# Patient Record
Sex: Male | Born: 1940 | Race: White | Hispanic: No | Marital: Married | Smoking: Former smoker
Health system: Southern US, Community
[De-identification: ages and names within clinical notes are randomized; demographics above are authoritative.]

## PROBLEM LIST (undated history)

## (undated) DIAGNOSIS — K5790 Diverticulosis of intestine, part unspecified, without perforation or abscess without bleeding: Secondary | ICD-10-CM

## (undated) DIAGNOSIS — T7840XA Allergy, unspecified, initial encounter: Secondary | ICD-10-CM

## (undated) DIAGNOSIS — I499 Cardiac arrhythmia, unspecified: Secondary | ICD-10-CM

## (undated) DIAGNOSIS — M1990 Unspecified osteoarthritis, unspecified site: Secondary | ICD-10-CM

## (undated) DIAGNOSIS — I4891 Unspecified atrial fibrillation: Secondary | ICD-10-CM

## (undated) DIAGNOSIS — N138 Other obstructive and reflux uropathy: Secondary | ICD-10-CM

## (undated) DIAGNOSIS — K4090 Unilateral inguinal hernia, without obstruction or gangrene, not specified as recurrent: Secondary | ICD-10-CM

## (undated) DIAGNOSIS — G4733 Obstructive sleep apnea (adult) (pediatric): Secondary | ICD-10-CM

## (undated) DIAGNOSIS — H269 Unspecified cataract: Secondary | ICD-10-CM

## (undated) DIAGNOSIS — E669 Obesity, unspecified: Secondary | ICD-10-CM

## (undated) DIAGNOSIS — J449 Chronic obstructive pulmonary disease, unspecified: Secondary | ICD-10-CM

## (undated) DIAGNOSIS — G4730 Sleep apnea, unspecified: Secondary | ICD-10-CM

## (undated) DIAGNOSIS — J45909 Unspecified asthma, uncomplicated: Secondary | ICD-10-CM

## (undated) DIAGNOSIS — R739 Hyperglycemia, unspecified: Secondary | ICD-10-CM

## (undated) DIAGNOSIS — G709 Myoneural disorder, unspecified: Secondary | ICD-10-CM

## (undated) DIAGNOSIS — I10 Essential (primary) hypertension: Secondary | ICD-10-CM

## (undated) DIAGNOSIS — K219 Gastro-esophageal reflux disease without esophagitis: Secondary | ICD-10-CM

## (undated) DIAGNOSIS — N401 Enlarged prostate with lower urinary tract symptoms: Secondary | ICD-10-CM

## (undated) DIAGNOSIS — E785 Hyperlipidemia, unspecified: Secondary | ICD-10-CM

## (undated) HISTORY — DX: Hyperlipidemia, unspecified: E78.5

## (undated) HISTORY — DX: Other obstructive and reflux uropathy: N13.8

## (undated) HISTORY — DX: Sleep apnea, unspecified: G47.30

## (undated) HISTORY — DX: Essential (primary) hypertension: I10

## (undated) HISTORY — PX: APPENDECTOMY: SHX54

## (undated) HISTORY — PX: POLYPECTOMY: SHX149

## (undated) HISTORY — DX: Unspecified asthma, uncomplicated: J45.909

## (undated) HISTORY — DX: Obesity, unspecified: E66.9

## (undated) HISTORY — PX: TONSILLECTOMY AND ADENOIDECTOMY: SUR1326

## (undated) HISTORY — PX: OTHER SURGICAL HISTORY: SHX169

## (undated) HISTORY — DX: Diverticulosis of intestine, part unspecified, without perforation or abscess without bleeding: K57.90

## (undated) HISTORY — DX: Unilateral inguinal hernia, without obstruction or gangrene, not specified as recurrent: K40.90

## (undated) HISTORY — PX: HAMMER TOE SURGERY: SHX385

## (undated) HISTORY — DX: Myoneural disorder, unspecified: G70.9

## (undated) HISTORY — DX: Benign prostatic hyperplasia with lower urinary tract symptoms: N40.1

## (undated) HISTORY — DX: Unspecified osteoarthritis, unspecified site: M19.90

## (undated) HISTORY — PX: COLONOSCOPY: SHX174

## (undated) HISTORY — DX: Allergy, unspecified, initial encounter: T78.40XA

## (undated) HISTORY — DX: Hyperglycemia, unspecified: R73.9

## (undated) HISTORY — DX: Gastro-esophageal reflux disease without esophagitis: K21.9

## (undated) HISTORY — PX: INGUINAL HERNIA REPAIR: SUR1180

## (undated) HISTORY — DX: Obstructive sleep apnea (adult) (pediatric): G47.33

## (undated) HISTORY — DX: Unspecified cataract: H26.9

---

## 2000-01-26 ENCOUNTER — Encounter: Payer: Self-pay | Admitting: *Deleted

## 2000-01-26 ENCOUNTER — Encounter: Admission: RE | Admit: 2000-01-26 | Discharge: 2000-01-26 | Payer: Self-pay | Admitting: *Deleted

## 2000-04-23 ENCOUNTER — Ambulatory Visit (HOSPITAL_COMMUNITY): Admission: RE | Admit: 2000-04-23 | Discharge: 2000-04-23 | Payer: Self-pay

## 2002-05-20 ENCOUNTER — Ambulatory Visit (HOSPITAL_COMMUNITY): Admission: RE | Admit: 2002-05-20 | Discharge: 2002-05-20 | Payer: Self-pay | Admitting: Orthopedic Surgery

## 2002-05-20 ENCOUNTER — Encounter: Payer: Self-pay | Admitting: Orthopedic Surgery

## 2005-03-20 HISTORY — PX: TOTAL HIP ARTHROPLASTY: SHX124

## 2005-05-31 ENCOUNTER — Inpatient Hospital Stay (HOSPITAL_COMMUNITY): Admission: RE | Admit: 2005-05-31 | Discharge: 2005-06-03 | Payer: Self-pay | Admitting: Orthopedic Surgery

## 2007-07-05 ENCOUNTER — Encounter: Admission: RE | Admit: 2007-07-05 | Discharge: 2007-07-05 | Payer: Self-pay | Admitting: Internal Medicine

## 2008-10-05 ENCOUNTER — Ambulatory Visit: Payer: Self-pay | Admitting: Vascular Surgery

## 2009-10-28 ENCOUNTER — Emergency Department (HOSPITAL_COMMUNITY): Admission: EM | Admit: 2009-10-28 | Discharge: 2009-10-28 | Payer: Self-pay | Admitting: Emergency Medicine

## 2010-03-20 HISTORY — PX: CATARACT EXTRACTION: SUR2

## 2010-08-05 NOTE — Discharge Summary (Signed)
NAME:  Noah Huffman, Noah Huffman NO.:  [id number]   MEDICAL RECORD NO.:  [id number]          PATIENT TYPE:  INP   LOCATION:  1518                         FACILITY:  [facility]   PHYSICIAN:  Ollen Gross, M.D.    DATE OF BIRTH:  01/05/1941   DATE OF ADMISSION:  05/31/2005  DATE OF DISCHARGE:  06/03/2005                                 DISCHARGE SUMMARY   ADMITTING DIAGNOSES:  1.  Osteoarthritis, right hip.  2.  Hypertension.   DISCHARGE DIAGNOSES:  1.  Osteoarthritis, right hip, status post right total hip arthroplasty.  2.  Postoperative blood loss anemia, did not require transfusion.  3.  Hypertension.   PROCEDURE:  On May 30, 2005, right total hip arthroplasty.  Surgeon:  Ollen Gross, M.D.  Assistant:  A. Perkins, PA-C.  Spinal anesthesia.  Blood loss:  500 cc.   CONSULTS:  None.   BRIEF HISTORY:  Noah Huffman is a 70 year old male with end-stage  osteoarthritis of the right hip, failed management and now presents for  total hip arthroplasty.   LABORATORY DATA:  Hemoglobin 14.9 preop with a hematocrit of 43, normal  white count 7.1.  Postop hemoglobin 11.6, stabilized at 11.7, but drifted  down further.  Last H&H was 10.7 and 30.9.  PT and PTT 13.7 and 33,  respectively.  INR 1.  Serial pro times were followed and last-noted PT and  INR 16.3 and 1.3.  Chem panel on admission all within normal limits.  Serial  CMPs were followed.  Electrolytes remained within normal limits.  Urinalysis  preop negative.  Blood group type O positive.  EKG dated May 26, 2005,  normal sinus rhythm, left axis deviation.  No old tracing to compare.  Confirmed by Dr. Jerral Bonito.  Chest x-ray May 26, 2005, heart and lungs were  unremarkable.  No active disease.  Hip films May 26, 2005, moderately  advanced degenerative changes of the right hip.  Postop hip and pelvis  films:  Right hip arthroplasty without apparent complications.   HOSPITAL COURSE:  Admitted to [facility].   Tolerated the procedure  well.  Later transferred to the recovery room and orthopedic floor for  continued postop care.  The patient was placed on PCA and p.o. analgesics  for pain control following surgery.  He did have some itching which was  possibly felt due to the PCA which was discontinued, switched over to  Vicodin.  Given Benadryl.  He had some low urinary output, so given some  fluid bolus and monitored the output.  Output responded very well, had  excellent output by the following day.  Hemovac drain which had been placed  at the time of surgery was pulled on day one.  By day two, the urine output  responded very well, had gotten up out of bed on day one.  Then, by day two,  he was ambulating 70 and 100 feet, respectively, with physical therapy.  He  did well on day two, feeling good, had been weaned over to p.o. meds.  Itching had improved.  He was ready to go  home by the following day of June 03, 2005.   DISCHARGE PLAN:  The patient was discharged home on June 03, 2005.   DISCHARGE DIAGNOSES:  Please see above.   DISCHARGE MEDICATIONS:  Coumadin, Robaxin, Vicodin.   DIET:  As tolerated.   ACTIVITY:  Partial weightbearing 25% to 50% right lower extremity.  Home  health PT and home health nursing, total hip protocol.   FOLLOWUP:  Follow up in two weeks.   DISPOSITION:  Home.   CONDITION ON DISCHARGE:  Improved.      Alexzandrew L. Julien Girt, P.A.      Ollen Gross, M.D.  Electronically Signed    ALP/MEDQ  D:  07/19/2005  T:  07/20/2005  Job:  [id number]   cc:   Gwen Pounds, MD  Fax: [phone]

## 2010-08-05 NOTE — Op Note (Signed)
NAME:  RIGO, LETTS NO.:  [id number]   MEDICAL RECORD NO.:  [id number]          PATIENT TYPE:  INP   LOCATION:  X005                         FACILITY:  [facility]   PHYSICIAN:  Ollen Gross, M.D.    DATE OF BIRTH:  Feb 12, 1941   DATE OF PROCEDURE:  05/31/2005  DATE OF DISCHARGE:                                 OPERATIVE REPORT   PREOPERATIVE DIAGNOSIS:  Osteoarthritis right hip.   POSTOPERATIVE DIAGNOSIS:  Osteoarthritis right hip.   PROCEDURE:  Right total hip arthroplasty.   SURGEON:  Ollen Gross, M.D.   ASSISTANT:  Avel Peace, PA-C.   ANESTHESIA:  Spinal.   ESTIMATED BLOOD LOSS:  500.   DRAINS:  Hemovac x1.   COMPLICATIONS:  None.   CONDITION:  Stable to recovery.   CLINICAL NOTE:  Mr. Slatter is a 70 year old male with end-stage  osteoarthritis of the right hip. He has failed nonoperative management and  presents now for total hip arthroplasty.   PROCEDURE IN DETAIL:  After successful administration of spinal anesthetic,  the patient is placed in the left lateral decubitus position with the right  side up and held with the hip positioner. The right lower extremity is  isolated from his perineum with plastic drapes and prepped and draped in the  usual sterile fashion. A standard posterolateral incision is made with a 10  blade through the subcutaneous tissue to the level of the fascia lata which  is incised in line with the skin incision. The sciatic nerve is palpated and  protected and the short rotators isolated off the femur. Capsulectomy is  performed and the hip is dislocated. The center of the femoral head is  marked and the trial prosthesis placed such that the center of the trial  head corresponds to the center of his native femoral head. The osteotomy  line is marked on the femoral neck and osteotomy made with an oscillating  saw. The femoral head is removed and then the femur retracted anteriorly to  gain acetabular exposure.   Acetabular reaming starts at 47 coursing in increments of 2 mm up to 61 mm  and then a 62 mm pinnacle acetabular shell was placed in anatomic position  and transfixed with two dome screws. A trial 36 mm neutral liner was placed.   The femur was repaired with the canal finder and irrigation. Axial reaming  is performed up to 13.5 mm, proximal reaming to a 34F oversize for an 18 and  the sleeve machined to an extra extra large. The trial sleeve is placed with  the 18 x 13 stem and a 36 plus 12 neck about 10 degrees beyond his native  anteversion which was slightly beyond neutral. A 36 plus zero head was  placed at first. The plus zero was too easy to reduce. We went on up to a +6  which lead to a very stable reduction. By placing the right leg on top of  the left, I still felt we needed a few more millimeters to get leg length  equality. We placed a +9 femoral head. With the +9,  we had great soft tissue  tension. He had great stability with full extension, full external rotation,  70 degrees flexion, 40 degrees adduction and 90 degrees of internal rotation  and 90 degrees of flexion and about 70 degrees of internal rotation. The hip  is dislocated and all trials are removed. The permanent apex hole eliminator  is placed into the acetabular shell and the permanent 36 mm neutral Ultamet  metal liner is placed. This was a metal-on-metal hip replacement. The 37F  extra extra large sleeve is placed. This is the oversize sleeve for the 30F.  The sleeve is impacted and the 18 x 13 stem with a 36 plus 12 neck is placed  again about 10 mm beyond his native anteversion. The 36 plus 9 head is  placed and the hip is reduced with the same stability parameters. The wound  was copiously irrigated with saline solution and the short rotators  reattached to the femur through drill holes. The fascia lata was closed over  a Hemovac drain with interrupted #1 Vicryl, subcu closed with #1 and 2-0  Vicryl and  subcuticular with running 4-0 Monocryl. The incision is cleaned  and dried and Steri-Strips and a bulky sterile dressing applied. The drain  is hooked to suction. He is placed into a knee immobilizer, awakened and  transported to recovery in stable condition.      Ollen Gross, M.D.  Electronically Signed     FA/MEDQ  D:  05/31/2005  T:  06/01/2005  Job:  [id number]

## 2010-08-05 NOTE — Op Note (Signed)
[facility]  Patient:    Noah Huffman, Noah Huffman                      MRN: [id number] Proc. Date: 04/23/00 Adm. Date:  [id number] Disc. Date: [id number] Attending:  Meredith Leeds                           Operative Report  PREOPERATIVE DIAGNOSIS:  Right inguinal hernia.  POSTOPERATIVE DIAGNOSIS:  Right inguinal hernia.  OPERATION:  Repair of right inguinal hernia.  SURGEON:  Zigmund Daniel, M.D.  ANESTHESIA:  Local with sedation and monitoring by anesthesia.  DESCRIPTION OF PROCEDURE:  After the patient was adequately monitored and sedated, and after routine preparation and draping of the right inguinal region, I liberally infused local 0.5% bupivacaine with epinephrine in the operative field.  As I deepened my dissection I gave more local anesthetic. He was comfortable throughout.  I made an oblique incision beginning just at the pubic tubercle, going laterally for about 5 cm; and I dissected down through the fat into the area of the external oblique and exposed it until I could identify the external ring.  I opened the external oblique in the direction of its fibers, into the external ring and mobilized and controlled the spermatic cord.  I took care to avoid injury to the ilioinguinal and iliohypogastric nerves.  I saw that he had a direct hernia, separated it from the cord.  I dissected the cord a little bit in its proximal part and excluded an indirect hernia.  I then reduced the hernia and held it in place with a plug of polypropylene mesh, held in place with a running 2-0 silk stitch.  I fashioned a patch of polypropylene mesh to fit on the inguinal floor with a slit made in it for egress of the spermatic cord.  I sewed it in place from the pubic tubercle medially and superiorly with a basting stitch in the internal oblique fascia, and laterally and inferiorly with a running stitch in the inguinal ligament.  I used a single suture to join  the tails of the mesh together just lateral to the spermatic cord.  I felt this provided an excellent hernia repair.  I used 2-0 Prolene suture to sew in the mesh.  I put in a little further local anesthetic and assured good hemostasis, then closed the wound.  I used 3-0 Vicryl to close the external oblique and subcutaneous tissues, and 4-0 Vicryl to intracuticular suture to approximate the skin; and I reinforced that with Steri-Strips.  After application of bandage, he went to PACU in stable condition. DD:  04/23/00 TD:  04/23/00 Job: [id number]

## 2010-08-05 NOTE — H&P (Signed)
NAME:  Noah Huffman, Noah Huffman NO.:  [id number]   MEDICAL RECORD NO.:  [id number]          PATIENT TYPE:  INP   LOCATION:  1518                         FACILITY:  [facility]   PHYSICIAN:  Ollen Gross, M.D.    DATE OF BIRTH:  1940/03/27   DATE OF ADMISSION:  05/31/2005  DATE OF DISCHARGE:  06/03/2005                                HISTORY & PHYSICAL   CHIEF COMPLAINT:  Right hip pain.   HISTORY OF PRESENT ILLNESS:  Patient is a 70 year old male who has had  progressively worsening problems in the right hip.  He has been seen and  found to have significant arthritis.  He has been tolerating it fairly well.  Unfortunately, now has progressed to the point where it is interfering with  his daily activities and quality of life and would like to have something  more definitive done about it.  He is known to have end-stage arthritis, and  it is felt that he would benefit from undergoing hip replacement.  The risks  and benefits discussed.  Patient was subsequently admitted to the hospital.   ALLERGIES:  No known drug allergies.   CURRENT MEDICATIONS:  Lisinopril 10 mg daily.   PAST MEDICAL HISTORY:  Hypertension.   PAST SURGICAL HISTORY:  1.  Hernia surgery.  2.  Appendectomy.  3.  Hammertoe surgery.  4.  Arthroscopic knee surgery.   SOCIAL HISTORY:  Married.  Two children.  Two grandchildren.  Works in  Advertising account executive.  Still very active with work.   FAMILY HISTORY:  Father deceased, age 77, with a questionable history of  stroke and leukemia.  Mother living, age 64, good health.   REVIEW OF SYSTEMS:  GENERAL:  No fevers, chills, night sweats.  NEURO:  No  seizures, syncope, paralysis.  RESPIRATORY:  No shortness of breath,  productive cough, or hemoptysis.  CARDIOVASCULAR:  No chest pain, angina, or  orthopnea.  GI:  No nausea, vomiting, diarrhea, or constipation.  GU:  No  dysuria, hematuria, or discharge.  MUSCULOSKELETAL:  Right hip.   PHYSICAL EXAMINATION:   VITAL SIGNS:  Pulse 64, respirations 12, blood  pressure 134/70.  GENERAL:  A 70 year old white male, well-nourished, well-developed, in no  acute distress.  Large frame.  Slightly overweight.  HEENT:  Normocephalic and atraumatic.  Pupils are round and reactive.  EOMs  intact.  Oropharynx is clear.  NECK:  Supple.  CHEST:  Clear.  HEART:  Regular rate and rhythm without murmur.  S1 and S2 noted.  ABDOMEN:  Soft, slightly round.  Bowel sounds present.  RECTAL/BREASTS/GENITALIA:  Not done.  Not pertinent to the present illness.  EXTREMITIES:  Right hip:  The right hip shows flexion to 95, internal  rotation of 10, external rotation of 20, abduction of 30, antalgic  gait/ambulation.   IMPRESSION:  1.  Osteoarthritis, right hip.  2.  Hypertension.   PLAN:  Patient admitted to [facility] to undergone a right total  hip arthroplasty.  Surgery will be performed by Dr. Trudee Grip.      Alexzandrew L. Perkins, P.A.  Ollen Gross, M.D.  Electronically Signed    ALP/MEDQ  D:  06/18/2005  T:  06/20/2005  Job:  [id number]   cc:   Gwen Pounds, MD  Fax: [phone]

## 2011-04-06 DIAGNOSIS — Z125 Encounter for screening for malignant neoplasm of prostate: Secondary | ICD-10-CM | POA: Diagnosis not present

## 2011-04-06 DIAGNOSIS — I10 Essential (primary) hypertension: Secondary | ICD-10-CM | POA: Diagnosis not present

## 2011-04-06 DIAGNOSIS — E785 Hyperlipidemia, unspecified: Secondary | ICD-10-CM | POA: Diagnosis not present

## 2011-04-06 DIAGNOSIS — K219 Gastro-esophageal reflux disease without esophagitis: Secondary | ICD-10-CM | POA: Diagnosis not present

## 2011-04-13 DIAGNOSIS — R7309 Other abnormal glucose: Secondary | ICD-10-CM | POA: Diagnosis not present

## 2011-04-13 DIAGNOSIS — Z0000 Encounter for general adult medical examination without abnormal findings: Secondary | ICD-10-CM | POA: Diagnosis not present

## 2011-04-13 DIAGNOSIS — I10 Essential (primary) hypertension: Secondary | ICD-10-CM | POA: Diagnosis not present

## 2011-04-13 DIAGNOSIS — R062 Wheezing: Secondary | ICD-10-CM | POA: Diagnosis not present

## 2011-04-14 DIAGNOSIS — Z1212 Encounter for screening for malignant neoplasm of rectum: Secondary | ICD-10-CM | POA: Diagnosis not present

## 2011-07-04 DIAGNOSIS — H04129 Dry eye syndrome of unspecified lacrimal gland: Secondary | ICD-10-CM | POA: Diagnosis not present

## 2011-08-09 ENCOUNTER — Other Ambulatory Visit (HOSPITAL_COMMUNITY): Payer: Self-pay | Admitting: Internal Medicine

## 2011-08-09 DIAGNOSIS — R062 Wheezing: Secondary | ICD-10-CM

## 2011-08-16 ENCOUNTER — Ambulatory Visit (HOSPITAL_COMMUNITY)
Admission: RE | Admit: 2011-08-16 | Discharge: 2011-08-16 | Disposition: A | Discharge status: Home / Self Care | Payer: Medicare Other | Source: Ambulatory Visit | Attending: Internal Medicine | Admitting: Internal Medicine

## 2011-08-16 ENCOUNTER — Other Ambulatory Visit (HOSPITAL_COMMUNITY): Payer: Self-pay | Admitting: Internal Medicine

## 2011-08-16 DIAGNOSIS — R062 Wheezing: Secondary | ICD-10-CM | POA: Diagnosis not present

## 2011-08-16 DIAGNOSIS — R05 Cough: Secondary | ICD-10-CM | POA: Diagnosis not present

## 2011-08-16 DIAGNOSIS — R059 Cough, unspecified: Secondary | ICD-10-CM | POA: Diagnosis not present

## 2011-08-16 MED ORDER — ALBUTEROL SULFATE (5 MG/ML) 0.5% IN NEBU
2.5000 mg | INHALATION_SOLUTION | Freq: Once | RESPIRATORY_TRACT | Status: AC
  Administered 2011-08-16: 2.5 mg via RESPIRATORY_TRACT

## 2011-08-23 DIAGNOSIS — R059 Cough, unspecified: Secondary | ICD-10-CM | POA: Diagnosis not present

## 2011-08-23 DIAGNOSIS — R062 Wheezing: Secondary | ICD-10-CM | POA: Diagnosis not present

## 2011-08-23 DIAGNOSIS — J209 Acute bronchitis, unspecified: Secondary | ICD-10-CM | POA: Diagnosis not present

## 2011-08-23 DIAGNOSIS — R05 Cough: Secondary | ICD-10-CM | POA: Diagnosis not present

## 2011-10-11 DIAGNOSIS — R7309 Other abnormal glucose: Secondary | ICD-10-CM | POA: Diagnosis not present

## 2011-10-11 DIAGNOSIS — E785 Hyperlipidemia, unspecified: Secondary | ICD-10-CM | POA: Diagnosis not present

## 2011-10-11 DIAGNOSIS — I10 Essential (primary) hypertension: Secondary | ICD-10-CM | POA: Diagnosis not present

## 2011-10-11 DIAGNOSIS — R062 Wheezing: Secondary | ICD-10-CM | POA: Diagnosis not present

## 2012-01-22 DIAGNOSIS — K219 Gastro-esophageal reflux disease without esophagitis: Secondary | ICD-10-CM | POA: Diagnosis not present

## 2012-01-22 DIAGNOSIS — I10 Essential (primary) hypertension: Secondary | ICD-10-CM | POA: Diagnosis not present

## 2012-01-22 DIAGNOSIS — J309 Allergic rhinitis, unspecified: Secondary | ICD-10-CM | POA: Diagnosis not present

## 2012-01-22 DIAGNOSIS — R05 Cough: Secondary | ICD-10-CM | POA: Diagnosis not present

## 2012-01-22 DIAGNOSIS — R059 Cough, unspecified: Secondary | ICD-10-CM | POA: Diagnosis not present

## 2012-02-22 DIAGNOSIS — I10 Essential (primary) hypertension: Secondary | ICD-10-CM | POA: Diagnosis not present

## 2012-02-22 DIAGNOSIS — J309 Allergic rhinitis, unspecified: Secondary | ICD-10-CM | POA: Diagnosis not present

## 2012-02-22 DIAGNOSIS — R05 Cough: Secondary | ICD-10-CM | POA: Diagnosis not present

## 2012-02-22 DIAGNOSIS — R059 Cough, unspecified: Secondary | ICD-10-CM | POA: Diagnosis not present

## 2012-02-22 DIAGNOSIS — K219 Gastro-esophageal reflux disease without esophagitis: Secondary | ICD-10-CM | POA: Diagnosis not present

## 2012-02-26 DIAGNOSIS — M7500 Adhesive capsulitis of unspecified shoulder: Secondary | ICD-10-CM | POA: Diagnosis not present

## 2012-04-01 DIAGNOSIS — M25569 Pain in unspecified knee: Secondary | ICD-10-CM | POA: Diagnosis not present

## 2012-04-04 DIAGNOSIS — M25569 Pain in unspecified knee: Secondary | ICD-10-CM | POA: Diagnosis not present

## 2012-04-08 DIAGNOSIS — M25569 Pain in unspecified knee: Secondary | ICD-10-CM | POA: Diagnosis not present

## 2012-04-10 DIAGNOSIS — R7309 Other abnormal glucose: Secondary | ICD-10-CM | POA: Diagnosis not present

## 2012-04-10 DIAGNOSIS — Z125 Encounter for screening for malignant neoplasm of prostate: Secondary | ICD-10-CM | POA: Diagnosis not present

## 2012-04-10 DIAGNOSIS — I10 Essential (primary) hypertension: Secondary | ICD-10-CM | POA: Diagnosis not present

## 2012-04-10 DIAGNOSIS — E785 Hyperlipidemia, unspecified: Secondary | ICD-10-CM | POA: Diagnosis not present

## 2012-04-11 DIAGNOSIS — M25569 Pain in unspecified knee: Secondary | ICD-10-CM | POA: Diagnosis not present

## 2012-04-15 DIAGNOSIS — M25569 Pain in unspecified knee: Secondary | ICD-10-CM | POA: Diagnosis not present

## 2012-04-18 ENCOUNTER — Encounter: Payer: Self-pay | Admitting: Internal Medicine

## 2012-04-18 DIAGNOSIS — Z125 Encounter for screening for malignant neoplasm of prostate: Secondary | ICD-10-CM | POA: Diagnosis not present

## 2012-04-18 DIAGNOSIS — K219 Gastro-esophageal reflux disease without esophagitis: Secondary | ICD-10-CM | POA: Diagnosis not present

## 2012-04-18 DIAGNOSIS — Z1331 Encounter for screening for depression: Secondary | ICD-10-CM | POA: Diagnosis not present

## 2012-04-18 DIAGNOSIS — Z0000 Encounter for general adult medical examination without abnormal findings: Secondary | ICD-10-CM | POA: Diagnosis not present

## 2012-04-18 DIAGNOSIS — Z1212 Encounter for screening for malignant neoplasm of rectum: Secondary | ICD-10-CM | POA: Diagnosis not present

## 2012-04-19 DIAGNOSIS — M25569 Pain in unspecified knee: Secondary | ICD-10-CM | POA: Diagnosis not present

## 2012-04-24 DIAGNOSIS — M25569 Pain in unspecified knee: Secondary | ICD-10-CM | POA: Diagnosis not present

## 2012-04-26 DIAGNOSIS — M25569 Pain in unspecified knee: Secondary | ICD-10-CM | POA: Diagnosis not present

## 2012-05-01 DIAGNOSIS — M25569 Pain in unspecified knee: Secondary | ICD-10-CM | POA: Diagnosis not present

## 2012-05-06 DIAGNOSIS — M25569 Pain in unspecified knee: Secondary | ICD-10-CM | POA: Diagnosis not present

## 2012-05-09 DIAGNOSIS — M25569 Pain in unspecified knee: Secondary | ICD-10-CM | POA: Diagnosis not present

## 2012-05-13 DIAGNOSIS — M25569 Pain in unspecified knee: Secondary | ICD-10-CM | POA: Diagnosis not present

## 2012-05-15 ENCOUNTER — Ambulatory Visit (INDEPENDENT_AMBULATORY_CARE_PROVIDER_SITE_OTHER): Payer: Medicare Other | Admitting: Internal Medicine

## 2012-05-15 ENCOUNTER — Encounter: Payer: Self-pay | Admitting: Internal Medicine

## 2012-05-15 VITALS — BP 138/88 | HR 89 | Ht 76.0 in | Wt 284.0 lb

## 2012-05-15 DIAGNOSIS — Z1211 Encounter for screening for malignant neoplasm of colon: Secondary | ICD-10-CM | POA: Diagnosis not present

## 2012-05-15 DIAGNOSIS — K219 Gastro-esophageal reflux disease without esophagitis: Secondary | ICD-10-CM | POA: Diagnosis not present

## 2012-05-15 MED ORDER — MOVIPREP 100 G PO SOLR: 1.0000 | Freq: Once | ORAL | Status: DC

## 2012-05-15 NOTE — Progress Notes (Signed)
HISTORY OF PRESENT ILLNESS:  Noah Huffman is a 72 y.o. male with hyperlipidemia, hypertension, obesity, and sleep apnea. He presents today regarding possible GERD and the need for colonoscopy. Patient reports developing issues with wheezing and upper respiratory symptoms late last fall. He was initially treated as an upper respiratory illness with improvement. Subsequently developed problems with regurgitation, pyrosis, and water brash. As well, lump-like sensation in the mid chest. He was initially placed on OTC acid reducing agents with improvement. Subsequently placed on Dexilant 60 mg daily, last month. On the medication his symptoms resolved. He has now been off the medication for 2 days. He describes thick phlegm like sensation in the throat, but no true esophageal dysphagia. Occasional nausea. No vomiting. GI review of systems is otherwise negative. He states that he had a colonoscopy about 8 or 10 years ago, at least. He is not sure where with whom. No pathology to report. His mother was diagnosed with colon cancer at advance age (43). He was advised he is due for followup colonoscopy. He is active, and his chronic medical problems stable  REVIEW OF SYSTEMS:  All non-GI ROS negative except for  Past Medical History  Diagnosis Date  . Allergic rhinitis   . Hyperglycemia   . Hyperlipidemia   . GERD (gastroesophageal reflux disease)   . BPH (benign prostatic hypertrophy) with urinary obstruction   . Hypertension   . Obesity   . Osteoarthritis   . Sleep apnea   . Inguinal hernia   . Diverticulosis     Past Surgical History  Procedure Laterality Date  . Hammer toe surgery    . Inguinal hernia repair      right  . Arthroscopic knee surgery    . Appendectomy    . Tonsillectomy and adenoidectomy    . Total hip arthroplasty  2007    right  . Cataract extraction  2012    bilateral    Social History KMARION RAWL  reports that he quit smoking about 34 years ago. His smoking  use included Cigarettes. He smoked 0.00 packs per day. He has never used smokeless tobacco. He reports that  drinks alcohol. His drug history is not on file.  family history includes Colon cancer (age of onset: 38) in his mother; Leukemia in his father; Pneumonia in his father; and Transient ischemic attack in his father.  No Known Allergies     PHYSICAL EXAMINATION: Vital signs: BP 138/88  Pulse 89  Ht 6\' 4"  (1.93 m)  Wt 284 lb (128.822 kg)  BMI 34.58 kg/m2  SpO2 98%  Constitutional: generally well-appearing, no acute distress Psychiatric: alert and oriented x3, cooperative Eyes: extraocular movements intact, anicteric, conjunctiva pink Mouth: oral pharynx moist, no lesions Neck: supple no lymphadenopathy Cardiovascular: heart regular rate and rhythm, no murmur Lungs: clear to auscultation bilaterally Abdomen: soft, nontender, nondistended, no obvious ascites, no peritoneal signs, normal bowel sounds, no organomegaly Rectal: Deferred until colonoscopy Extremities: no lower extremity edema bilaterally Skin: no lesions on visible extremities Neuro: No focal deficits. No asterixis.    ASSESSMENT:  #1. GERD. Symptoms improved on PPI. Now off PPI for 2 days #2. Family history of colon cancer. Due for followup screening   PLAN:  #1. Reflux precautions #2. Nexium samples given. A patient were to have recurrent symptoms off PPI, then resume PPI. May need prescription long-term. Await the results of endoscopy. #3. Diagnostic upper endoscopy.The nature of the procedure, as well as the risks, benefits, and alternatives were carefully  and thoroughly reviewed with the patient. Ample time for discussion and questions allowed. The patient understood, was satisfied, and agreed to proceed. #4. Screening colonoscopy.The nature of the procedure, as well as the risks, benefits, and alternatives were carefully and thoroughly reviewed with the patient. Ample time for discussion and questions  allowed. The patient understood, was satisfied, and agreed to proceed. Movi prep prescribed. The patient instructed on its use

## 2012-05-15 NOTE — Patient Instructions (Addendum)
You have been scheduled for an endoscopy and colonoscopy with propofol. Please follow the written instructions given to you at your visit today. Please pick up your prep at the pharmacy within the next 1-3 days. If you use inhalers (even only as needed) or a CPAP machine, please bring them with you on the day of your procedure.   You have been given you some samples of Nexium to use, take one a day 30 minutes before breakfast

## 2012-05-17 DIAGNOSIS — M25569 Pain in unspecified knee: Secondary | ICD-10-CM | POA: Diagnosis not present

## 2012-05-20 DIAGNOSIS — M25569 Pain in unspecified knee: Secondary | ICD-10-CM | POA: Diagnosis not present

## 2012-06-04 DIAGNOSIS — M25569 Pain in unspecified knee: Secondary | ICD-10-CM | POA: Diagnosis not present

## 2012-06-10 DIAGNOSIS — I10 Essential (primary) hypertension: Secondary | ICD-10-CM | POA: Diagnosis not present

## 2012-06-10 DIAGNOSIS — R059 Cough, unspecified: Secondary | ICD-10-CM | POA: Diagnosis not present

## 2012-06-10 DIAGNOSIS — J309 Allergic rhinitis, unspecified: Secondary | ICD-10-CM | POA: Diagnosis not present

## 2012-06-10 DIAGNOSIS — R05 Cough: Secondary | ICD-10-CM | POA: Diagnosis not present

## 2012-06-10 DIAGNOSIS — K219 Gastro-esophageal reflux disease without esophagitis: Secondary | ICD-10-CM | POA: Diagnosis not present

## 2012-06-11 DIAGNOSIS — M25569 Pain in unspecified knee: Secondary | ICD-10-CM | POA: Diagnosis not present

## 2012-06-25 DIAGNOSIS — M25569 Pain in unspecified knee: Secondary | ICD-10-CM | POA: Diagnosis not present

## 2012-06-27 DIAGNOSIS — M25569 Pain in unspecified knee: Secondary | ICD-10-CM | POA: Diagnosis not present

## 2012-07-02 ENCOUNTER — Encounter: Payer: Self-pay | Admitting: Internal Medicine

## 2012-07-02 ENCOUNTER — Ambulatory Visit (AMBULATORY_SURGERY_CENTER): Payer: Medicare Other | Admitting: Internal Medicine

## 2012-07-02 VITALS — BP 144/80 | HR 50 | Temp 97.1°F | Resp 18 | Ht 76.0 in | Wt 284.0 lb

## 2012-07-02 DIAGNOSIS — D126 Benign neoplasm of colon, unspecified: Secondary | ICD-10-CM | POA: Diagnosis not present

## 2012-07-02 DIAGNOSIS — Z1211 Encounter for screening for malignant neoplasm of colon: Secondary | ICD-10-CM

## 2012-07-02 DIAGNOSIS — E669 Obesity, unspecified: Secondary | ICD-10-CM | POA: Diagnosis not present

## 2012-07-02 DIAGNOSIS — I10 Essential (primary) hypertension: Secondary | ICD-10-CM | POA: Diagnosis not present

## 2012-07-02 DIAGNOSIS — K219 Gastro-esophageal reflux disease without esophagitis: Secondary | ICD-10-CM

## 2012-07-02 DIAGNOSIS — Z800 Family history of malignant neoplasm of digestive organs: Secondary | ICD-10-CM

## 2012-07-02 DIAGNOSIS — G4733 Obstructive sleep apnea (adult) (pediatric): Secondary | ICD-10-CM | POA: Diagnosis not present

## 2012-07-02 DIAGNOSIS — R1310 Dysphagia, unspecified: Secondary | ICD-10-CM | POA: Diagnosis not present

## 2012-07-02 MED ORDER — SODIUM CHLORIDE 0.9 % IV SOLN: 500.0000 mL | INTRAVENOUS | Status: DC

## 2012-07-02 NOTE — Op Note (Signed)
[facility] [address] [place], [address]   ENDOSCOPY PROCEDURE REPORT  PATIENT: Antwain, Caliendo  MR#: [id number] BIRTHDATE: 03-18-1941 , 71  yrs. old GENDER: Male ENDOSCOPIST: Roxy Cedar, MD REFERRED BY:  Creola Corn, M.D. PROCEDURE DATE:  07/02/2012 PROCEDURE:  EGD, diagnostic ASA CLASS:     Class II INDICATIONS:  History of esophageal reflux. MEDICATIONS: MAC sedation, administered by CRNA and propofol (Diprivan) 70mg  IV TOPICAL ANESTHETIC: Cetacaine Spray  DESCRIPTION OF PROCEDURE: After the risks benefits and alternatives of the procedure were thoroughly explained, informed consent was obtained.  The LB GIF-H180 [id number] endoscope was introduced through the mouth and advanced to the second portion of the duodenum. Without limitations.  The instrument was slowly withdrawn as the mucosa was fully examined.      The upper, middle and distal third of the esophagus were carefully inspected and no abnormalities were noted.  The z-line was well seen at the GEJ.  The endoscope was pushed into the fundus which was normal including a retroflexed view.  The antrum, gastric body, first and second part of the duodenum were unremarkable. Retroflexed views revealed no abnormalities.     The scope was then withdrawn from the patient and the procedure completed.  COMPLICATIONS: There were no complications. ENDOSCOPIC IMPRESSION: 1. Normal EGD 2. GERD  RECOMMENDATIONS: 1.  Anti-reflux regimen to be followed 2.  Prilosec OTC or Zantac if needed for relux symptoms  REPEAT EXAM:  eSigned:  Roxy Cedar, MD 07/02/2012 11:15 AM   XB:JYNW Timothy Lasso, MD and The Patient

## 2012-07-02 NOTE — Progress Notes (Signed)
Called to room to assist during endoscopic procedure.  Patient ID and intended procedure confirmed with present staff. Received instructions for my participation in the procedure from the performing physician.  

## 2012-07-02 NOTE — Progress Notes (Signed)
Patient did not experience any of the following events: a burn prior to discharge; a fall within the facility; wrong site/side/patient/procedure/implant event; or a hospital transfer or hospital admission upon discharge from the facility. (G8907) Patient did not have preoperative order for IV antibiotic SSI prophylaxis. (G8918)  

## 2012-07-02 NOTE — Op Note (Signed)
[facility] [address] [place], [address]   COLONOSCOPY PROCEDURE REPORT  PATIENT: Noah Huffman, Noah Huffman  MR#: [id number] BIRTHDATE: [birth year] , 71  yrs. old GENDER: Male ENDOSCOPIST: Roxy Cedar, MD REFERRED XB:JYNW Timothy Lasso, M.D. PROCEDURE DATE:  07/02/2012 PROCEDURE:   Colonoscopy with snare polypectomy x 3 ASA CLASS:   Class II INDICATIONS:Patient's immediate family history of colon cancer (Mom - 93); prior exam elswhere 8-10 yrs ago (no details). MEDICATIONS: MAC sedation, administered by CRNA and propofol (Diprivan) 200mg  IV  DESCRIPTION OF PROCEDURE:   After the risks benefits and alternatives of the procedure were thoroughly explained, informed consent was obtained.  A digital rectal exam revealed no abnormalities of the rectum.   The LB CF-H180AL [id number]  endoscope was introduced through the anus and advanced to the cecum, which was identified by both the appendix and ileocecal valve. No adverse events experienced.   The quality of the prep was excellent, using MoviPrep  The instrument was then slowly withdrawn as the colon was fully examined.      COLON FINDINGS: Three polyps ranging between 3-49mm in size were found in the descending colon and sigmoid colon.  A polypectomy was performed with a cold snare.  The resection was complete and the polyp tissue was completely retrieved.   Mild diverticulosis was noted in the sigmoid colon.   The colon mucosa was otherwise normal.  Retroflexed views revealed internal hemorrhoids. The time to cecum=2 minutes 30 seconds.  Withdrawal time=13 minutes 33 seconds.  The scope was withdrawn and the procedure completed. COMPLICATIONS: There were no complications.  ENDOSCOPIC IMPRESSION: 1.   Three polyps ranging between 3-37mm in size were found in the descending (2) and sigmoid colon; polypectomy was performed with a cold snare 2.   Mild diverticulosis was noted in the sigmoid colon 3.   The colon mucosa was  otherwise normal  RECOMMENDATIONS: 1. Repeat colonoscopy in 5 years if polyp adenomatous; otherwise 10 years   eSigned:  Roxy Cedar, MD 07/02/2012 11:12 AM  cc: Creola Corn, MD and The Patient   PATIENT NAME:  Noah Huffman, Noah Huffman MR#: [id number]

## 2012-07-02 NOTE — Progress Notes (Signed)
Lidocaine-40mg IV prior to Propofol InductionPropofol given over incremental dosagesPropofol given over incremental dosages 

## 2012-07-02 NOTE — Patient Instructions (Addendum)
YOU HAD AN ENDOSCOPIC PROCEDURE TODAY AT THE [facility]: Refer to the procedure report that was given to you for any specific questions about what was found during the examination.  If the procedure report does not answer your questions, please call your gastroenterologist to clarify.  If you requested that your care partner not be given the details of your procedure findings, then the procedure report has been included in a sealed envelope for you to review at your convenience later.  YOU SHOULD EXPECT: Some feelings of bloating in the abdomen. Passage of more gas than usual.  Walking can help get rid of the air that was put into your GI tract during the procedure and reduce the bloating. If you had a lower endoscopy (such as a colonoscopy or flexible sigmoidoscopy) you may notice spotting of blood in your stool or on the toilet paper. If you underwent a bowel prep for your procedure, then you may not have a normal bowel movement for a few days.  DIET: Your first meal following the procedure should be a light meal and then it is ok to progress to your normal diet.  A half-sandwich or bowl of soup is an example of a good first meal.  Heavy or fried foods are harder to digest and may make you feel nauseous or bloated.  Likewise meals heavy in dairy and vegetables can cause extra gas to form and this can also increase the bloating.  Drink plenty of fluids but you should avoid alcoholic beverages for 24 hours.  ACTIVITY: Your care partner should take you home directly after the procedure.  You should plan to take it easy, moving slowly for the rest of the day.  You can resume normal activity the day after the procedure however you should NOT DRIVE or use heavy machinery for 24 hours (because of the sedation medicines used during the test).    SYMPTOMS TO REPORT IMMEDIATELY: A gastroenterologist can be reached at any hour.  During normal business hours, 8:30 AM to 5:00 PM Monday through Friday,  call [phone].  After hours and on weekends, please call the GI answering service at [phone] who will take a message and have the physician on call contact you.   Following lower endoscopy (colonoscopy or flexible sigmoidoscopy):  Excessive amounts of blood in the stool  Significant tenderness or worsening of abdominal pains  Swelling of the abdomen that is new, acute  Fever of 100F or higher  Following upper endoscopy (EGD)  Vomiting of blood or coffee ground material  New chest pain or pain under the shoulder blades  Painful or persistently difficult swallowing  New shortness of breath  Fever of 100F or higher  Black, tarry-looking stools  FOLLOW UP: If any biopsies were taken you will be contacted by phone or by letter within the next 1-3 weeks.  Call your gastroenterologist if you have not heard about the biopsies in 3 weeks.  Our staff will call the home number listed on your records the next business day following your procedure to check on you and address any questions or concerns that you may have at that time regarding the information given to you following your procedure. This is a courtesy call and so if there is no answer at the home number and we have not heard from you through the emergency physician on call, we will assume that you have returned to your regular daily activities without incident.  SIGNATURES/CONFIDENTIALITY: You and/or your care   partner have signed paperwork which will be entered into your electronic medical record.  These signatures attest to the fact that that the information above on your After Visit Summary has been reviewed and is understood.  Full responsibility of the confidentiality of this discharge information lies with you and/or your care-partner.  

## 2012-07-03 ENCOUNTER — Telehealth: Payer: Self-pay

## 2012-07-03 NOTE — Telephone Encounter (Signed)
Left a message at [phone] for the pt to call if any questions or concerns. Maw

## 2012-07-09 ENCOUNTER — Telehealth: Payer: Self-pay | Admitting: Internal Medicine

## 2012-07-09 ENCOUNTER — Encounter: Payer: Self-pay | Admitting: Internal Medicine

## 2012-07-09 NOTE — Telephone Encounter (Signed)
Dr. Jonny Ruiz Russo's Office requested Consult Note from 05/15/2012 on 07/01/2012  Faxed 4 pages Office Note from 05/15/2012 on 07/02/2012  asw

## 2012-08-01 DIAGNOSIS — H43819 Vitreous degeneration, unspecified eye: Secondary | ICD-10-CM | POA: Diagnosis not present

## 2012-08-13 ENCOUNTER — Telehealth: Payer: Self-pay | Admitting: Internal Medicine

## 2012-08-13 NOTE — Telephone Encounter (Signed)
Left message for pt to call back  °

## 2012-08-15 NOTE — Telephone Encounter (Signed)
Pt called and wanted path results, states he did not get a letter. Reviewed results with pt and he requested they be mailed. Reports and letter sent to pt.

## 2012-08-27 ENCOUNTER — Encounter: Payer: Self-pay | Admitting: Pulmonary Disease

## 2012-09-03 ENCOUNTER — Institutional Professional Consult (permissible substitution): Payer: Medicare Other | Admitting: Pulmonary Disease

## 2012-09-19 ENCOUNTER — Institutional Professional Consult (permissible substitution): Payer: Medicare Other | Admitting: Internal Medicine

## 2012-10-02 ENCOUNTER — Ambulatory Visit (INDEPENDENT_AMBULATORY_CARE_PROVIDER_SITE_OTHER)
Admission: RE | Admit: 2012-10-02 | Discharge: 2012-10-02 | Disposition: A | Discharge status: Home / Self Care | Payer: Medicare Other | Source: Ambulatory Visit | Attending: Internal Medicine | Admitting: Internal Medicine

## 2012-10-02 ENCOUNTER — Ambulatory Visit (INDEPENDENT_AMBULATORY_CARE_PROVIDER_SITE_OTHER): Payer: Medicare Other | Admitting: Internal Medicine

## 2012-10-02 ENCOUNTER — Encounter: Payer: Self-pay | Admitting: Internal Medicine

## 2012-10-02 VITALS — BP 140/80 | HR 63 | Temp 97.1°F | Ht 76.0 in | Wt 277.0 lb

## 2012-10-02 DIAGNOSIS — J40 Bronchitis, not specified as acute or chronic: Secondary | ICD-10-CM | POA: Diagnosis not present

## 2012-10-02 DIAGNOSIS — R059 Cough, unspecified: Secondary | ICD-10-CM | POA: Insufficient documentation

## 2012-10-02 DIAGNOSIS — R05 Cough: Secondary | ICD-10-CM | POA: Insufficient documentation

## 2012-10-02 DIAGNOSIS — J9819 Other pulmonary collapse: Secondary | ICD-10-CM | POA: Diagnosis not present

## 2012-10-02 DIAGNOSIS — I10 Essential (primary) hypertension: Secondary | ICD-10-CM | POA: Diagnosis not present

## 2012-10-02 MED ORDER — OMEPRAZOLE MAGNESIUM 20 MG PO TBEC: DELAYED_RELEASE_TABLET | ORAL | Status: DC

## 2012-10-02 MED ORDER — OLMESARTAN MEDOXOMIL 20 MG PO TABS: 20.0000 mg | ORAL_TABLET | Freq: Every day | ORAL | Status: DC

## 2012-10-02 MED ORDER — RANITIDINE HCL 150 MG PO TABS: 150.0000 mg | ORAL_TABLET | Freq: Every day | ORAL | Status: DC

## 2012-10-02 NOTE — Progress Notes (Signed)
  Subjective:    Patient ID: Noah Huffman, male    DOB: 10-12-1940  MRN: [id number]  HPI  72 yowm with smoker's cough which resolved completely  in 1990 then around 2012 onset cough and wheeze no better with inhaler referred by Dr Timothy Lasso to pulmonary clinic for persistent symptoms of chest congestion and pain with cough.  10/02/2012 1st pulmonary eval on acei cc 2 years of almost daily cough and wheeze and congestion not responding to max rx for asthma and better but not resolved after eval and rx by Marina Goodell with symptomatic hb s/p nl egd 07/02/12.  Cough is worst in ams > min mucoid sputum production, sometimes ex limited by doe. Sense of chest pain = chest congestion like I need to cough something up, center of chest, not pleuritic or exertional  No obvious daytime variabilty or assoc   cp or chest tightness, subjective wheeze overt sinus or hb symptoms. No unusual exp hx or h/o childhood pna/ asthma or knowledge of premature birth.   Sleeping ok without nocturnal  or early am exacerbation  of respiratory  c/o's or need for noct saba. Also denies any obvious fluctuation of symptoms with weather or environmental changes or other aggravating or alleviating factors except as outlined above      Review of Systems  Constitutional: Negative for fever, chills, activity change, appetite change and unexpected weight change.  HENT: Negative for congestion, sore throat, rhinorrhea, sneezing, trouble swallowing, dental problem, voice change and postnasal drip.   Eyes: Negative for visual disturbance.  Respiratory: Positive for cough and shortness of breath. Negative for choking.   Cardiovascular: Positive for chest pain. Negative for leg swelling.  Gastrointestinal: Negative for nausea, vomiting and abdominal pain.  Genitourinary: Negative for difficulty urinating.  Musculoskeletal: Positive for arthralgias.  Skin: Negative for rash.  Psychiatric/Behavioral: Negative for behavioral problems and  confusion.       Objective:   Physical Exam Wt Readings from Last 3 Encounters:  10/02/12 277 lb (125.646 kg)  07/02/12 284 lb (128.822 kg)  05/15/12 284 lb (128.822 kg)    amb hoarse wm nad   HEENT: nl dentition, turbinates, and orophanx. Nl external ear canals without cough reflex   NECK :  without JVD/Nodes/TM/ nl carotid upstrokes bilaterally   LUNGS: no acc muscle use, clear to A and P bilaterally without cough on insp or exp maneuvers   CV:  RRR  no s3 or murmur or increase in P2, no edema   ABD:  soft and nontender with nl excursion in the supine position. No bruits or organomegaly, bowel sounds nl  MS:  warm without deformities, calf tenderness, cyanosis or clubbing  SKIN: warm and dry without lesions    NEURO:  alert, approp, no deficits    CXR  10/02/2012 :  Upper normal heart size. Minimal atherosclerotic calcification tortuosity thoracic aorta. Slightly prominent central pulmonary arteries stable. Peribronchial thickening with accentuation of perihilar markings similar to previous exam. Streaky atelectasis right base. On the lateral view, increased infrahilar opacity is seen, suspect related to right basilar atelectasis identified on the PA view. No definite acute infiltrate, pleural effusion or pneumothorax. Bones unremarkable.  IMPRESSION: Bronchitic changes with right basilar atelectasis.     Assessment & Plan:

## 2012-10-02 NOTE — Patient Instructions (Addendum)
Stop lisinopril until further notice Start benicar 20 mg one daily   Try prilosec 20mg   Take 30-60 min before first meal of the day and Zantac 150  one bedtime until return  GERD (REFLUX)  is an extremely common cause of respiratory symptoms just like yours, many times with no significant heartburn at all.    It can be treated with medication, but also with lifestyle changes including avoidance of late meals, excessive alcohol, smoking cessation, and avoid fatty foods, chocolate, peppermint, colas, red wine, and acidic juices such as orange juice.  NO MINT OR MENTHOL PRODUCTS SO NO COUGH DROPS  USE SUGARLESS CANDY INSTEAD (jolley ranchers or Stover's)  NO OIL BASED VITAMINS - use powdered substitutes.  Suppress the urge to cough with delsym 2 tsp every 12 hours as needed  Only use symbicort as needed up to 2 puffs every 12 hours for breathing or coughing  Please remember to go to the x-ray department downstairs for your tests - we will call you with the results when they are available.     Please schedule a follow up office visit in 4 weeks, sooner if needed

## 2012-10-03 ENCOUNTER — Encounter: Payer: Self-pay | Admitting: Internal Medicine

## 2012-10-03 DIAGNOSIS — I10 Essential (primary) hypertension: Secondary | ICD-10-CM | POA: Insufficient documentation

## 2012-10-03 NOTE — Assessment & Plan Note (Addendum)
ACE inhibitors are problematic in  pts with airway complaints because  even experienced pulmonologists can't always distinguish ace effects from copd/asthma/pnds/ allergies etc.  By themselves they don't actually cause a problem, much like oxygen can't by itself start a fire, but they certainly serve as a powerful catalyst or enhancer for any "fire"  or inflammatory process in the upper airway, be it caused by an ET  tube or more commonly reflux (especially in the obese or pts with known GERD or who are on biphoshonates) or URI's, due to interference with bradykinin clearance.  The effects of acei on bradykinin levels occurs in 100% of pt's on acei (unless they surreptitiously stop the med!) but the classic cough is only reported in 5%.  This leaves 95% of pts on acei's  with a variety of syndromes including no identifiable symptom in most  vs non-specific symptoms that wax and wane depending on what other insult is occuring at the level of the upper airway, like low grade GERD    rec change to benicar 40 mg daily then regroup in 4 weeks

## 2012-10-03 NOTE — Assessment & Plan Note (Signed)
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   These are the same pts (now being labeled as having "irritable larynx syndrome" by some cough centers) who not infrequently have a history of having failed to tolerate ace inhibitors,  dry powder inhalers or biphosphonates or report having atypical reflux symptoms that don't respond to standard doses of PPI , and are easily confused as having aecopd or asthma flares by even experienced allergists/ pulmonologists.  rec trial off acei then regroup in 4-6 weeks on gerd rx and diet

## 2012-10-04 ENCOUNTER — Telehealth: Payer: Self-pay | Admitting: Internal Medicine

## 2012-10-04 NOTE — Telephone Encounter (Signed)
Notes Recorded by Nyoka Cowden, MD on 10/03/2012 at 1:34 PM Call pt: Reviewed cxr and no acute change so no change in recommendations made at ov ( I don't think there is any change in cxr since a year ago)   I spoke with patient about results and he verbalized understanding and had no questions

## 2012-10-14 ENCOUNTER — Telehealth: Payer: Self-pay | Admitting: Internal Medicine

## 2012-10-14 MED ORDER — OLMESARTAN MEDOXOMIL 20 MG PO TABS: 20.0000 mg | ORAL_TABLET | Freq: Every day | ORAL | Status: DC

## 2012-10-14 NOTE — Telephone Encounter (Signed)
rx sent and pt is aware. Jennifer Castillo, CMA  

## 2012-10-16 ENCOUNTER — Telehealth: Payer: Self-pay | Admitting: Internal Medicine

## 2012-10-16 MED ORDER — VALSARTAN 160 MG PO TABS: 160.0000 mg | ORAL_TABLET | Freq: Every day | ORAL | Status: DC

## 2012-10-16 NOTE — Telephone Encounter (Signed)
Diovan 160mg  #30 Take 1 daily x 6 refills sent to pharmacy [facility]. LMOM x 1 for pt to return call to make aware of new Rx

## 2012-10-16 NOTE — Telephone Encounter (Signed)
Try diovan 160 one daily

## 2012-10-16 NOTE — Telephone Encounter (Signed)
Pt is currently taking Benicar 20mg  daily.  MW - please advise. Thanks.

## 2012-10-17 NOTE — Telephone Encounter (Signed)
Called, spoke with pt.  Informed him to try diovan 160 mg in place of benicar 20 mg.  Advised rx was sent to [facility] and to monitor BP with this change of meds.  He verbalized understanding and voiced no further questions or concerns at this time.

## 2012-10-17 NOTE — Telephone Encounter (Signed)
Returning call can be reached at [phone].Raylene Everts

## 2012-10-24 ENCOUNTER — Telehealth: Payer: Self-pay | Admitting: Internal Medicine

## 2012-10-24 NOTE — Telephone Encounter (Signed)
Called and spoke with pt and he stated that the benicar is not covered by his insurance and this caused him to have headaches and head pressure.  Pt was changed to diovan 160 mg daily about 1 week ago.  He stated that he is still having the headaches and pressure in his head.  He has started checking his BP at home and the readings have been  190/85,  160's/78-80"s  And one night it was 137/72.  Pt is just concerned since his BP before on the lisinopril was 130's/70's.   Pt wanted to see what MW recs are.  Please advise.  Thanks   Allergies  Allergen Reactions  . Morphine And Related Rash

## 2012-10-24 NOTE — Telephone Encounter (Signed)
Increase diovan to 160 2 daily x one week then return to see [name] NP for recheck bp and decide whether to change to alternative rx  Really prefer not to go back on lisinopril for at least one month (preferably 6 weeks)  to see what difference if any it makes in his resp symptoms

## 2012-10-25 NOTE — Telephone Encounter (Signed)
Spoke with patient-- Advised of recs per Dr. Sherene Sires as listed below. Patient is going out of town the week after increasing medication Pt already scheduled to see Dr. Sherene Sires Aug 25 will keep this appt and call if anything else changes Nothing further needed at this time

## 2012-11-11 ENCOUNTER — Ambulatory Visit (INDEPENDENT_AMBULATORY_CARE_PROVIDER_SITE_OTHER): Payer: Medicare Other | Admitting: Internal Medicine

## 2012-11-11 ENCOUNTER — Encounter: Payer: Self-pay | Admitting: Internal Medicine

## 2012-11-11 VITALS — BP 140/82 | HR 66 | Temp 97.3°F | Ht 75.5 in | Wt 278.0 lb

## 2012-11-11 DIAGNOSIS — R059 Cough, unspecified: Secondary | ICD-10-CM

## 2012-11-11 DIAGNOSIS — R05 Cough: Secondary | ICD-10-CM

## 2012-11-11 DIAGNOSIS — J449 Chronic obstructive pulmonary disease, unspecified: Secondary | ICD-10-CM

## 2012-11-11 DIAGNOSIS — I10 Essential (primary) hypertension: Secondary | ICD-10-CM | POA: Diagnosis not present

## 2012-11-11 NOTE — Patient Instructions (Addendum)
Continue diovan pending your visit with Dr Timothy Lasso and he can take over the follow up  Continue the acid suppression until you have no am cough at all  Only use symbicort if you need it for your breathing but your lung function off it today is very good  If still having symptoms in one month I might consider CT sinus and chest but defer this issue to Dr Ferd Hibbs capable hands

## 2012-11-11 NOTE — Progress Notes (Signed)
Subjective:    Patient ID: Noah Huffman, male    DOB: Aug 04, 1940  MRN: [id number]   Brief patient profile:  41 yowm with smoker's cough which resolved completely  in 1990 then around 2012 onset cough and wheeze no better with inhaler referred by Dr Timothy Lasso to pulmonary clinic for persistent symptoms of chest congestion and pain with cough. Proved to have minimal airflow obst by spirometry 11/11/12 while on symbicort    HPI 10/02/2012 1st pulmonary eval on acei cc 2 years of almost daily cough and wheeze and congestion not responding to max rx for asthma and better but not resolved after eval and rx by Marina Goodell with symptomatic hb s/p nl egd 07/02/12.  Cough is worst in ams > min mucoid sputum production, sometimes ex limited by doe. Sense of chest pain = chest congestion like I need to cough something up, center of chest, not pleuritic or exertional rec Stop lisinopril until further notice Start benicar 20 mg one daily  Try prilosec 20mg   Take 30-60 min before first meal of the day and Zantac 150  one bedtime until return GERD (REFLUX)  diet Suppress the urge to cough with delsym 2 tsp every 12 hours as needed Only use symbicort as needed up to 2 puffs every 12 hours for breathing or coughing    11/11/2012 f/u ov/Wert re cough/ congestion Chief Complaint  Patient presents with  . Follow-up    Pt states breathing unchanged since his last visit. Cough is some better, but still reports occ throat clearing.    coughing is once in am > beige less than a tsp and gone for the rest of day  Doe x one flight of steps symbiocrt seems to help, does ok with yard work.   No obvious daytime variabilty or assoc chest tightness, subjective wheeze overt sinus or hb symptoms. No unusual exp hx or h/o childhood pna/ asthma or knowledge of premature birth.   Sleeping ok without nocturnal  or early am exacerbation  of respiratory  c/o's or need for noct saba. Also denies any obvious fluctuation of symptoms with  weather or environmental changes or other aggravating or alleviating factors except as outlined above   Current Medications, Allergies, Past Medical History, Past Surgical History, Family History, and Social History were reviewed in [employer] record.  ROS  The following are not active complaints unless bolded sore throat, dysphagia, dental problems, itching, sneezing,  nasal congestion or excess/ purulent secretions, ear ache,   fever, chills, sweats, unintended wt loss, pleuritic or exertional cp, hemoptysis,  orthopnea pnd or leg swelling, presyncope, palpitations, heartburn, abdominal pain, anorexia, nausea, vomiting, diarrhea  or change in bowel or urinary habits, change in stools or urine, dysuria,hematuria,  rash, arthralgias, visual complaints, headache, numbness weakness or ataxia or problems with walking or coordination,  change in mood/affect or memory.               Objective:   Physical Exam  11/11/2012       278  Wt Readings from Last 3 Encounters:  10/02/12 277 lb (125.646 kg)  07/02/12 284 lb (128.822 kg)  05/15/12 284 lb (128.822 kg)    amb hoarse wm nad   HEENT: nl dentition, turbinates, and orophanx. Nl external ear canals without cough reflex   NECK :  without JVD/Nodes/TM/ nl carotid upstrokes bilaterally   LUNGS: no acc muscle use, clear to A and P bilaterally without cough on insp or exp maneuvers  CV:  RRR  no s3 or murmur or increase in P2, no edema   ABD:  soft and nontender with nl excursion in the supine position. No bruits or organomegaly, bowel sounds nl  MS:  warm without deformities, calf tenderness, cyanosis or clubbing  SKIN: warm and dry without lesions    NEURO:  alert, approp, no deficits    CXR  10/02/2012 :  Upper normal heart size. Minimal atherosclerotic calcification tortuosity thoracic aorta. Slightly prominent central pulmonary arteries stable. Peribronchial thickening with accentuation of perihilar  markings similar to previous exam. Streaky atelectasis right base. On the lateral view, increased infrahilar opacity is seen, suspect related to right basilar atelectasis identified on the PA view. No definite acute infiltrate, pleural effusion or pneumothorax. Bones unremarkable.  IMPRESSION: Bronchitic changes with right basilar atelectasis.     Assessment & Plan:

## 2012-11-13 DIAGNOSIS — J449 Chronic obstructive pulmonary disease, unspecified: Secondary | ICD-10-CM | POA: Insufficient documentation

## 2012-11-13 NOTE — Assessment & Plan Note (Signed)
-   spirometry 11/11/12  FEV1 2.72  68% with ratio 68  With an fev1 >> 2 liters most of his sob is likely obesity / deconditioning but could have an asthmatic component so ok with me to use symbicort if he feels it really helps cough or doe  Pulmonary f/u is prn

## 2012-11-13 NOTE — Assessment & Plan Note (Signed)
Adequate control on present rx, reviewed > no change in rx needed  (off acei indefinitely to avoid confusion re interpretation of nonspecific pulmonary symptoms)

## 2012-11-14 DIAGNOSIS — R05 Cough: Secondary | ICD-10-CM | POA: Diagnosis not present

## 2012-11-14 DIAGNOSIS — J309 Allergic rhinitis, unspecified: Secondary | ICD-10-CM | POA: Diagnosis not present

## 2012-11-14 DIAGNOSIS — I10 Essential (primary) hypertension: Secondary | ICD-10-CM | POA: Diagnosis not present

## 2012-11-14 DIAGNOSIS — R062 Wheezing: Secondary | ICD-10-CM | POA: Diagnosis not present

## 2012-11-14 DIAGNOSIS — R059 Cough, unspecified: Secondary | ICD-10-CM | POA: Diagnosis not present

## 2012-11-14 DIAGNOSIS — E785 Hyperlipidemia, unspecified: Secondary | ICD-10-CM | POA: Diagnosis not present

## 2012-11-14 DIAGNOSIS — K219 Gastro-esophageal reflux disease without esophagitis: Secondary | ICD-10-CM | POA: Diagnosis not present

## 2012-11-14 DIAGNOSIS — J449 Chronic obstructive pulmonary disease, unspecified: Secondary | ICD-10-CM | POA: Diagnosis not present

## 2012-11-14 DIAGNOSIS — R7309 Other abnormal glucose: Secondary | ICD-10-CM | POA: Diagnosis not present

## 2012-12-18 DIAGNOSIS — I10 Essential (primary) hypertension: Secondary | ICD-10-CM | POA: Diagnosis not present

## 2013-02-28 ENCOUNTER — Ambulatory Visit
Admission: RE | Admit: 2013-02-28 | Discharge: 2013-02-28 | Disposition: A | Discharge status: Home / Self Care | Payer: Medicare Other | Source: Ambulatory Visit | Attending: Internal Medicine | Admitting: Internal Medicine

## 2013-02-28 ENCOUNTER — Other Ambulatory Visit: Payer: Self-pay | Admitting: Internal Medicine

## 2013-02-28 DIAGNOSIS — R9431 Abnormal electrocardiogram [ECG] [EKG]: Secondary | ICD-10-CM | POA: Diagnosis not present

## 2013-02-28 DIAGNOSIS — E669 Obesity, unspecified: Secondary | ICD-10-CM | POA: Diagnosis not present

## 2013-02-28 DIAGNOSIS — R0609 Other forms of dyspnea: Secondary | ICD-10-CM

## 2013-02-28 DIAGNOSIS — J984 Other disorders of lung: Secondary | ICD-10-CM | POA: Diagnosis not present

## 2013-02-28 DIAGNOSIS — K219 Gastro-esophageal reflux disease without esophagitis: Secondary | ICD-10-CM | POA: Diagnosis not present

## 2013-02-28 DIAGNOSIS — J449 Chronic obstructive pulmonary disease, unspecified: Secondary | ICD-10-CM | POA: Diagnosis not present

## 2013-02-28 DIAGNOSIS — R0989 Other specified symptoms and signs involving the circulatory and respiratory systems: Secondary | ICD-10-CM | POA: Diagnosis not present

## 2013-02-28 DIAGNOSIS — I10 Essential (primary) hypertension: Secondary | ICD-10-CM | POA: Diagnosis not present

## 2013-02-28 DIAGNOSIS — G4733 Obstructive sleep apnea (adult) (pediatric): Secondary | ICD-10-CM | POA: Diagnosis not present

## 2013-02-28 MED ORDER — IOHEXOL 350 MG/ML SOLN
100.0000 mL | Freq: Once | INTRAVENOUS | Status: AC | PRN
  Administered 2013-02-28: 100 mL via INTRAVENOUS

## 2013-03-17 ENCOUNTER — Other Ambulatory Visit (HOSPITAL_COMMUNITY): Payer: Self-pay | Admitting: Cardiology

## 2013-03-17 ENCOUNTER — Ambulatory Visit (HOSPITAL_COMMUNITY): Payer: Medicare Other | Attending: Cardiology | Admitting: Cardiology

## 2013-03-17 ENCOUNTER — Encounter: Payer: Self-pay | Admitting: Cardiovascular Disease

## 2013-03-17 DIAGNOSIS — J4489 Other specified chronic obstructive pulmonary disease: Secondary | ICD-10-CM | POA: Insufficient documentation

## 2013-03-17 DIAGNOSIS — I059 Rheumatic mitral valve disease, unspecified: Secondary | ICD-10-CM | POA: Diagnosis not present

## 2013-03-17 DIAGNOSIS — R0609 Other forms of dyspnea: Secondary | ICD-10-CM

## 2013-03-17 DIAGNOSIS — Z6834 Body mass index (BMI) 34.0-34.9, adult: Secondary | ICD-10-CM | POA: Insufficient documentation

## 2013-03-17 DIAGNOSIS — J449 Chronic obstructive pulmonary disease, unspecified: Secondary | ICD-10-CM | POA: Insufficient documentation

## 2013-03-17 DIAGNOSIS — I079 Rheumatic tricuspid valve disease, unspecified: Secondary | ICD-10-CM | POA: Insufficient documentation

## 2013-03-17 DIAGNOSIS — R9431 Abnormal electrocardiogram [ECG] [EKG]: Secondary | ICD-10-CM | POA: Diagnosis not present

## 2013-03-17 DIAGNOSIS — I359 Nonrheumatic aortic valve disorder, unspecified: Secondary | ICD-10-CM | POA: Insufficient documentation

## 2013-03-17 DIAGNOSIS — E669 Obesity, unspecified: Secondary | ICD-10-CM | POA: Insufficient documentation

## 2013-03-17 DIAGNOSIS — R0602 Shortness of breath: Secondary | ICD-10-CM

## 2013-03-17 NOTE — Progress Notes (Signed)
Echo performed. 

## 2013-04-03 ENCOUNTER — Ambulatory Visit (HOSPITAL_BASED_OUTPATIENT_CLINIC_OR_DEPARTMENT_OTHER): Payer: Medicare Other

## 2013-04-14 DIAGNOSIS — R7309 Other abnormal glucose: Secondary | ICD-10-CM | POA: Diagnosis not present

## 2013-04-14 DIAGNOSIS — E785 Hyperlipidemia, unspecified: Secondary | ICD-10-CM | POA: Diagnosis not present

## 2013-04-14 DIAGNOSIS — Z125 Encounter for screening for malignant neoplasm of prostate: Secondary | ICD-10-CM | POA: Diagnosis not present

## 2013-04-14 DIAGNOSIS — R82998 Other abnormal findings in urine: Secondary | ICD-10-CM | POA: Diagnosis not present

## 2013-04-14 DIAGNOSIS — I10 Essential (primary) hypertension: Secondary | ICD-10-CM | POA: Diagnosis not present

## 2013-04-21 DIAGNOSIS — Z0000 Encounter for general adult medical examination without abnormal findings: Secondary | ICD-10-CM | POA: Diagnosis not present

## 2013-04-21 DIAGNOSIS — N138 Other obstructive and reflux uropathy: Secondary | ICD-10-CM | POA: Diagnosis not present

## 2013-04-21 DIAGNOSIS — R7309 Other abnormal glucose: Secondary | ICD-10-CM | POA: Diagnosis not present

## 2013-04-21 DIAGNOSIS — I10 Essential (primary) hypertension: Secondary | ICD-10-CM | POA: Diagnosis not present

## 2013-04-21 DIAGNOSIS — E785 Hyperlipidemia, unspecified: Secondary | ICD-10-CM | POA: Diagnosis not present

## 2013-04-21 DIAGNOSIS — Z125 Encounter for screening for malignant neoplasm of prostate: Secondary | ICD-10-CM | POA: Diagnosis not present

## 2013-04-21 DIAGNOSIS — N401 Enlarged prostate with lower urinary tract symptoms: Secondary | ICD-10-CM | POA: Diagnosis not present

## 2013-04-21 DIAGNOSIS — Z1331 Encounter for screening for depression: Secondary | ICD-10-CM | POA: Diagnosis not present

## 2013-04-21 DIAGNOSIS — I2789 Other specified pulmonary heart diseases: Secondary | ICD-10-CM | POA: Diagnosis not present

## 2013-04-21 DIAGNOSIS — R0609 Other forms of dyspnea: Secondary | ICD-10-CM | POA: Diagnosis not present

## 2013-04-21 DIAGNOSIS — Z1212 Encounter for screening for malignant neoplasm of rectum: Secondary | ICD-10-CM | POA: Diagnosis not present

## 2013-04-21 DIAGNOSIS — J449 Chronic obstructive pulmonary disease, unspecified: Secondary | ICD-10-CM | POA: Diagnosis not present

## 2013-04-27 ENCOUNTER — Ambulatory Visit (HOSPITAL_BASED_OUTPATIENT_CLINIC_OR_DEPARTMENT_OTHER): Payer: Medicare Other | Attending: Internal Medicine

## 2013-04-27 VITALS — Ht 76.0 in | Wt 290.0 lb

## 2013-04-27 DIAGNOSIS — Z9989 Dependence on other enabling machines and devices: Secondary | ICD-10-CM

## 2013-04-27 DIAGNOSIS — G4733 Obstructive sleep apnea (adult) (pediatric): Secondary | ICD-10-CM

## 2013-05-03 DIAGNOSIS — G4733 Obstructive sleep apnea (adult) (pediatric): Secondary | ICD-10-CM | POA: Diagnosis not present

## 2013-05-03 NOTE — Sleep Study (Signed)
   NAME: Noah Huffman DATE OF BIRTH:  02/03/1941 MEDICAL RECORD NUMBER [id number]  LOCATION: [facility]  PHYSICIAN: Brandom Kerwin D  DATE OF STUDY: 04/27/2013  SLEEP STUDY TYPE: Nocturnal Polysomnogram               REFERRING PHYSICIAN: Precious Reel, MD  INDICATION FOR STUDY: Hypersomnia with sleep apnea   EPWORTH SLEEPINESS SCORE:   7/24  HEIGHT: 6\' 4"  (193 cm)  WEIGHT: 290 lb (131.543 kg)    Body mass index is 35.31 kg/(m^2).  NECK SIZE: 18 in.  MEDICATIONS:  chart for review   SLEEP ARCHITECTURE:  split study protocol. During the diagnostic phase total sleep time 127.5 minutes with sleep efficiency 73.9%. Stage I was 30.2%, stage II 59.6%, stage III absent, REM 10.2% of total sleep time. Sleep latency 31.5 minutes, REM latency 91 minutes, awake after sleep onset 15 minutes, arousal index  17.4, bedtime medication: None   RESPIRATORY DATA: apnea hypopneas index 16.5 per hour. A total of 35 events scored including 5 obstructive apneas and 30 hypopneas. Most events were associated with supine sleep position. REM AHI 9.2 per hour. CPAP was titrated to 15 CWP with incomplete control before study time ran out. He wore a large F&P Simplus fullface mask with heated humidifier and an EPR of 3.   OXYGEN DATA: loud snoring before CPAP with oxygen desaturation to a nadir of 87% on room air. At final pressures, some snoring was still noted with mean oxygen saturation 93.7% on room air.   CARDIAC DATA: sinus rhythm with PVCs   MOVEMENT/PARASOMNIA:  no significant movement disturbance, bathroom x2   IMPRESSION/ RECOMMENDATION:   1) Moderate obstructive sleep apnea/hypopnea syndrome, AHI 16.5 per hour with mostly supine events.  Loud snoring with oxygen desaturation to a nadir of 87% on room air. 2) Unsuccessful CPAP titration within available time. At final pressure 15 CWP, residual AHI 42.9 per hour. Most events would clear while off flat of back. He was wearing a large  [employer] Simplus mask with heated humidifier and EPR of 3.    Better control would be anticipated at higher pressure, which might require use of bilevel PAP. Consider return for dedicated CPAP titration study, to allow more time for evaluation. Some patients requiring especially high pressures have  anatomic factors which    could be addressed by ENT evaluation when appropriate.   Signed Baird Lyons M.D. Deneise Lever Diplomate, American Board of Sleep Medicine  ELECTRONICALLY SIGNED ON:  05/03/2013, 9:09 AM [place] PH: [phone]   FX: [phone] [place]

## 2013-05-12 ENCOUNTER — Telehealth: Payer: Self-pay | Admitting: Internal Medicine

## 2013-05-12 NOTE — Telephone Encounter (Signed)
Dr. Melvyn Novas please advise if you have results? thanks

## 2013-05-13 NOTE — Telephone Encounter (Signed)
It appears Dr Virgina Jock ordered the study and I had nothing to do with it nor am I qualified to interpret or advise on sleep issues- I can't tell that it's been read yet but Dr Virgina Jock will get the interpretation and recs when available and may refer him to one of our sleep docs when available.

## 2013-05-13 NOTE — Telephone Encounter (Signed)
Pt states that Dr Annamaria Boots had interpreted the sleep study.  Katie please advise on what to tell pt.  Thank you.

## 2013-05-14 NOTE — Telephone Encounter (Signed)
I spoke with the pt and he states he has already received the results from Dr. Virgina Jock and he is advising he have another sleep study done, but the pt is having several issues with this and wants to see a sleep doctor here to review first. Appt set for 06-06-13. Pt is aware. [place] Bing, CMA

## 2013-06-06 ENCOUNTER — Ambulatory Visit (INDEPENDENT_AMBULATORY_CARE_PROVIDER_SITE_OTHER): Payer: Medicare Other | Admitting: Pulmonary Disease

## 2013-06-06 ENCOUNTER — Encounter: Payer: Self-pay | Admitting: Pulmonary Disease

## 2013-06-06 VITALS — BP 134/78 | HR 59 | Temp 97.6°F | Ht 76.0 in | Wt 285.0 lb

## 2013-06-06 DIAGNOSIS — G4733 Obstructive sleep apnea (adult) (pediatric): Secondary | ICD-10-CM | POA: Diagnosis not present

## 2013-06-06 HISTORY — DX: Obstructive sleep apnea (adult) (pediatric): G47.33

## 2013-06-06 NOTE — Assessment & Plan Note (Signed)
The patient has mild to moderate sleep apnea by his recent sleep study, but he does not feel that he is overly symptomatic at this time. He feels rested in the mornings upon arising, and doesn't feel he has significant daytime sleepiness. I have had a long discussion with him about sleep apnea, including its impact to his quality of life and cardiovascular health. Because he has more mild to moderate disease, this really is not a significant health risk for him at this point. Therefore the decision to treat this aggressively should depend upon its impact to his quality of life. I have outlined a conservative path with a trial of weight loss over the next 6 months, as well as more aggressive path with a trial of a dental appliance or CPAP while working on weight loss. After a long discussion, the patient would like to take some time to try and work on weight loss aggressively. He will let me know if he wishes to try the more aggressive approach.

## 2013-06-06 NOTE — Progress Notes (Signed)
Subjective:    Patient ID: Noah Huffman, male    DOB: 1940/10/05, 73 y.o.   MRN: [id number]  HPI The patient is a 73 year old male who I've been asked to see for management of obstructive sleep apnea.  He is had a recent sleep study that showed mild to moderate OSA, with an AHI of 17 events per hour. There was not adequate time to reach and optimal CPAP pressure is part of a titration study. The patient states he has been told that he has loud snoring, and as well as an occasional witnessed apnea by his bed partner. He has frequent awakenings during the night that he blames on his bladder issues, but he feels rested in the mornings upon arising. He has rare episodes of inappropriate daytime sleepiness with inactivity during the day, but it tends to be a little worse in the mid afternoon. He does have sleepiness with watching TV or movies after 8 PM. He denies any sleepiness with driving. The patient states that his weight has been neutral over the last 2 years, and his Epworth score today is only 7.   Sleep Questionnaire What time do you typically go to bed?( Between what hours) 10 10 at 0932 on 06/06/13 by Virl Cagey, CMA How long does it take you to fall asleep? 70mins 65mins at 0932 on 06/06/13 by Virl Cagey, CMA How many times during the night do you wake up? 3 3 at 0932 on 06/06/13 by Virl Cagey, CMA What time do you get out of bed to start your day? No Value 630-7 at 0932 on 06/06/13 by Virl Cagey, CMA Do you drive or operate heavy machinery in your occupation? No No at 0932 on 06/06/13 by Virl Cagey, CMA How much has your weight changed (up or down) over the past two years? (In pounds) 10 lb (4.536 kg) 10 lb (4.536 kg) at 0932 on 06/06/13 by Virl Cagey, CMA Have you ever had a sleep study before? Yes Yes at 0932 on 06/06/13 by Virl Cagey, CMA If yes, [facility]32 on 06/06/13 by Virl Cagey, CMA If yes, date of study?  04/2013 04/2013 at 0932 on 06/06/13 by Virl Cagey, CMA Do you currently use CPAP? No No at 0932 on 06/06/13 by Virl Cagey, CMA Do you wear oxygen at any time? No No at 0932 on 06/06/13 by Virl Cagey, CMA   Review of Systems  Constitutional: Negative for fever and unexpected weight change.  HENT: Negative for congestion, dental problem, ear pain, nosebleeds, postnasal drip, rhinorrhea, sinus pressure, sneezing, sore throat and trouble swallowing.   Eyes: Negative for redness and itching.  Respiratory: Positive for cough, shortness of breath and wheezing. Negative for chest tightness.   Cardiovascular: Negative for palpitations and leg swelling.  Gastrointestinal: Negative for nausea and vomiting.  Genitourinary: Negative for dysuria.  Musculoskeletal: Negative for joint swelling.  Skin: Negative for rash.  Neurological: Negative for headaches.  Hematological: Does not bruise/bleed easily.  Psychiatric/Behavioral: Negative for dysphoric mood. The patient is not nervous/anxious.        Objective:   Physical Exam Constitutional:  Obese male, no acute distress  HENT:  Nares patent without discharge, but deviated septum to right with narrowing.   Oropharynx without exudate, palate and uvula are moderately elongated.   Eyes:  Perrla, eomi, no scleral icterus  Neck:  No JVD, no TMG  Cardiovascular:  Normal rate,  regular rhythm, no rubs or gallops.  No murmurs        Intact distal pulses  Pulmonary :  Normal breath sounds, no stridor or respiratory distress   No rales, rhonchi, or wheezing  Abdominal:  Soft, nondistended, bowel sounds present.  No tenderness noted.   Musculoskeletal: minimal lower extremity edema noted.  Lymph Nodes:  No cervical lymphadenopathy noted  Skin:  No cyanosis noted  Neurologic:  Alert, appropriate, moves all 4 extremities without obvious deficit.         Assessment & Plan:

## 2013-06-06 NOTE — Patient Instructions (Signed)
Work on weight loss over the next 25mos, but call me if you wish to try more aggressive treatment of your sleep apnea such as dental appliance or cpap

## 2013-08-07 DIAGNOSIS — D3130 Benign neoplasm of unspecified choroid: Secondary | ICD-10-CM | POA: Diagnosis not present

## 2013-08-20 DIAGNOSIS — H903 Sensorineural hearing loss, bilateral: Secondary | ICD-10-CM | POA: Diagnosis not present

## 2013-08-20 DIAGNOSIS — H6120 Impacted cerumen, unspecified ear: Secondary | ICD-10-CM | POA: Diagnosis not present

## 2013-08-25 DIAGNOSIS — S60559A Superficial foreign body of unspecified hand, initial encounter: Secondary | ICD-10-CM | POA: Diagnosis not present

## 2013-08-25 DIAGNOSIS — I10 Essential (primary) hypertension: Secondary | ICD-10-CM | POA: Diagnosis not present

## 2013-08-25 DIAGNOSIS — M25549 Pain in joints of unspecified hand: Secondary | ICD-10-CM | POA: Diagnosis not present

## 2013-08-25 DIAGNOSIS — S60929A Unspecified superficial injury of unspecified hand, initial encounter: Secondary | ICD-10-CM | POA: Diagnosis not present

## 2013-10-21 DIAGNOSIS — J449 Chronic obstructive pulmonary disease, unspecified: Secondary | ICD-10-CM | POA: Diagnosis not present

## 2013-10-21 DIAGNOSIS — E785 Hyperlipidemia, unspecified: Secondary | ICD-10-CM | POA: Diagnosis not present

## 2013-10-21 DIAGNOSIS — I10 Essential (primary) hypertension: Secondary | ICD-10-CM | POA: Diagnosis not present

## 2013-12-10 DIAGNOSIS — R059 Cough, unspecified: Secondary | ICD-10-CM | POA: Diagnosis not present

## 2013-12-10 DIAGNOSIS — J111 Influenza due to unidentified influenza virus with other respiratory manifestations: Secondary | ICD-10-CM | POA: Diagnosis not present

## 2013-12-10 DIAGNOSIS — I10 Essential (primary) hypertension: Secondary | ICD-10-CM | POA: Diagnosis not present

## 2013-12-10 DIAGNOSIS — R509 Fever, unspecified: Secondary | ICD-10-CM | POA: Diagnosis not present

## 2013-12-10 DIAGNOSIS — R05 Cough: Secondary | ICD-10-CM | POA: Diagnosis not present

## 2013-12-10 DIAGNOSIS — J309 Allergic rhinitis, unspecified: Secondary | ICD-10-CM | POA: Diagnosis not present

## 2013-12-10 DIAGNOSIS — J449 Chronic obstructive pulmonary disease, unspecified: Secondary | ICD-10-CM | POA: Diagnosis not present

## 2014-04-21 DIAGNOSIS — Z008 Encounter for other general examination: Secondary | ICD-10-CM | POA: Diagnosis not present

## 2014-04-21 DIAGNOSIS — I10 Essential (primary) hypertension: Secondary | ICD-10-CM | POA: Diagnosis not present

## 2014-04-21 DIAGNOSIS — Z125 Encounter for screening for malignant neoplasm of prostate: Secondary | ICD-10-CM | POA: Diagnosis not present

## 2014-04-21 DIAGNOSIS — E785 Hyperlipidemia, unspecified: Secondary | ICD-10-CM | POA: Diagnosis not present

## 2014-04-21 DIAGNOSIS — R739 Hyperglycemia, unspecified: Secondary | ICD-10-CM | POA: Diagnosis not present

## 2014-04-28 DIAGNOSIS — I272 Other secondary pulmonary hypertension: Secondary | ICD-10-CM | POA: Diagnosis not present

## 2014-04-28 DIAGNOSIS — N401 Enlarged prostate with lower urinary tract symptoms: Secondary | ICD-10-CM | POA: Diagnosis not present

## 2014-04-28 DIAGNOSIS — R9431 Abnormal electrocardiogram [ECG] [EKG]: Secondary | ICD-10-CM | POA: Diagnosis not present

## 2014-04-28 DIAGNOSIS — Z1389 Encounter for screening for other disorder: Secondary | ICD-10-CM | POA: Diagnosis not present

## 2014-04-28 DIAGNOSIS — J449 Chronic obstructive pulmonary disease, unspecified: Secondary | ICD-10-CM | POA: Diagnosis not present

## 2014-04-28 DIAGNOSIS — E785 Hyperlipidemia, unspecified: Secondary | ICD-10-CM | POA: Diagnosis not present

## 2014-04-28 DIAGNOSIS — I77810 Thoracic aortic ectasia: Secondary | ICD-10-CM | POA: Diagnosis not present

## 2014-04-28 DIAGNOSIS — Z125 Encounter for screening for malignant neoplasm of prostate: Secondary | ICD-10-CM | POA: Diagnosis not present

## 2014-04-28 DIAGNOSIS — Z6834 Body mass index (BMI) 34.0-34.9, adult: Secondary | ICD-10-CM | POA: Diagnosis not present

## 2014-04-28 DIAGNOSIS — J309 Allergic rhinitis, unspecified: Secondary | ICD-10-CM | POA: Diagnosis not present

## 2014-04-28 DIAGNOSIS — Z0000 Encounter for general adult medical examination without abnormal findings: Secondary | ICD-10-CM | POA: Diagnosis not present

## 2014-04-28 DIAGNOSIS — R739 Hyperglycemia, unspecified: Secondary | ICD-10-CM | POA: Diagnosis not present

## 2014-04-29 DIAGNOSIS — Z1212 Encounter for screening for malignant neoplasm of rectum: Secondary | ICD-10-CM | POA: Diagnosis not present

## 2014-05-19 DIAGNOSIS — H6123 Impacted cerumen, bilateral: Secondary | ICD-10-CM | POA: Diagnosis not present

## 2014-05-19 DIAGNOSIS — H903 Sensorineural hearing loss, bilateral: Secondary | ICD-10-CM | POA: Diagnosis not present

## 2014-05-19 DIAGNOSIS — H60391 Other infective otitis externa, right ear: Secondary | ICD-10-CM | POA: Diagnosis not present

## 2014-06-22 DIAGNOSIS — H903 Sensorineural hearing loss, bilateral: Secondary | ICD-10-CM | POA: Diagnosis not present

## 2014-06-22 DIAGNOSIS — H6123 Impacted cerumen, bilateral: Secondary | ICD-10-CM | POA: Diagnosis not present

## 2014-07-05 DIAGNOSIS — Z23 Encounter for immunization: Secondary | ICD-10-CM | POA: Diagnosis not present

## 2014-10-30 DIAGNOSIS — M159 Polyosteoarthritis, unspecified: Secondary | ICD-10-CM | POA: Diagnosis not present

## 2014-10-30 DIAGNOSIS — Z6833 Body mass index (BMI) 33.0-33.9, adult: Secondary | ICD-10-CM | POA: Diagnosis not present

## 2014-10-30 DIAGNOSIS — E785 Hyperlipidemia, unspecified: Secondary | ICD-10-CM | POA: Diagnosis not present

## 2014-10-30 DIAGNOSIS — I77810 Thoracic aortic ectasia: Secondary | ICD-10-CM | POA: Diagnosis not present

## 2014-10-30 DIAGNOSIS — I10 Essential (primary) hypertension: Secondary | ICD-10-CM | POA: Diagnosis not present

## 2014-10-30 DIAGNOSIS — J449 Chronic obstructive pulmonary disease, unspecified: Secondary | ICD-10-CM | POA: Diagnosis not present

## 2014-10-30 DIAGNOSIS — G4733 Obstructive sleep apnea (adult) (pediatric): Secondary | ICD-10-CM | POA: Diagnosis not present

## 2014-10-30 DIAGNOSIS — R0609 Other forms of dyspnea: Secondary | ICD-10-CM | POA: Diagnosis not present

## 2014-10-30 DIAGNOSIS — I272 Other secondary pulmonary hypertension: Secondary | ICD-10-CM | POA: Diagnosis not present

## 2014-11-10 DIAGNOSIS — H1013 Acute atopic conjunctivitis, bilateral: Secondary | ICD-10-CM | POA: Diagnosis not present

## 2014-11-18 DIAGNOSIS — H6502 Acute serous otitis media, left ear: Secondary | ICD-10-CM | POA: Diagnosis not present

## 2014-11-18 DIAGNOSIS — H903 Sensorineural hearing loss, bilateral: Secondary | ICD-10-CM | POA: Diagnosis not present

## 2014-11-18 DIAGNOSIS — H6123 Impacted cerumen, bilateral: Secondary | ICD-10-CM | POA: Diagnosis not present

## 2014-11-18 DIAGNOSIS — H6063 Unspecified chronic otitis externa, bilateral: Secondary | ICD-10-CM | POA: Diagnosis not present

## 2014-12-02 DIAGNOSIS — H903 Sensorineural hearing loss, bilateral: Secondary | ICD-10-CM | POA: Diagnosis not present

## 2014-12-16 IMAGING — CR DG CHEST 2V
2 series · 2 of 2 positions shown · non-contrast
Comparison: 08/16/2011

CLINICAL DATA: Shortness of breath and chest pain for 2 years,
history smoking, hypertension

CHEST - 2 VIEW

[view not recorded (1 of 2)]
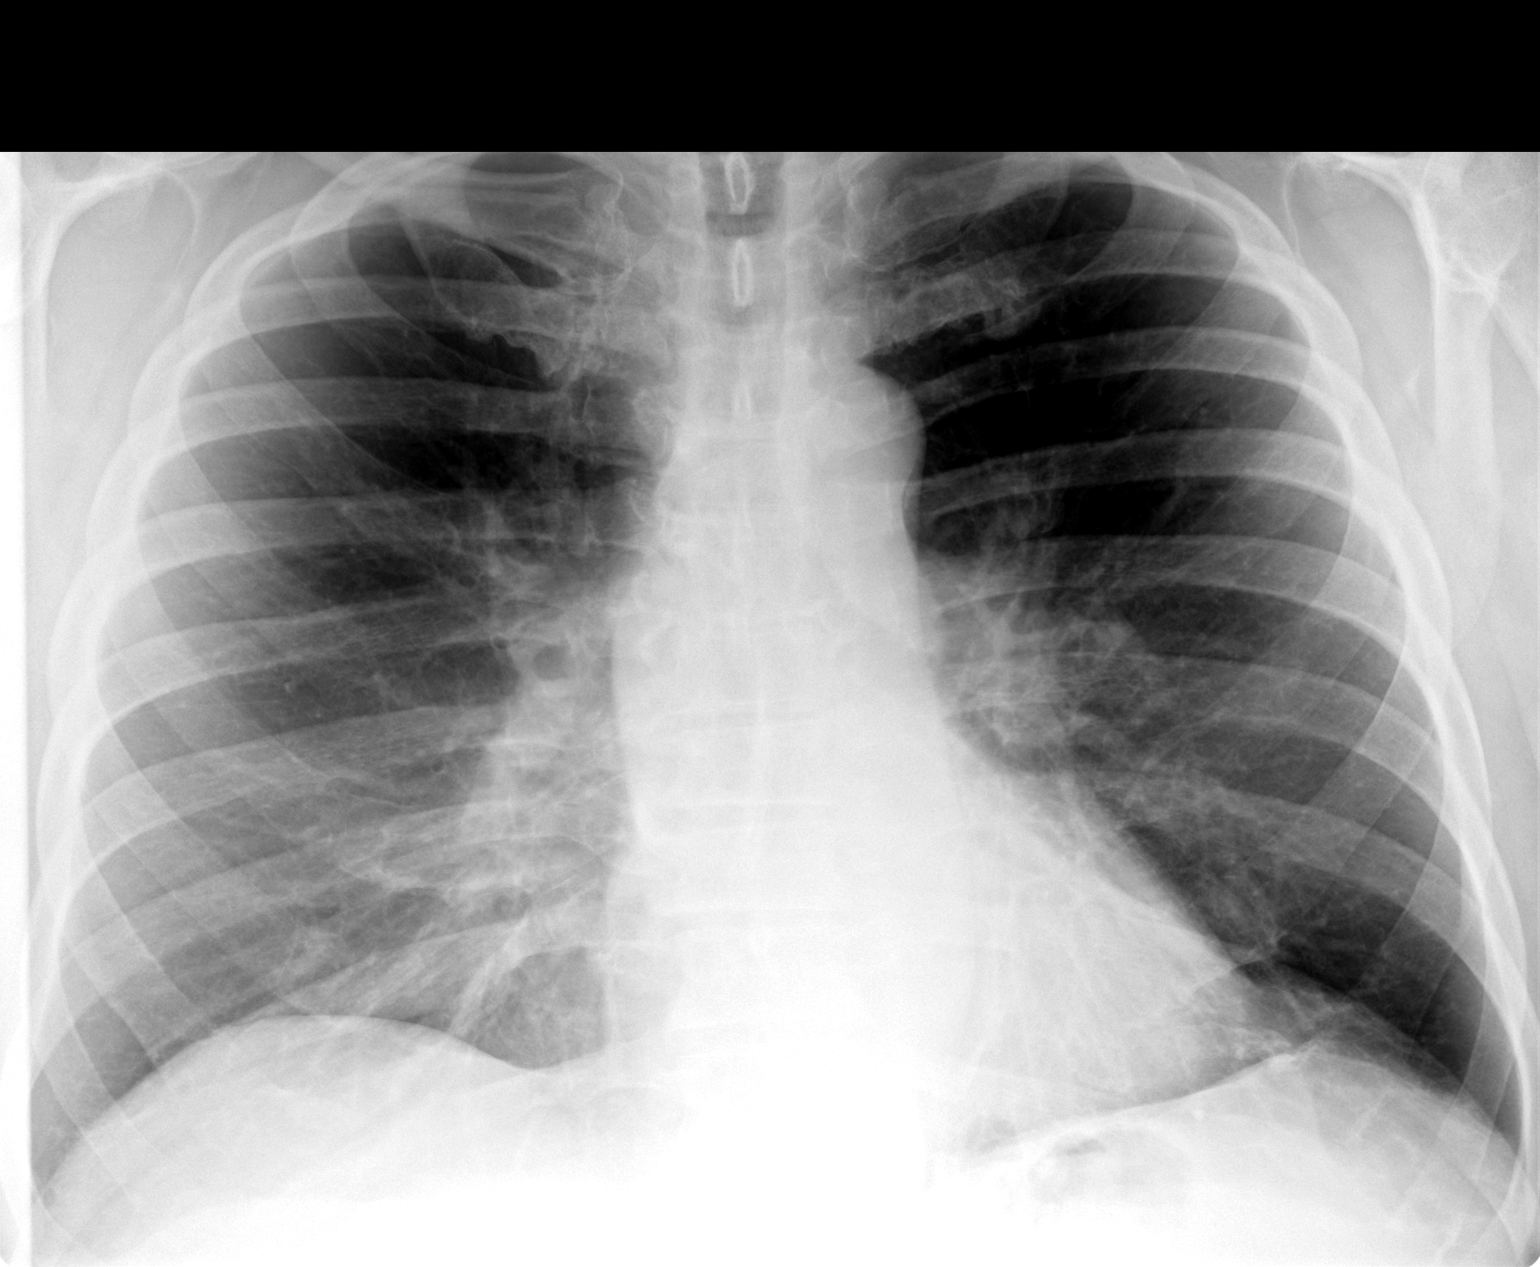

[view not recorded (2 of 2)]
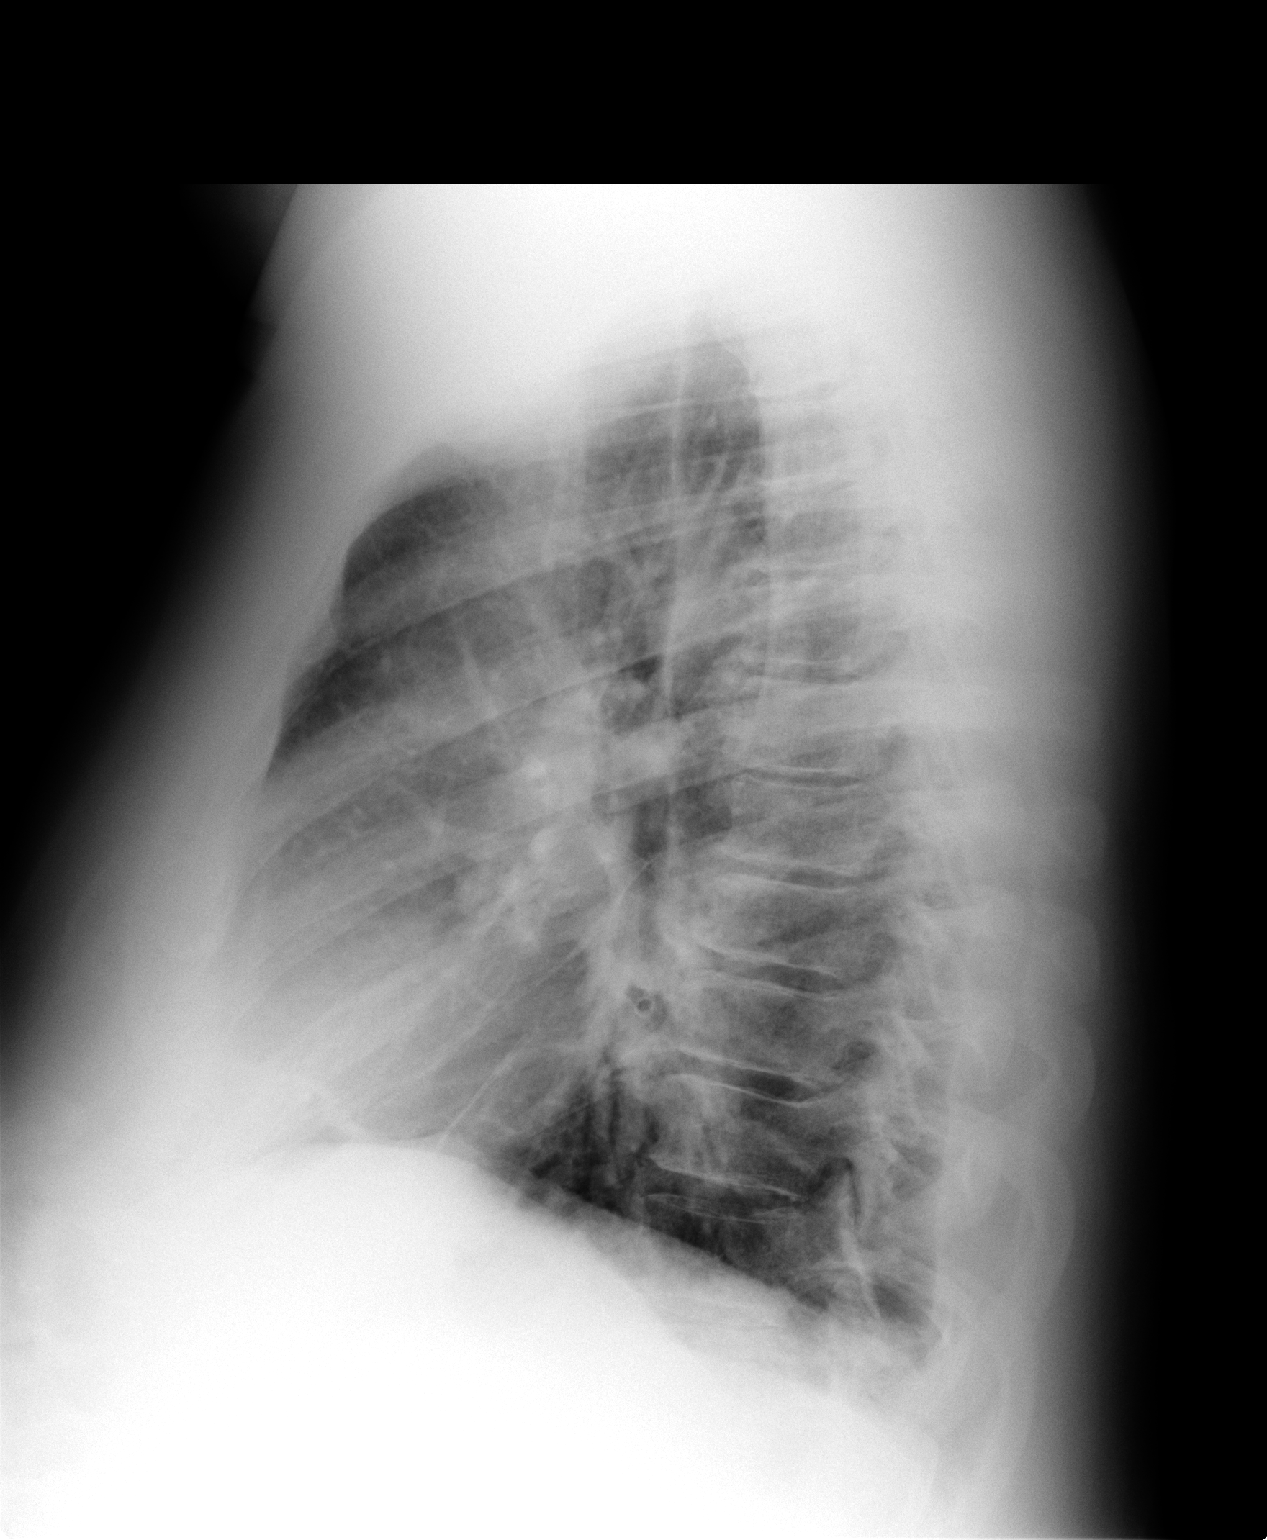

[2 of 2 positions shown; findings below may reference images not displayed]

FINDINGS: Upper normal heart size.
Minimal atherosclerotic calcification tortuosity thoracic aorta.
Slightly prominent central pulmonary arteries stable.
Peribronchial thickening with accentuation of perihilar markings
similar to previous exam.
Streaky atelectasis right base.
On the lateral view, increased infrahilar opacity is seen, suspect
related to right basilar atelectasis identified on the PA view.
No definite acute infiltrate, pleural effusion or pneumothorax.
Bones unremarkable.
IMPRESSION: Bronchitic changes with right basilar atelectasis.

## 2015-03-18 DIAGNOSIS — Z6834 Body mass index (BMI) 34.0-34.9, adult: Secondary | ICD-10-CM | POA: Diagnosis not present

## 2015-03-18 DIAGNOSIS — J0100 Acute maxillary sinusitis, unspecified: Secondary | ICD-10-CM | POA: Diagnosis not present

## 2015-03-18 DIAGNOSIS — J449 Chronic obstructive pulmonary disease, unspecified: Secondary | ICD-10-CM | POA: Diagnosis not present

## 2015-03-18 DIAGNOSIS — R05 Cough: Secondary | ICD-10-CM | POA: Diagnosis not present

## 2015-03-18 DIAGNOSIS — G4733 Obstructive sleep apnea (adult) (pediatric): Secondary | ICD-10-CM | POA: Diagnosis not present

## 2015-04-28 DIAGNOSIS — R739 Hyperglycemia, unspecified: Secondary | ICD-10-CM | POA: Diagnosis not present

## 2015-04-28 DIAGNOSIS — I10 Essential (primary) hypertension: Secondary | ICD-10-CM | POA: Diagnosis not present

## 2015-04-28 DIAGNOSIS — E784 Other hyperlipidemia: Secondary | ICD-10-CM | POA: Diagnosis not present

## 2015-04-28 DIAGNOSIS — Z125 Encounter for screening for malignant neoplasm of prostate: Secondary | ICD-10-CM | POA: Diagnosis not present

## 2015-05-06 DIAGNOSIS — R739 Hyperglycemia, unspecified: Secondary | ICD-10-CM | POA: Diagnosis not present

## 2015-05-06 DIAGNOSIS — Z6835 Body mass index (BMI) 35.0-35.9, adult: Secondary | ICD-10-CM | POA: Diagnosis not present

## 2015-05-06 DIAGNOSIS — I77810 Thoracic aortic ectasia: Secondary | ICD-10-CM | POA: Diagnosis not present

## 2015-05-06 DIAGNOSIS — Z1389 Encounter for screening for other disorder: Secondary | ICD-10-CM | POA: Diagnosis not present

## 2015-05-06 DIAGNOSIS — Z0000 Encounter for general adult medical examination without abnormal findings: Secondary | ICD-10-CM | POA: Diagnosis not present

## 2015-05-06 DIAGNOSIS — G609 Hereditary and idiopathic neuropathy, unspecified: Secondary | ICD-10-CM | POA: Diagnosis not present

## 2015-05-06 DIAGNOSIS — Z23 Encounter for immunization: Secondary | ICD-10-CM | POA: Diagnosis not present

## 2015-05-06 DIAGNOSIS — I272 Other secondary pulmonary hypertension: Secondary | ICD-10-CM | POA: Diagnosis not present

## 2015-05-06 DIAGNOSIS — R9431 Abnormal electrocardiogram [ECG] [EKG]: Secondary | ICD-10-CM | POA: Diagnosis not present

## 2015-05-06 DIAGNOSIS — E784 Other hyperlipidemia: Secondary | ICD-10-CM | POA: Diagnosis not present

## 2015-05-06 DIAGNOSIS — J449 Chronic obstructive pulmonary disease, unspecified: Secondary | ICD-10-CM | POA: Diagnosis not present

## 2015-05-06 DIAGNOSIS — R490 Dysphonia: Secondary | ICD-10-CM | POA: Diagnosis not present

## 2015-05-07 DIAGNOSIS — Z1212 Encounter for screening for malignant neoplasm of rectum: Secondary | ICD-10-CM | POA: Diagnosis not present

## 2015-05-10 ENCOUNTER — Telehealth: Payer: Self-pay | Admitting: Cardiology

## 2015-05-10 NOTE — Telephone Encounter (Signed)
Received records from [facility] for appointment on 05/31/15 with Dr Percival Spanish.  Records given to [facility] (medical records) for Dr Hochrein's schedule on 05/31/15. lp

## 2015-05-18 DIAGNOSIS — G609 Hereditary and idiopathic neuropathy, unspecified: Secondary | ICD-10-CM | POA: Diagnosis not present

## 2015-05-18 DIAGNOSIS — M792 Neuralgia and neuritis, unspecified: Secondary | ICD-10-CM | POA: Diagnosis not present

## 2015-05-18 DIAGNOSIS — M7742 Metatarsalgia, left foot: Secondary | ICD-10-CM | POA: Diagnosis not present

## 2015-05-18 DIAGNOSIS — M79671 Pain in right foot: Secondary | ICD-10-CM | POA: Diagnosis not present

## 2015-05-18 DIAGNOSIS — M7741 Metatarsalgia, right foot: Secondary | ICD-10-CM | POA: Diagnosis not present

## 2015-05-18 DIAGNOSIS — M79672 Pain in left foot: Secondary | ICD-10-CM | POA: Diagnosis not present

## 2015-05-25 DIAGNOSIS — M792 Neuralgia and neuritis, unspecified: Secondary | ICD-10-CM | POA: Diagnosis not present

## 2015-05-25 DIAGNOSIS — M7741 Metatarsalgia, right foot: Secondary | ICD-10-CM | POA: Diagnosis not present

## 2015-05-25 DIAGNOSIS — M7742 Metatarsalgia, left foot: Secondary | ICD-10-CM | POA: Diagnosis not present

## 2015-05-30 NOTE — Progress Notes (Signed)
Cardiology Office Note   Date:  05/31/2015   ID:  DEYON WESSON, DOB Aug 14, 1940, MRN [id number]  PCP:  Precious Reel, MD  Cardiologist:   Minus Breeding, MD   Chief Complaint  Patient presents with  . Shortness of Breath  . Chest Pain      History of Present Illness: KAJUAN TERRY is a 75 y.o. male who presents for evaluation of shortness of breath and chest discomfort. The patient has no past cardiac history. I did review an echocardiogram from 2014. I'm not clear why this was done but he had a normal ejection fraction and no significant abnormalities at that time. He's been having shortness of breath with chest discomfort for about 3 years. This has been an upper airway type congestion with cough. When he gets short of breath he might get a sharp discomfort in his mid upper chest. There is no radiation to his jaw or to his arms. He sometimes get anxious with this. The cough is chronically productive of clear sputum. He has nasal drainage. He's not had any cardiac workup. However, he has seen a pulmonologist to stop his ACE inhibitor. He's had mild to moderate sleep apnea diagnosed. Seen in GI doctor and started on proton pump inhibitors. He has seen ENT.  All of this has been without resolution of his symptoms. 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 With this he cannot bring on any symptoms. He does not describe neck or arm discomfort. He does not describe PND or orthopnea. He does sleep on a small wedge.  Past Medical History  Diagnosis Date  . Hyperglycemia   . GERD (gastroesophageal reflux disease)   . BPH (benign prostatic hypertrophy) with urinary obstruction   . Hypertension   . Obesity   . Osteoarthritis   . Inguinal hernia   . Diverticulosis   . OSA (obstructive sleep apnea) 06/06/2013    Past Surgical History  Procedure Laterality Date  . Hammer toe surgery    . Inguinal hernia repair      right  . Arthroscopic knee surgery    .  Appendectomy    . Tonsillectomy and adenoidectomy    . Total hip arthroplasty  2007    right  . Cataract extraction  2012    bilateral     Current Outpatient Prescriptions  Medication Sig Dispense Refill  . budesonide-formoterol (SYMBICORT) 160-4.5 MCG/ACT inhaler Inhale 2 puffs into the lungs 2 (two) times daily as needed.     Marland Kitchen losartan-hydrochlorothiazide (HYZAAR) 100-25 MG tablet Take 1 tablet by mouth daily.     No current facility-administered medications for this visit.    Allergies:   Morphine and related    Social History:  The patient  reports that he quit smoking about 27 years ago. His smoking use included Cigarettes. He has a 62.5 pack-year smoking history. He has never used smokeless tobacco. He reports that he drinks about 1.2 oz of alcohol per week. He reports that he does not use illicit drugs.   Family History:  The patient's family history includes Brain cancer in his brother; Colon cancer (age of onset: 9) in his mother; Leukemia in his father; Pneumonia in his father; Transient ischemic attack in his father.    ROS:  Please see the history of present illness.   Otherwise, review of systems are positive for none.   All other systems are reviewed and negative.    PHYSICAL EXAM:  VS:  BP 144/72 mmHg  Pulse 58  Ht 6\' 4"  (1.93 m)  Wt 282 lb (127.914 kg)  BMI 34.34 kg/m2 , BMI Body mass index is 34.34 kg/(m^2). GENERAL:  Well appearing HEENT:  Pupils equal round and reactive, fundi not visualized, oral mucosa unremarkable NECK:  No jugular venous distention, waveform within normal limits, carotid upstroke brisk and symmetric, no bruits, no thyromegaly LYMPHATICS:  No cervical, inguinal adenopathy LUNGS:  Clear to auscultation bilaterally BACK:  No CVA tenderness CHEST:  Unremarkable HEART:  PMI not displaced or sustained,S1 and S2 within normal limits, no S3, no S4, no clicks, no rubs, no murmurs ABD:  Flat, positive bowel sounds normal in frequency in pitch,  no bruits, no rebound, no guarding, no midline pulsatile mass, no hepatomegaly, no splenomegaly EXT:  2 plus pulses throughout, no edema, no cyanosis no clubbing SKIN:  No rashes no nodules NEURO:  Cranial nerves II through XII grossly intact, motor grossly intact throughout PSYCH:  Cognitively intact, oriented to person place and time    EKG:  EKG is ordered today. The ekg ordered today demonstrates sinus rhythm, rate 58, left axis deviation, left anterior fascicular block, no acute ST-T wave changes.   Recent Labs: No results found for requested labs within last 365 days.    Lipid Panel No results found for: CHOL, TRIG, HDL, CHOLHDL, VLDL, LDLCALC, LDLDIRECT    Wt Readings from Last 3 Encounters:  05/31/15 282 lb (127.914 kg)  06/06/13 285 lb (129.275 kg)  04/27/13 290 lb (131.543 kg)      Other studies Reviewed: Additional studies/ records that were reviewed today include: Office records. Review of the above records demonstrates:  Please see elsewhere in the note.     ASSESSMENT AND PLAN:  CHEST PAIN/SOB:  I don't suspect a cardiac etiology. However, he does have risk factors. I will bring the patient back for a POET (Plain Old Exercise Test). This will allow me to screen for obstructive coronary disease, risk stratify and very importantly provide a prescription for exercise.  DYSLIPIDEMIA:  His LDL was 134. We talked extensively about diet and increased activity.  HTN:  His blood pressure is acceptable. Of note he didn't have any improvement in his congestion when he was not taking an ARB or an ACE inhibitor. He could be tried off of this again in the future to see if there is any improvement but I doubt that his current ARB is contributing. For now he will continue the meds as listed. His blood pressure was accelerated with exercise might make a change.   Current medicines are reviewed at length with the patient today.  The patient does not have concerns regarding  medicines.  The following changes have been made:  no change  Labs/ tests ordered today include:   Orders Placed This Encounter  Procedures  . Exercise Tolerance Test  . EKG 12-Lead     Disposition:   FU with me as needed.      Signed, Minus Breeding, MD  05/31/2015 10:06 AM    [facility]

## 2015-05-31 ENCOUNTER — Ambulatory Visit (INDEPENDENT_AMBULATORY_CARE_PROVIDER_SITE_OTHER): Payer: Medicare Other | Admitting: Cardiology

## 2015-05-31 ENCOUNTER — Encounter: Payer: Self-pay | Admitting: Cardiology

## 2015-05-31 VITALS — BP 144/72 | HR 58 | Ht 76.0 in | Wt 282.0 lb

## 2015-05-31 DIAGNOSIS — R0789 Other chest pain: Secondary | ICD-10-CM

## 2015-05-31 DIAGNOSIS — R0602 Shortness of breath: Secondary | ICD-10-CM

## 2015-05-31 NOTE — Patient Instructions (Signed)
Your physician recommends that you schedule a follow-up appointment in: As Needed  Your physician has requested that you have an exercise tolerance test. For further information please visit [email or web address]. Please also follow instruction sheet, as given.    

## 2015-06-08 DIAGNOSIS — M792 Neuralgia and neuritis, unspecified: Secondary | ICD-10-CM | POA: Diagnosis not present

## 2015-06-16 DIAGNOSIS — R509 Fever, unspecified: Secondary | ICD-10-CM | POA: Diagnosis not present

## 2015-06-16 DIAGNOSIS — J0100 Acute maxillary sinusitis, unspecified: Secondary | ICD-10-CM | POA: Diagnosis not present

## 2015-06-16 DIAGNOSIS — R05 Cough: Secondary | ICD-10-CM | POA: Diagnosis not present

## 2015-06-16 DIAGNOSIS — J40 Bronchitis, not specified as acute or chronic: Secondary | ICD-10-CM | POA: Diagnosis not present

## 2015-06-16 DIAGNOSIS — Z6834 Body mass index (BMI) 34.0-34.9, adult: Secondary | ICD-10-CM | POA: Diagnosis not present

## 2015-06-23 ENCOUNTER — Inpatient Hospital Stay (HOSPITAL_COMMUNITY): Admission: RE | Admit: 2015-06-23 | Payer: Medicare Other | Source: Ambulatory Visit

## 2015-07-06 DIAGNOSIS — B078 Other viral warts: Secondary | ICD-10-CM | POA: Diagnosis not present

## 2015-07-06 DIAGNOSIS — L2089 Other atopic dermatitis: Secondary | ICD-10-CM | POA: Diagnosis not present

## 2015-07-06 DIAGNOSIS — D692 Other nonthrombocytopenic purpura: Secondary | ICD-10-CM | POA: Diagnosis not present

## 2015-07-06 DIAGNOSIS — D485 Neoplasm of uncertain behavior of skin: Secondary | ICD-10-CM | POA: Diagnosis not present

## 2015-07-20 ENCOUNTER — Telehealth (HOSPITAL_COMMUNITY): Payer: Self-pay

## 2015-07-20 DIAGNOSIS — M792 Neuralgia and neuritis, unspecified: Secondary | ICD-10-CM | POA: Diagnosis not present

## 2015-07-20 NOTE — Telephone Encounter (Signed)
Encounter complete. 

## 2015-07-22 ENCOUNTER — Ambulatory Visit (HOSPITAL_COMMUNITY)
Admission: RE | Admit: 2015-07-22 | Discharge: 2015-07-22 | Disposition: A | Discharge status: Home / Self Care | Payer: Medicare Other | Source: Ambulatory Visit | Attending: Cardiovascular Disease | Admitting: Cardiovascular Disease

## 2015-07-22 DIAGNOSIS — R0602 Shortness of breath: Secondary | ICD-10-CM | POA: Insufficient documentation

## 2015-07-22 LAB — EXERCISE TOLERANCE TEST
CHL CUP MPHR: 147 {beats}/min
CSEPEW: 8 METS
CSEPPHR: 134 {beats}/min
Exercise duration (min): 6 min
Exercise duration (sec): 42 s
Percent HR: 91 %
RPE: 17
Rest HR: 77 {beats}/min

## 2015-08-10 DIAGNOSIS — L720 Epidermal cyst: Secondary | ICD-10-CM | POA: Diagnosis not present

## 2015-08-10 DIAGNOSIS — L821 Other seborrheic keratosis: Secondary | ICD-10-CM | POA: Diagnosis not present

## 2015-08-10 DIAGNOSIS — L2089 Other atopic dermatitis: Secondary | ICD-10-CM | POA: Diagnosis not present

## 2015-09-16 DIAGNOSIS — S0181XA Laceration without foreign body of other part of head, initial encounter: Secondary | ICD-10-CM | POA: Diagnosis not present

## 2015-10-26 DIAGNOSIS — M792 Neuralgia and neuritis, unspecified: Secondary | ICD-10-CM | POA: Diagnosis not present

## 2015-11-08 DIAGNOSIS — H1013 Acute atopic conjunctivitis, bilateral: Secondary | ICD-10-CM | POA: Diagnosis not present

## 2015-11-09 DIAGNOSIS — K219 Gastro-esophageal reflux disease without esophagitis: Secondary | ICD-10-CM | POA: Diagnosis not present

## 2015-11-09 DIAGNOSIS — G608 Other hereditary and idiopathic neuropathies: Secondary | ICD-10-CM | POA: Diagnosis not present

## 2015-11-09 DIAGNOSIS — I272 Other secondary pulmonary hypertension: Secondary | ICD-10-CM | POA: Diagnosis not present

## 2015-11-09 DIAGNOSIS — R7309 Other abnormal glucose: Secondary | ICD-10-CM | POA: Diagnosis not present

## 2015-11-09 DIAGNOSIS — J449 Chronic obstructive pulmonary disease, unspecified: Secondary | ICD-10-CM | POA: Diagnosis not present

## 2015-11-09 DIAGNOSIS — G4733 Obstructive sleep apnea (adult) (pediatric): Secondary | ICD-10-CM | POA: Diagnosis not present

## 2015-11-09 DIAGNOSIS — J984 Other disorders of lung: Secondary | ICD-10-CM | POA: Diagnosis not present

## 2015-11-09 DIAGNOSIS — I10 Essential (primary) hypertension: Secondary | ICD-10-CM | POA: Diagnosis not present

## 2015-11-09 DIAGNOSIS — I77810 Thoracic aortic ectasia: Secondary | ICD-10-CM | POA: Diagnosis not present

## 2015-11-09 DIAGNOSIS — R0609 Other forms of dyspnea: Secondary | ICD-10-CM | POA: Diagnosis not present

## 2015-11-09 DIAGNOSIS — Z6834 Body mass index (BMI) 34.0-34.9, adult: Secondary | ICD-10-CM | POA: Diagnosis not present

## 2015-11-10 ENCOUNTER — Other Ambulatory Visit: Payer: Self-pay | Admitting: Internal Medicine

## 2015-11-10 DIAGNOSIS — J449 Chronic obstructive pulmonary disease, unspecified: Secondary | ICD-10-CM

## 2015-11-10 DIAGNOSIS — R0609 Other forms of dyspnea: Secondary | ICD-10-CM

## 2015-11-10 DIAGNOSIS — R911 Solitary pulmonary nodule: Secondary | ICD-10-CM

## 2015-12-13 ENCOUNTER — Ambulatory Visit
Admission: RE | Admit: 2015-12-13 | Discharge: 2015-12-13 | Disposition: A | Discharge status: Home / Self Care | Payer: Medicare Other | Source: Ambulatory Visit | Attending: Internal Medicine | Admitting: Internal Medicine

## 2015-12-13 DIAGNOSIS — R911 Solitary pulmonary nodule: Secondary | ICD-10-CM

## 2015-12-13 DIAGNOSIS — R918 Other nonspecific abnormal finding of lung field: Secondary | ICD-10-CM | POA: Diagnosis not present

## 2015-12-13 DIAGNOSIS — J449 Chronic obstructive pulmonary disease, unspecified: Secondary | ICD-10-CM

## 2015-12-13 DIAGNOSIS — R0609 Other forms of dyspnea: Secondary | ICD-10-CM

## 2016-01-24 DIAGNOSIS — J3089 Other allergic rhinitis: Secondary | ICD-10-CM | POA: Diagnosis not present

## 2016-01-24 DIAGNOSIS — R0600 Dyspnea, unspecified: Secondary | ICD-10-CM | POA: Diagnosis not present

## 2016-01-24 DIAGNOSIS — J3081 Allergic rhinitis due to animal (cat) (dog) hair and dander: Secondary | ICD-10-CM | POA: Diagnosis not present

## 2016-01-24 DIAGNOSIS — R05 Cough: Secondary | ICD-10-CM | POA: Diagnosis not present

## 2016-01-24 DIAGNOSIS — R0602 Shortness of breath: Secondary | ICD-10-CM | POA: Diagnosis not present

## 2016-01-24 DIAGNOSIS — J301 Allergic rhinitis due to pollen: Secondary | ICD-10-CM | POA: Diagnosis not present

## 2016-04-02 DIAGNOSIS — Z23 Encounter for immunization: Secondary | ICD-10-CM | POA: Diagnosis not present

## 2016-04-25 DIAGNOSIS — M792 Neuralgia and neuritis, unspecified: Secondary | ICD-10-CM | POA: Diagnosis not present

## 2016-05-02 DIAGNOSIS — Z0000 Encounter for general adult medical examination without abnormal findings: Secondary | ICD-10-CM | POA: Diagnosis not present

## 2016-05-02 DIAGNOSIS — E784 Other hyperlipidemia: Secondary | ICD-10-CM | POA: Diagnosis not present

## 2016-05-02 DIAGNOSIS — I10 Essential (primary) hypertension: Secondary | ICD-10-CM | POA: Diagnosis not present

## 2016-05-02 DIAGNOSIS — Z125 Encounter for screening for malignant neoplasm of prostate: Secondary | ICD-10-CM | POA: Diagnosis not present

## 2016-05-02 DIAGNOSIS — R7309 Other abnormal glucose: Secondary | ICD-10-CM | POA: Diagnosis not present

## 2016-05-09 DIAGNOSIS — F4321 Adjustment disorder with depressed mood: Secondary | ICD-10-CM | POA: Diagnosis not present

## 2016-05-09 DIAGNOSIS — Z0000 Encounter for general adult medical examination without abnormal findings: Secondary | ICD-10-CM | POA: Diagnosis not present

## 2016-05-09 DIAGNOSIS — Z6836 Body mass index (BMI) 36.0-36.9, adult: Secondary | ICD-10-CM | POA: Diagnosis not present

## 2016-05-09 DIAGNOSIS — I2789 Other specified pulmonary heart diseases: Secondary | ICD-10-CM | POA: Diagnosis not present

## 2016-05-09 DIAGNOSIS — I77810 Thoracic aortic ectasia: Secondary | ICD-10-CM | POA: Diagnosis not present

## 2016-05-09 DIAGNOSIS — J449 Chronic obstructive pulmonary disease, unspecified: Secondary | ICD-10-CM | POA: Diagnosis not present

## 2016-05-09 DIAGNOSIS — Z1389 Encounter for screening for other disorder: Secondary | ICD-10-CM | POA: Diagnosis not present

## 2016-05-09 DIAGNOSIS — R05 Cough: Secondary | ICD-10-CM | POA: Diagnosis not present

## 2016-05-09 DIAGNOSIS — Z23 Encounter for immunization: Secondary | ICD-10-CM | POA: Diagnosis not present

## 2016-05-10 DIAGNOSIS — Z1212 Encounter for screening for malignant neoplasm of rectum: Secondary | ICD-10-CM | POA: Diagnosis not present

## 2016-07-13 DIAGNOSIS — G603 Idiopathic progressive neuropathy: Secondary | ICD-10-CM | POA: Diagnosis not present

## 2016-07-13 DIAGNOSIS — R202 Paresthesia of skin: Secondary | ICD-10-CM | POA: Diagnosis not present

## 2016-07-13 DIAGNOSIS — G5603 Carpal tunnel syndrome, bilateral upper limbs: Secondary | ICD-10-CM | POA: Diagnosis not present

## 2016-07-13 DIAGNOSIS — H8110 Benign paroxysmal vertigo, unspecified ear: Secondary | ICD-10-CM | POA: Diagnosis not present

## 2016-07-13 DIAGNOSIS — G609 Hereditary and idiopathic neuropathy, unspecified: Secondary | ICD-10-CM | POA: Diagnosis not present

## 2016-07-13 DIAGNOSIS — R201 Hypoesthesia of skin: Secondary | ICD-10-CM | POA: Diagnosis not present

## 2016-07-13 DIAGNOSIS — M79671 Pain in right foot: Secondary | ICD-10-CM | POA: Diagnosis not present

## 2016-07-13 DIAGNOSIS — M5417 Radiculopathy, lumbosacral region: Secondary | ICD-10-CM | POA: Diagnosis not present

## 2016-07-13 DIAGNOSIS — R634 Abnormal weight loss: Secondary | ICD-10-CM | POA: Diagnosis not present

## 2016-07-25 DIAGNOSIS — J3089 Other allergic rhinitis: Secondary | ICD-10-CM | POA: Diagnosis not present

## 2016-07-25 DIAGNOSIS — J3081 Allergic rhinitis due to animal (cat) (dog) hair and dander: Secondary | ICD-10-CM | POA: Diagnosis not present

## 2016-07-25 DIAGNOSIS — J301 Allergic rhinitis due to pollen: Secondary | ICD-10-CM | POA: Diagnosis not present

## 2016-07-25 DIAGNOSIS — R05 Cough: Secondary | ICD-10-CM | POA: Diagnosis not present

## 2016-08-16 DIAGNOSIS — G603 Idiopathic progressive neuropathy: Secondary | ICD-10-CM | POA: Diagnosis not present

## 2016-08-16 DIAGNOSIS — G5603 Carpal tunnel syndrome, bilateral upper limbs: Secondary | ICD-10-CM | POA: Diagnosis not present

## 2016-08-16 DIAGNOSIS — M5417 Radiculopathy, lumbosacral region: Secondary | ICD-10-CM | POA: Diagnosis not present

## 2016-08-16 DIAGNOSIS — R202 Paresthesia of skin: Secondary | ICD-10-CM | POA: Diagnosis not present

## 2016-11-09 ENCOUNTER — Other Ambulatory Visit: Payer: Self-pay | Admitting: Internal Medicine

## 2016-11-09 DIAGNOSIS — R05 Cough: Secondary | ICD-10-CM | POA: Diagnosis not present

## 2016-11-09 DIAGNOSIS — R7309 Other abnormal glucose: Secondary | ICD-10-CM | POA: Diagnosis not present

## 2016-11-09 DIAGNOSIS — I77810 Thoracic aortic ectasia: Secondary | ICD-10-CM | POA: Diagnosis not present

## 2016-11-09 DIAGNOSIS — I712 Thoracic aortic aneurysm, without rupture, unspecified: Secondary | ICD-10-CM

## 2016-11-09 DIAGNOSIS — Z6835 Body mass index (BMI) 35.0-35.9, adult: Secondary | ICD-10-CM | POA: Diagnosis not present

## 2016-11-09 DIAGNOSIS — J449 Chronic obstructive pulmonary disease, unspecified: Secondary | ICD-10-CM | POA: Diagnosis not present

## 2016-11-09 DIAGNOSIS — I2789 Other specified pulmonary heart diseases: Secondary | ICD-10-CM | POA: Diagnosis not present

## 2016-11-09 DIAGNOSIS — R251 Tremor, unspecified: Secondary | ICD-10-CM | POA: Diagnosis not present

## 2016-11-09 DIAGNOSIS — H9319 Tinnitus, unspecified ear: Secondary | ICD-10-CM | POA: Diagnosis not present

## 2016-11-09 DIAGNOSIS — F4321 Adjustment disorder with depressed mood: Secondary | ICD-10-CM | POA: Diagnosis not present

## 2016-11-09 DIAGNOSIS — I10 Essential (primary) hypertension: Secondary | ICD-10-CM | POA: Diagnosis not present

## 2016-11-09 DIAGNOSIS — G608 Other hereditary and idiopathic neuropathies: Secondary | ICD-10-CM | POA: Diagnosis not present

## 2016-11-14 ENCOUNTER — Ambulatory Visit
Admission: RE | Admit: 2016-11-14 | Discharge: 2016-11-14 | Disposition: A | Discharge status: Home / Self Care | Payer: Medicare Other | Source: Ambulatory Visit | Attending: Internal Medicine | Admitting: Internal Medicine

## 2016-11-14 ENCOUNTER — Inpatient Hospital Stay
Admission: RE | Admit: 2016-11-14 | Discharge: 2016-11-14 | Disposition: A | Discharge status: Home / Self Care | Payer: Medicare Other | Source: Ambulatory Visit | Attending: Internal Medicine | Admitting: Internal Medicine

## 2016-11-14 ENCOUNTER — Other Ambulatory Visit: Payer: Medicare Other

## 2016-11-14 DIAGNOSIS — I712 Thoracic aortic aneurysm, without rupture, unspecified: Secondary | ICD-10-CM

## 2016-11-14 DIAGNOSIS — N429 Disorder of prostate, unspecified: Secondary | ICD-10-CM | POA: Diagnosis not present

## 2016-11-14 MED ORDER — IOPAMIDOL (ISOVUE-370) INJECTION 76%
75.0000 mL | Freq: Once | INTRAVENOUS | Status: AC | PRN
  Administered 2016-11-14: 75 mL via INTRAVENOUS

## 2016-11-16 DIAGNOSIS — H1013 Acute atopic conjunctivitis, bilateral: Secondary | ICD-10-CM | POA: Diagnosis not present

## 2017-01-03 DIAGNOSIS — J449 Chronic obstructive pulmonary disease, unspecified: Secondary | ICD-10-CM | POA: Diagnosis not present

## 2017-01-03 DIAGNOSIS — Z6835 Body mass index (BMI) 35.0-35.9, adult: Secondary | ICD-10-CM | POA: Diagnosis not present

## 2017-01-03 DIAGNOSIS — R05 Cough: Secondary | ICD-10-CM | POA: Diagnosis not present

## 2017-01-03 DIAGNOSIS — J0100 Acute maxillary sinusitis, unspecified: Secondary | ICD-10-CM | POA: Diagnosis not present

## 2017-01-03 DIAGNOSIS — I10 Essential (primary) hypertension: Secondary | ICD-10-CM | POA: Diagnosis not present

## 2017-02-20 DIAGNOSIS — E785 Hyperlipidemia, unspecified: Secondary | ICD-10-CM | POA: Diagnosis not present

## 2017-02-20 DIAGNOSIS — E7849 Other hyperlipidemia: Secondary | ICD-10-CM | POA: Diagnosis not present

## 2017-05-04 DIAGNOSIS — Z125 Encounter for screening for malignant neoplasm of prostate: Secondary | ICD-10-CM | POA: Diagnosis not present

## 2017-05-04 DIAGNOSIS — R82998 Other abnormal findings in urine: Secondary | ICD-10-CM | POA: Diagnosis not present

## 2017-05-04 DIAGNOSIS — E7849 Other hyperlipidemia: Secondary | ICD-10-CM | POA: Diagnosis not present

## 2017-05-04 DIAGNOSIS — E785 Hyperlipidemia, unspecified: Secondary | ICD-10-CM | POA: Diagnosis not present

## 2017-05-04 DIAGNOSIS — R7309 Other abnormal glucose: Secondary | ICD-10-CM | POA: Diagnosis not present

## 2017-05-04 DIAGNOSIS — I10 Essential (primary) hypertension: Secondary | ICD-10-CM | POA: Diagnosis not present

## 2017-05-11 DIAGNOSIS — R05 Cough: Secondary | ICD-10-CM | POA: Diagnosis not present

## 2017-05-11 DIAGNOSIS — J449 Chronic obstructive pulmonary disease, unspecified: Secondary | ICD-10-CM | POA: Diagnosis not present

## 2017-05-11 DIAGNOSIS — Z6836 Body mass index (BMI) 36.0-36.9, adult: Secondary | ICD-10-CM | POA: Diagnosis not present

## 2017-05-11 DIAGNOSIS — M25511 Pain in right shoulder: Secondary | ICD-10-CM | POA: Diagnosis not present

## 2017-05-11 DIAGNOSIS — I77810 Thoracic aortic ectasia: Secondary | ICD-10-CM | POA: Diagnosis not present

## 2017-05-11 DIAGNOSIS — I712 Thoracic aortic aneurysm, without rupture: Secondary | ICD-10-CM | POA: Diagnosis not present

## 2017-05-11 DIAGNOSIS — I2721 Secondary pulmonary arterial hypertension: Secondary | ICD-10-CM | POA: Diagnosis not present

## 2017-05-11 DIAGNOSIS — G4733 Obstructive sleep apnea (adult) (pediatric): Secondary | ICD-10-CM | POA: Diagnosis not present

## 2017-05-11 DIAGNOSIS — Z0000 Encounter for general adult medical examination without abnormal findings: Secondary | ICD-10-CM | POA: Diagnosis not present

## 2017-05-11 DIAGNOSIS — J984 Other disorders of lung: Secondary | ICD-10-CM | POA: Diagnosis not present

## 2017-05-11 DIAGNOSIS — Z1389 Encounter for screening for other disorder: Secondary | ICD-10-CM | POA: Diagnosis not present

## 2017-05-17 DIAGNOSIS — Z1212 Encounter for screening for malignant neoplasm of rectum: Secondary | ICD-10-CM | POA: Diagnosis not present

## 2017-07-24 DIAGNOSIS — J3081 Allergic rhinitis due to animal (cat) (dog) hair and dander: Secondary | ICD-10-CM | POA: Diagnosis not present

## 2017-07-24 DIAGNOSIS — J3089 Other allergic rhinitis: Secondary | ICD-10-CM | POA: Diagnosis not present

## 2017-07-24 DIAGNOSIS — R05 Cough: Secondary | ICD-10-CM | POA: Diagnosis not present

## 2017-07-24 DIAGNOSIS — J301 Allergic rhinitis due to pollen: Secondary | ICD-10-CM | POA: Diagnosis not present

## 2017-07-25 ENCOUNTER — Encounter: Payer: Self-pay | Admitting: Internal Medicine

## 2017-08-03 ENCOUNTER — Encounter: Payer: Self-pay | Admitting: Internal Medicine

## 2017-08-16 DIAGNOSIS — H903 Sensorineural hearing loss, bilateral: Secondary | ICD-10-CM | POA: Diagnosis not present

## 2017-08-16 DIAGNOSIS — H6121 Impacted cerumen, right ear: Secondary | ICD-10-CM | POA: Diagnosis not present

## 2017-08-20 ENCOUNTER — Institutional Professional Consult (permissible substitution): Payer: Medicare Other | Admitting: Internal Medicine

## 2017-08-24 ENCOUNTER — Ambulatory Visit (INDEPENDENT_AMBULATORY_CARE_PROVIDER_SITE_OTHER): Payer: Medicare Other | Admitting: Internal Medicine

## 2017-08-24 ENCOUNTER — Encounter: Payer: Self-pay | Admitting: Internal Medicine

## 2017-08-24 VITALS — BP 140/78 | HR 61 | Ht 76.0 in | Wt 282.0 lb

## 2017-08-24 DIAGNOSIS — Z836 Family history of other diseases of the respiratory system: Secondary | ICD-10-CM

## 2017-08-24 DIAGNOSIS — R0602 Shortness of breath: Secondary | ICD-10-CM | POA: Diagnosis not present

## 2017-08-24 DIAGNOSIS — R942 Abnormal results of pulmonary function studies: Secondary | ICD-10-CM

## 2017-08-24 DIAGNOSIS — R05 Cough: Secondary | ICD-10-CM | POA: Diagnosis not present

## 2017-08-24 DIAGNOSIS — Z87891 Personal history of nicotine dependence: Secondary | ICD-10-CM | POA: Diagnosis not present

## 2017-08-24 DIAGNOSIS — R053 Chronic cough: Secondary | ICD-10-CM

## 2017-08-24 LAB — NITRIC OXIDE: Nitric Oxide: 22

## 2017-08-24 MED ORDER — TIOTROPIUM BROMIDE MONOHYDRATE 2.5 MCG/ACT IN AERS: 2.0000 | INHALATION_SPRAY | Freq: Every day | RESPIRATORY_TRACT | 5 refills | Status: DC

## 2017-08-24 MED ORDER — TIOTROPIUM BROMIDE MONOHYDRATE 2.5 MCG/ACT IN AERS: 2.0000 | INHALATION_SPRAY | Freq: Every day | RESPIRATORY_TRACT | 0 refills | Status: DC

## 2017-08-24 NOTE — Patient Instructions (Addendum)
ICD-10-CM   1. Shortness of breath R06.02 CT Chest High Resolution    Pulmonary function test    Nitric oxide  2. Chronic cough R05 Pulmonary function test    Nitric oxide  3. Family history of pulmonary fibrosis Z83.6 Pulmonary function test    Nitric oxide  4. History of smoking greater than 50 pack years Z87.891 Pulmonary function test    Nitric oxide  5. Abnormal PFT R94.2      Remains not fully explained Also out of proportion to your walk test  Plan Do HRCT supine and prone Do full PFT  followup Next few to several weeks with Dr [name] but after completing above

## 2017-08-24 NOTE — Addendum Note (Signed)
Addended by: Lorretta Harp on: 08/24/2017 04:07 PM   Modules accepted: Orders

## 2017-08-24 NOTE — Progress Notes (Signed)
Subjective:    Patient ID: [name], male    DOB: 1940/04/24, 77 y.o.   MRN: [id number]  PCP [name], MD Referring physician Dr. Fredderick Phenix  HPI  IOV 08/24/2017  Chief Complaint  Patient presents with  . Consult    Referred by Dr. Lamarcus Hedge due to trouble coughing, coughing up mucus that is dark gray in color, and breathing has become worse. Pt also has c/o hoarseness that began 6 months ago. Pt's main complaint is labored breathing. Pt states he had a brother who died of pulmonary fibrosis.    Noah Huffman , 77 y.o. , with dob 11/06/1940 and male ,Not Hispanic or Latino from [address] [place] [address] - presents to pulm clinic for dyspnea and cough.  He is originally from [place] area and moved to [place] in the late 1960s and in [place] since the 1970s.His brother died at age 41 from pulmonary fibrosis lasted at the [facility] he is also worried about that.  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  He smoked heavily over 62 pack smoking history but quit over 25 years ago.  He tells me for the last 4 to 5 years he has had shortness of breath that is slowly progressive.  Definitely present with exertion relieved by rest.  Walking today in our office 185 feet x 3 laps on room air he did have some mild shortness of breath but did not desaturate.  There is no associated chest pain.  He does have a chronic cough that for which she was started on Symbicort few to several years ago and it did seem to help but in the last year or 2 the cough is deteriorated and associated with some yellow sputum production.  He does have some on and off hoarseness of voice that is constant for the last few years.  He recollects having had a ENT evaluation a few years ago that was normal.  He clears his throat.  There is excess feeling of postnasal drip there is some occasional choking episodes he finds the cough annoying and there is  an occasional sensation of something sticking in his throat but there is no heartburn or dysphagia or coughing after lying down.  His RSI cough score is 19.  He is noticed to be on losartan  In 2017 he did have a plain old exercise treadmill test that was normal.   FenO 08/24/2017 = 22 ppb on spiriva and symbicort.  Recently started on Symbicort in the last few months by Dr. Fredderick Phenix allergist.  Patient tells me that allergy testing was negative/normal    In 2014 he had spirometry that shows both reduced FVC and FEV1 with a reduced ratio slightly favoring obstruction  He had a CT chest aug 2018 that shows some bibasilar atelectasis this was a CT chest angiography.  In 2017 he had a CT abdomen pelvis that does not show these applications.  I personally visualized the CT chest and confirmed the findings.    Simple office walk 185 feet x  3 laps goal with forehead probe 08/24/2017   O2 used Room air  Number laps completed 3  Comments about pace Normal pace,, tall man,   Resting Pulse Ox/HR 98% and 61/min  Final Pulse Ox/HR 96% and 88/min  Desaturated </= 88% no  Desaturated <= 3% points no  Got Tachycardic >/= 90/min no  Symptoms at end of test  Mild dyspneic  Miscellaneous comments x     has a past medical history of BPH (benign prostatic hypertrophy) with urinary obstruction, Diverticulosis, GERD (gastroesophageal reflux disease), Hyperglycemia, Hypertension, Inguinal hernia, Obesity, OSA (obstructive sleep apnea) (06/06/2013), and Osteoarthritis.   reports that he quit smoking about 29 years ago. His smoking use included cigarettes. He has a 62.50 pack-year smoking history. He has never used smokeless tobacco.  Past Surgical History:  Procedure Laterality Date  . APPENDECTOMY    . arthroscopic knee surgery    . CATARACT EXTRACTION  2012   bilateral  . HAMMER TOE SURGERY    . INGUINAL HERNIA REPAIR     right  . TONSILLECTOMY AND ADENOIDECTOMY    . TOTAL HIP ARTHROPLASTY  2007    right    Allergies  Allergen Reactions  . Morphine And Related Rash    Immunization History  Administered Date(s) Administered  . Influenza,inj,Quad PF,6+ Mos 01/14/2017    Family History  Problem Relation Age of Onset  . Leukemia Father   . Transient ischemic attack Father   . Pneumonia Father   . Colon cancer Mother 13  . Brain cancer Brother      Current Outpatient Medications:  .  budesonide-formoterol (SYMBICORT) 160-4.5 MCG/ACT inhaler, Inhale 2 puffs into the lungs 2 (two) times daily as needed. , Disp: , Rfl:  .  cetirizine (ZYRTEC) 10 MG tablet, TK 1 T PO QD PRN, Disp: , Rfl: 4 .  gabapentin (NEURONTIN) 600 MG tablet, TK 2 TS PO TID, Disp: , Rfl: 6 .  losartan-hydrochlorothiazide (HYZAAR) 100-25 MG tablet, Take 1 tablet by mouth daily., Disp: , Rfl:  .  rosuvastatin (CRESTOR) 10 MG tablet, , Disp: , Rfl:  .  Tiotropium Bromide Monohydrate (SPIRIVA RESPIMAT) 2.5 MCG/ACT AERS, Inhale 2 puffs into the lungs daily., Disp: , Rfl:    Review of Systems  Constitutional: Negative for fever and unexpected weight change.  HENT: Positive for congestion, ear pain, postnasal drip and voice change. Negative for dental problem, rhinorrhea, sinus pressure, sneezing, sore throat and trouble swallowing.   Eyes: Negative for redness and itching.  Respiratory: Positive for cough, shortness of breath and wheezing. Negative for chest tightness.   Cardiovascular: Positive for leg swelling. Negative for palpitations.  Gastrointestinal: Negative for nausea and vomiting.  Genitourinary: Negative for dysuria.  Musculoskeletal: Negative for joint swelling.  Skin: Negative for rash.  Allergic/Immunologic: Positive for environmental allergies. Negative for food allergies and immunocompromised state.  Neurological: Positive for headaches.  Hematological: Bruises/bleeds easily.  Psychiatric/Behavioral: Positive for dysphoric mood. The patient is nervous/anxious.        Objective:   Physical  Exam  Constitutional: He is oriented to person, place, and time. He appears well-developed and well-nourished. No distress.  Tall Body mass index is 34.33 kg/m.   HENT:  Head: Normocephalic and atraumatic.  Right Ear: External ear normal.  Left Ear: External ear normal.  Mouth/Throat: Oropharynx is clear and moist. No oropharyngeal exudate.  Eyes: Pupils are equal, round, and reactive to light. Conjunctivae and EOM are normal. Right eye exhibits no discharge. Left eye exhibits no discharge. No scleral icterus.  Neck: Normal range of motion. Neck supple. No JVD present. No tracheal deviation present. No thyromegaly present.  Cardiovascular: Normal rate, regular rhythm and intact distal pulses. Exam reveals no gallop and no friction rub.  No murmur heard. Pulmonary/Chest: Effort normal and breath sounds normal. No respiratory distress. He has no wheezes. He has no rales. He exhibits no tenderness.  Mild non velcro crackles bilaterally at base  Abdominal: Soft. Bowel sounds are normal. He exhibits no distension and no mass. There is no tenderness. There is no rebound and no guarding.  Visceral obesity +  Musculoskeletal: Normal range of motion. He exhibits no edema or tenderness.  Lymphadenopathy:    He has no cervical adenopathy.  Neurological: He is alert and oriented to person, place, and time. He has normal reflexes. No cranial nerve deficit. Coordination normal.  Skin: Skin is warm and dry. No rash noted. He is not diaphoretic. No erythema. No pallor.  Psychiatric: He has a normal mood and affect. His behavior is normal. Judgment and thought content normal.  Nursing note and vitals reviewed.     Vitals:   08/24/17 1513  BP: 140/78  Pulse: 61  SpO2: 94%  Weight: 282 lb (127.9 kg)  Height: 6\' 4"  (1.93 m)    Estimated body mass index is 34.33 kg/m as calculated from the following:   Height as of this encounter: 6\' 4"  (1.93 m).   Weight as of this encounter: 282 lb (127.9  kg).       Assessment & Plan:     ICD-10-CM   1. Shortness of breath R06.02 CT Chest High Resolution    Pulmonary function test    Nitric oxide  2. Chronic cough R05 Pulmonary function test    Nitric oxide  3. Family history of pulmonary fibrosis Z83.6 Pulmonary function test    Nitric oxide  4. History of smoking greater than 50 pack years Z87.891 Pulmonary function test    Nitric oxide  5. Abnormal PFT R94.2     Differential diagnosis is COPD but given the basal crackles and slight onset of atelectasis in the 2018 CT chest compared to 2017 and the family history of pulmonary fibrosis and given his age even though simple walk test turned out to be okay I think it was getting a high-resolution CT chest and ensuring no new onset of ILD.  This is especially important given the fact that he is had worsening dyspnea.  We will also get pulmonary function test to reassess and compare compared to the spirometry from a few years ago.  If these are noncontributory then the options are to send him to a course of pulmonary rehabilitation for obesity, diastolic dysfunction and physical deconditioning and COPD versus getting a pulmonary stress test.   Dr. Brand Males, M.D., [facility].C.P Pulmonary and Critical Care Medicine Staff Physician, [place] Director - Interstitial Lung Disease  Program  Pulmonary [place] at [place], [place], [address]  Pager: [phone], If no answer or between  15:00h - 7:00h: call [phone] Telephone: [phone]

## 2017-08-24 NOTE — Addendum Note (Signed)
Addended by: Lorretta Harp on: 08/24/2017 04:21 PM   Modules accepted: Orders

## 2017-09-06 ENCOUNTER — Ambulatory Visit (INDEPENDENT_AMBULATORY_CARE_PROVIDER_SITE_OTHER)
Admission: RE | Admit: 2017-09-06 | Discharge: 2017-09-06 | Disposition: A | Discharge status: Home / Self Care | Payer: Medicare Other | Source: Ambulatory Visit | Attending: Internal Medicine | Admitting: Internal Medicine

## 2017-09-06 DIAGNOSIS — R0602 Shortness of breath: Secondary | ICD-10-CM

## 2017-09-12 NOTE — Progress Notes (Signed)
Spoke with pt and notified of results per Dr. [name]. Pt verbalized understanding and denied any questions.  

## 2017-09-13 ENCOUNTER — Encounter: Payer: Self-pay | Admitting: *Deleted

## 2017-10-02 ENCOUNTER — Ambulatory Visit (AMBULATORY_SURGERY_CENTER): Payer: Self-pay | Admitting: *Deleted

## 2017-10-02 ENCOUNTER — Other Ambulatory Visit: Payer: Self-pay

## 2017-10-02 VITALS — Ht 76.0 in | Wt 275.8 lb

## 2017-10-02 DIAGNOSIS — Z800 Family history of malignant neoplasm of digestive organs: Secondary | ICD-10-CM

## 2017-10-02 DIAGNOSIS — Z86010 Personal history of colonic polyps: Secondary | ICD-10-CM

## 2017-10-02 MED ORDER — PEG-KCL-NACL-NASULF-NA ASC-C 140 G PO SOLR
1.0000 | ORAL | 0 refills | Status: DC
Start: 2017-10-02 — End: 2017-10-16

## 2017-10-02 NOTE — Progress Notes (Signed)
No egg or soy allergy known to patient  No issues with past sedation with any surgeries  or procedures, no intubation problems  No diet pills per patient No home 02 use per patient  No blood thinners per patient  Pt denies issues with constipation  No A fib or A flutter  EMMI video sent to pt's e mail - pt declined  [employer]  Lot [id number]  Exp 09/2018 as directed - pt has medicare that will not cover any prep

## 2017-10-03 ENCOUNTER — Telehealth: Payer: Self-pay | Admitting: Internal Medicine

## 2017-10-03 MED ORDER — UMECLIDINIUM BROMIDE 62.5 MCG/INH IN AEPB: 1.0000 | INHALATION_SPRAY | Freq: Every day | RESPIRATORY_TRACT | 3 refills | Status: DC

## 2017-10-03 NOTE — Telephone Encounter (Signed)
Received a fax from pt's pharmacy requesting drug change from spiriva 2.5 to a different inhaler.  Per pt's insurance, the drug is not covered by patient plan.   Preferred alternative is Incruse.  MR, please advise if it is okay to change pt's inhaler.

## 2017-10-03 NOTE — Telephone Encounter (Signed)
Ok to change to incruse

## 2017-10-03 NOTE — Telephone Encounter (Signed)
Called and spoke with pt of MR recommendations Pt verbalized understanding and had no further questions. Placed order today for incruse to pharmacy [facility] Nothing further needed.

## 2017-10-08 ENCOUNTER — Telehealth: Payer: Self-pay | Admitting: Internal Medicine

## 2017-10-08 ENCOUNTER — Ambulatory Visit (INDEPENDENT_AMBULATORY_CARE_PROVIDER_SITE_OTHER): Payer: Medicare Other | Admitting: Internal Medicine

## 2017-10-08 ENCOUNTER — Encounter: Payer: Self-pay | Admitting: Internal Medicine

## 2017-10-08 VITALS — BP 116/66 | HR 56 | Ht 75.0 in | Wt 273.0 lb

## 2017-10-08 DIAGNOSIS — J479 Bronchiectasis, uncomplicated: Secondary | ICD-10-CM | POA: Diagnosis not present

## 2017-10-08 DIAGNOSIS — R0602 Shortness of breath: Secondary | ICD-10-CM | POA: Diagnosis not present

## 2017-10-08 DIAGNOSIS — R053 Chronic cough: Secondary | ICD-10-CM

## 2017-10-08 DIAGNOSIS — Z836 Family history of other diseases of the respiratory system: Secondary | ICD-10-CM

## 2017-10-08 DIAGNOSIS — Z87891 Personal history of nicotine dependence: Secondary | ICD-10-CM

## 2017-10-08 DIAGNOSIS — R05 Cough: Secondary | ICD-10-CM

## 2017-10-08 LAB — PULMONARY FUNCTION TEST
DL/VA % PRED: 83 %
DL/VA: 4.05 ml/min/mmHg/L
DLCO UNC: 28.61 ml/min/mmHg
DLCO unc % pred: 73 %
FEF 25-75 POST: 1.17 L/s
FEF 25-75 Pre: 1.77 L/sec
FEF2575-%Change-Post: -33 %
FEF2575-%PRED-POST: 44 %
FEF2575-%PRED-PRE: 66 %
FEV1-%Change-Post: -7 %
FEV1-%Pred-Post: 71 %
FEV1-%Pred-Pre: 77 %
FEV1-Post: 2.63 L
FEV1-Pre: 2.85 L
FEV1FVC-%Change-Post: 1 %
FEV1FVC-%PRED-PRE: 98 %
FEV6-%Change-Post: -7 %
FEV6-%Pred-Post: 76 %
FEV6-%Pred-Pre: 83 %
FEV6-Post: 3.66 L
FEV6-Pre: 3.97 L
FEV6FVC-%CHANGE-POST: 1 %
FEV6FVC-%Pred-Post: 105 %
FEV6FVC-%Pred-Pre: 104 %
FVC-%Change-Post: -8 %
FVC-%PRED-PRE: 79 %
FVC-%Pred-Post: 72 %
FVC-POST: 3.68 L
FVC-Pre: 4.03 L
POST FEV1/FVC RATIO: 72 %
PRE FEV1/FVC RATIO: 71 %
Post FEV6/FVC ratio: 100 %
Pre FEV6/FVC Ratio: 98 %
RV % pred: 91 %
RV: 2.61 L
TLC % pred: 84 %
TLC: 6.82 L

## 2017-10-08 NOTE — Patient Instructions (Addendum)
ICD-10-CM   1. Bronchiectasis without complication ([place]) X83.2     The diagnosis is mild lower lobe bilateral bronchiectasis You're very minimal burden because of this with lung function being over 70% with the help of Spiriva and Symbicort Glad you are significantly better compared to last visit  Plan - continue Spiriva and Symbicort scheduled - use albuterol as needed - We discussed pulmonary rehabilitation but we will hold off on this - Any issues with bronchitis or respiratory issues please call us = flu shot in the fall - consider some etiologic workup  followup - 9 months or sooner if needed

## 2017-10-08 NOTE — Progress Notes (Signed)
PFT done today. 

## 2017-10-08 NOTE — Telephone Encounter (Signed)
Called and spoke to [place] with covermymeds.  Corrie was calling for update on PA for Sprivia.  Per 10/03/17 phone note- MR switched spiriva to incruse.  I have made Corrie aware of this information.  Nothing further is needed.

## 2017-10-08 NOTE — Progress Notes (Signed)
Subjective:     Patient ID: Noah Huffman, male   DOB: 06-03-1940, 77 y.o.   MRN: [id number]  HPI  IOV 08/24/2017  Chief Complaint  Patient presents with  . Consult    Referred by Dr. Julion Hedge due to trouble coughing, coughing up mucus that is dark gray in color, and breathing has become worse. Pt also has c/o hoarseness that began 6 months ago. Pt's main complaint is labored breathing. Pt states he had a brother who died of pulmonary fibrosis.    Noah Huffman , 77 y.o. , with dob December 30, 1940 and male ,Not Hispanic or Latino from [address] [place] [address] - presents to pulm clinic for dyspnea and cough.  He is originally from [place] area and moved to [place] in the late 1960s and in [place] since the 1970s.His brother died at age 77 from pulmonary fibrosis lasted at the [facility] he is also worried about that.  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  He smoked heavily over 62 pack smoking history but quit over 25 years ago.  He tells me for the last 4 to 5 years he has had shortness of breath that is slowly progre  ssive.  Definitely present with exertion relieved by rest.  Walking today in our office 185 feet x 3 laps on room air he did have some mild shortness of breath but did not desaturate.  There is no associated chest pain.    He does have a chronic cough that for which she was started on Symbicort few to several years ago and it did seem to help but in the last year or 2 the cough is deteriorated and associated with some yellow sputum production.  He does have some on and off hoarseness of voice that is constant for the last few years.  He recollects having had a ENT evaluation a few years ago that was normal.  He clears his throat.  There is excess feeling of postnasal drip there is some occasional choking episodes he finds the cough annoying and there is an occasional sensation of something sticking in  his throat but there is no heartburn or dysphagia or coughing after lying down.  His RSI cough score is 19.  He is noticed to be on losartan  In 2017 he did have a plain old exercise treadmill test that was normal.   FenO 08/24/2017 = 22 ppb on spiriva and symbicort.  Recently started on Symbicort in the last few months by Dr. Fredderick Phenix allergist.  Patient tells me that allergy testing was negative/normal    In 2014 he had spirometry that shows both reduced FVC and FEV1 with a reduced ratio slightly favoring obstruction  He had a CT chest aug 2018 that shows some bibasilar atelectasis this was a CT chest angiography.  In 2017 he had a CT abdomen pelvis that does not show these applications.  I personally visualized the CT chest and confirmed the findings.    OV 10/08/2017  Chief Complaint  Patient presents with  . Follow-up    PFT done today, clears throat,    Follow-up cough and shortness of breath. He continues to be is on Spiriva and Symbicort. In the interim his symptoms of improvement. He only function test today and his FEV1 is 71% associated with moderate obstruction. DLCO is mildly reduced. Overall he feels great. We discussed pulmonary rehabilitation but he says he will exercise  on his own and will think about attending rehabilitation center if x-ray does not help. He had high resolution CT chest that I personally visualized and it shows bilateral lower lobe bronchiectasis Currently no sputum production.   Results for LANG, ZINGG (MRN [id number]) as of 10/08/2017 12:38  Ref. Range 10/08/2017 10:58  FEV1-Post Latest Units: L 2.63  FEV1-%Pred-Post Latest Units: % 71  FEV1-%Change-Post Latest Units: % -7  Post FEV1/FVC ratio Latest Units: % 72  Results for BRIDGER, PIZZI (MRN [id number]) as of 10/08/2017 12:38  Ref. Range 10/08/2017 10:58  DLCO unc Latest Units: ml/min/mmHg 28.61  DLCO unc % pred Latest Units: % 73    Simple office walk 185 feet x  3 laps goal with forehead  probe 08/24/2017   O2 used Room air  Number laps completed 3  Comments about pace Normal pace,, tall man,   Resting Pulse Ox/HR 98% and 61/min  Final Pulse Ox/HR 96% and 88/min  Desaturated </= 88% no  Desaturated <= 3% points no  Got Tachycardic >/= 90/min no  Symptoms at end of test Mild dyspneic  Miscellaneous comments x      has a past medical history of Allergy, Asthma, BPH (benign prostatic hypertrophy) with urinary obstruction, Cataract, Diverticulosis, GERD (gastroesophageal reflux disease), Hyperglycemia, Hyperlipidemia, Hypertension, Inguinal hernia, Neuromuscular disorder ([place]), Obesity, OSA (obstructive sleep apnea) (06/06/2013), Osteoarthritis, and Sleep apnea.   reports that he quit smoking about 29 years ago. His smoking use included cigarettes. He has a 62.50 pack-year smoking history. He has never used smokeless tobacco.  Past Surgical History:  Procedure Laterality Date  . APPENDECTOMY    . arthroscopic knee surgery    . CATARACT EXTRACTION  2012   bilateral  . COLONOSCOPY    . HAMMER TOE SURGERY    . INGUINAL HERNIA REPAIR     right  . POLYPECTOMY    . TONSILLECTOMY AND ADENOIDECTOMY    . TOTAL HIP ARTHROPLASTY  2007   right    Allergies  Allergen Reactions  . Morphine And Related Rash    Immunization History  Administered Date(s) Administered  . Influenza,inj,Quad PF,6+ Mos 01/14/2017    Family History  Problem Relation Age of Onset  . Leukemia Father   . Transient ischemic attack Father   . Pneumonia Father   . Colon cancer Mother 75  . Brain cancer Brother   . Pulmonary fibrosis Brother   . Colon polyps Neg Hx   . Esophageal cancer Neg Hx   . Rectal cancer Neg Hx   . Stomach cancer Neg Hx      Current Outpatient Medications:  .  budesonide-formoterol (SYMBICORT) 160-4.5 MCG/ACT inhaler, Inhale 2 puffs into the lungs 2 (two) times daily as needed. , Disp: , Rfl:  .  cetirizine (ZYRTEC) 10 MG tablet, TK 1 T PO QD PRN, Disp: , Rfl: 4 .   gabapentin (NEURONTIN) 600 MG tablet, TK 2 TS PO TID, Disp: , Rfl: 6 .  losartan-hydrochlorothiazide (HYZAAR) 100-25 MG tablet, Take 1 tablet by mouth daily., Disp: , Rfl:  .  PEG-KCl-NaCl-NaSulf-Na Asc-C (PLENVU) 140 g SOLR, Take 1 kit by mouth as directed., Disp: 1 each, Rfl: 0 .  rosuvastatin (CRESTOR) 10 MG tablet, , Disp: , Rfl:  .  Tiotropium Bromide Monohydrate (SPIRIVA RESPIMAT) 2.5 MCG/ACT AERS, Inhale 2 puffs into the lungs daily., Disp: 1 Inhaler, Rfl: 5 .  Tiotropium Bromide Monohydrate (SPIRIVA RESPIMAT) 2.5 MCG/ACT AERS, Inhale 2 puffs into the lungs daily., Disp: 1 Inhaler, Rfl:  0 .  umeclidinium bromide (INCRUSE ELLIPTA) 62.5 MCG/INH AEPB, Inhale 1 puff into the lungs daily., Disp: 1 each, Rfl: 3   Review of Systems     Objective:   Physical Exam  Constitutional: He is oriented to person, place, and time. He appears well-developed and well-nourished. No distress.  HENT:  Head: Normocephalic and atraumatic.  Right Ear: External ear normal.  Left Ear: External ear normal.  Mouth/Throat: Oropharynx is clear and moist. No oropharyngeal exudate.  Eyes: Pupils are equal, round, and reactive to light. Conjunctivae and EOM are normal. Right eye exhibits no discharge. Left eye exhibits no discharge. No scleral icterus.  Neck: Normal range of motion. Neck supple. No JVD present. No tracheal deviation present. No thyromegaly present.  Cardiovascular: Normal rate, regular rhythm and intact distal pulses. Exam reveals no gallop and no friction rub.  No murmur heard. Pulmonary/Chest: Effort normal and breath sounds normal. No respiratory distress. He has no wheezes. He has no rales. He exhibits no tenderness.  Abdominal: Soft. Bowel sounds are normal. He exhibits no distension and no mass. There is no tenderness. There is no rebound and no guarding.  Musculoskeletal: Normal range of motion. He exhibits no edema or tenderness.  Lymphadenopathy:    He has no cervical adenopathy.   Neurological: He is alert and oriented to person, place, and time. He has normal reflexes. No cranial nerve deficit. Coordination normal.  Skin: Skin is warm and dry. No rash noted. He is not diaphoretic. No erythema. No pallor.  Psychiatric: He has a normal mood and affect. His behavior is normal. Judgment and thought content normal.  Nursing note and vitals reviewed.  Today's Vitals   10/08/17 1216  BP: 116/66  Pulse: (!) 56  SpO2: 92%  Weight: 273 lb (123.8 kg)  Height: _0  (1.905 m)    Estimated body mass index is 34.12 kg/m as calculated from the following:   Height as of this encounter: _1  (1.905 m).   Weight as of this encounter: 273 lb (123.8 kg).     Assessment:       ICD-10-CM   1. Bronchiectasis without complication ([place]) H68.3        Plan:      The diagnosis is mild lower lobe bilateral bronchiectasis You're very minimal burden because of this with lung function being over 70% with the help of Spiriva and Symbicort Glad you are significantly better compared to last visit  Plan - continue Spiriva and Symbicort scheduled - use albuterol as needed - We discussed pulmonary rehabilitation but we will hold off on this - Any issues with bronchitis or respiratory issues please call us = flu shot in the fall - consider some etiologic workup  followup - 9 months or sooner if needed    Dr. Brand Males, M.D., [facility].C.P Pulmonary and Critical Care Medicine Staff Physician, [place] Director - Interstitial Lung Disease  Program  Pulmonary [place] at [place], [place], [address]  Pager: [phone], If no answer or between  15:00h - 7:00h: call [phone] Telephone: [phone]

## 2017-10-09 ENCOUNTER — Other Ambulatory Visit: Payer: Self-pay | Admitting: *Deleted

## 2017-10-16 ENCOUNTER — Ambulatory Visit (AMBULATORY_SURGERY_CENTER): Payer: Medicare Other | Admitting: Internal Medicine

## 2017-10-16 ENCOUNTER — Encounter: Payer: Self-pay | Admitting: Internal Medicine

## 2017-10-16 VITALS — BP 116/69 | HR 44 | Temp 97.1°F | Resp 16 | Ht 76.0 in | Wt 275.0 lb

## 2017-10-16 DIAGNOSIS — G4733 Obstructive sleep apnea (adult) (pediatric): Secondary | ICD-10-CM | POA: Diagnosis not present

## 2017-10-16 DIAGNOSIS — K635 Polyp of colon: Secondary | ICD-10-CM | POA: Diagnosis not present

## 2017-10-16 DIAGNOSIS — Z86010 Personal history of colonic polyps: Secondary | ICD-10-CM | POA: Diagnosis not present

## 2017-10-16 DIAGNOSIS — J45909 Unspecified asthma, uncomplicated: Secondary | ICD-10-CM | POA: Diagnosis not present

## 2017-10-16 DIAGNOSIS — D124 Benign neoplasm of descending colon: Secondary | ICD-10-CM

## 2017-10-16 DIAGNOSIS — J449 Chronic obstructive pulmonary disease, unspecified: Secondary | ICD-10-CM | POA: Diagnosis not present

## 2017-10-16 DIAGNOSIS — D123 Benign neoplasm of transverse colon: Secondary | ICD-10-CM

## 2017-10-16 DIAGNOSIS — D122 Benign neoplasm of ascending colon: Secondary | ICD-10-CM

## 2017-10-16 DIAGNOSIS — I10 Essential (primary) hypertension: Secondary | ICD-10-CM | POA: Diagnosis not present

## 2017-10-16 MED ORDER — SODIUM CHLORIDE 0.9 % IV SOLN: 500.0000 mL | Freq: Once | INTRAVENOUS | Status: AC

## 2017-10-16 NOTE — Patient Instructions (Signed)
Please read handouts on polyps and hemorrhoids. Continue present medications.      YOU HAD AN ENDOSCOPIC PROCEDURE TODAY AT [facility]:   Refer to the procedure report that was given to you for any specific questions about what was found during the examination.  If the procedure report does not answer your questions, please call your gastroenterologist to clarify.  If you requested that your care partner not be given the details of your procedure findings, then the procedure report has been included in a sealed envelope for you to review at your convenience later.  YOU SHOULD EXPECT: Some feelings of bloating in the abdomen. Passage of more gas than usual.  Walking can help get rid of the air that was put into your GI tract during the procedure and reduce the bloating. If you had a lower endoscopy (such as a colonoscopy or flexible sigmoidoscopy) you may notice spotting of blood in your stool or on the toilet paper. If you underwent a bowel prep for your procedure, you may not have a normal bowel movement for a few days.  Please Note:  You might notice some irritation and congestion in your nose or some drainage.  This is from the oxygen used during your procedure.  There is no need for concern and it should clear up in a day or so.  SYMPTOMS TO REPORT IMMEDIATELY:   Following lower endoscopy (colonoscopy or flexible sigmoidoscopy):  Excessive amounts of blood in the stool  Significant tenderness or worsening of abdominal pains  Swelling of the abdomen that is new, acute  Fever of 100F or higher   For urgent or emergent issues, a gastroenterologist can be reached at any hour by calling [phone].   DIET:  We do recommend a small meal at first, but then you may proceed to your regular diet.  Drink plenty of fluids but you should avoid alcoholic beverages for 24 hours.  ACTIVITY:  You should plan to take it easy for the rest of today and you should NOT DRIVE or use  heavy machinery until tomorrow (because of the sedation medicines used during the test).    FOLLOW UP: Our staff will call the number listed on your records the next business day following your procedure to check on you and address any questions or concerns that you may have regarding the information given to you following your procedure. If we do not reach you, we will leave a message.  However, if you are feeling well and you are not experiencing any problems, there is no need to return our call.  We will assume that you have returned to your regular daily activities without incident.  If any biopsies were taken you will be contacted by phone or by letter within the next 1-3 weeks.  Please call us at [phone] if you have not heard about the biopsies in 3 weeks.    SIGNATURES/CONFIDENTIALITY: You and/or your care partner have signed paperwork which will be entered into your electronic medical record.  These signatures attest to the fact that that the information above on your After Visit Summary has been reviewed and is understood.  Full responsibility of the confidentiality of this discharge information lies with you and/or your care-partner.

## 2017-10-16 NOTE — Op Note (Signed)
[facility] Patient Name: Armstrong Creasy Procedure Date: 10/16/2017 11:09 AM MRN: [id number] Endoscopist: Docia Chuck. Henrene Pastor , MD Age: 77 Referring MD:  Date of Birth: Jan 17, 1941 Gender: Male Account #: [id number] Procedure:                Colonoscopy, with cold snare polypectomy x 5 Indications:              High risk colon cancer surveillance: Personal                            history of non-advanced adenoma. Remote examination                            elsewhere. Last examination here April 2014 with 2                            small tubular adenomas Medicines:                Monitored Anesthesia Care Procedure:                Pre-Anesthesia Assessment:                           - Prior to the procedure, a History and Physical                            was performed, and patient medications and                            allergies were reviewed. The patient's tolerance of                            previous anesthesia was also reviewed. The risks                            and benefits of the procedure and the sedation                            options and risks were discussed with the patient.                            All questions were answered, and informed consent                            was obtained. Prior Anticoagulants: The patient has                            taken no previous anticoagulant or antiplatelet                            agents. ASA Grade Assessment: II - A patient with                            mild systemic disease. After reviewing the risks  and benefits, the patient was deemed in                            satisfactory condition to undergo the procedure.                           After obtaining informed consent, the colonoscope                            was passed under direct vision. Throughout the                            procedure, the patient's blood pressure, pulse, and                            oxygen  saturations were monitored continuously. The                            Colonoscope was introduced through the anus and                            advanced to the the cecum, identified by                            appendiceal orifice and ileocecal valve. The                            ileocecal valve, appendiceal orifice, and rectum                            were photographed. The quality of the bowel                            preparation was excellent. The colonoscopy was                            performed without difficulty. The patient tolerated                            the procedure well. The bowel preparation used was                            SUPREP. Scope In: 11:26:36 AM Scope Out: 11:43:27 AM Scope Withdrawal Time: 0 hours 13 minutes 50 seconds  Total Procedure Duration: 0 hours 16 minutes 51 seconds  Findings:                 Five polyps were found in the descending colon,                            transverse colon and ascending colon. The polyps                            were 2 to 4 mm in size. These polyps were removed  with a cold snare. Resection and retrieval were                            complete.                           Internal hemorrhoids were found during retroflexion.                           The exam was otherwise without abnormality on                            direct and retroflexion views. Complications:            No immediate complications. Estimated blood loss:                            None. Estimated Blood Loss:     Estimated blood loss: none. Impression:               - Five 2 to 4 mm polyps in the descending colon, in                            the transverse colon and in the ascending colon,                            removed with a cold snare. Resected and retrieved.                           - Internal hemorrhoids.                           - The examination was otherwise normal on direct                             and retroflexion views. Recommendation:           - Repeat colonoscopy in 3 - 5 years for                            surveillance.                           - Patient has a contact number available for                            emergencies. The signs and symptoms of potential                            delayed complications were discussed with the                            patient. Return to normal activities tomorrow.                            Written discharge instructions were provided to the  patient.                           - Resume previous diet.                           - Continue present medications.                           - Await pathology results. Docia Chuck. Henrene Pastor, MD 10/16/2017 11:48:45 AM This report has been signed electronically.

## 2017-10-16 NOTE — Progress Notes (Signed)
Report to PACU, RN, vss, BBS= Clear.  

## 2017-10-16 NOTE — Progress Notes (Signed)
Pt's states no medical or surgical changes since previsit or office visit. 

## 2017-10-16 NOTE — Progress Notes (Signed)
Called to room to assist during endoscopic procedure.  Patient ID and intended procedure confirmed with present staff. Received instructions for my participation in the procedure from the performing physician.  

## 2017-10-17 ENCOUNTER — Telehealth: Payer: Self-pay | Admitting: *Deleted

## 2017-10-17 NOTE — Telephone Encounter (Signed)
No answer, left message to call if questions or concerns. 

## 2017-10-17 NOTE — Telephone Encounter (Signed)
Second follow up call attempt.  Voicemail with name identifier.  Message left to call if questions or concerns.

## 2017-10-19 ENCOUNTER — Encounter: Payer: Self-pay | Admitting: Internal Medicine

## 2017-11-15 DIAGNOSIS — L918 Other hypertrophic disorders of the skin: Secondary | ICD-10-CM | POA: Diagnosis not present

## 2017-11-15 DIAGNOSIS — J449 Chronic obstructive pulmonary disease, unspecified: Secondary | ICD-10-CM | POA: Diagnosis not present

## 2017-11-15 DIAGNOSIS — R0609 Other forms of dyspnea: Secondary | ICD-10-CM | POA: Diagnosis not present

## 2017-11-15 DIAGNOSIS — L089 Local infection of the skin and subcutaneous tissue, unspecified: Secondary | ICD-10-CM | POA: Diagnosis not present

## 2017-11-15 DIAGNOSIS — K219 Gastro-esophageal reflux disease without esophagitis: Secondary | ICD-10-CM | POA: Diagnosis not present

## 2017-11-15 DIAGNOSIS — R739 Hyperglycemia, unspecified: Secondary | ICD-10-CM | POA: Diagnosis not present

## 2017-11-15 DIAGNOSIS — I10 Essential (primary) hypertension: Secondary | ICD-10-CM | POA: Diagnosis not present

## 2017-11-15 DIAGNOSIS — E7849 Other hyperlipidemia: Secondary | ICD-10-CM | POA: Diagnosis not present

## 2017-11-16 ENCOUNTER — Other Ambulatory Visit: Payer: Self-pay | Admitting: Internal Medicine

## 2017-11-16 DIAGNOSIS — I712 Thoracic aortic aneurysm, without rupture, unspecified: Secondary | ICD-10-CM

## 2017-11-22 ENCOUNTER — Ambulatory Visit
Admission: RE | Admit: 2017-11-22 | Discharge: 2017-11-22 | Disposition: A | Discharge status: Home / Self Care | Payer: Medicare Other | Source: Ambulatory Visit | Attending: Internal Medicine | Admitting: Internal Medicine

## 2017-11-22 DIAGNOSIS — I712 Thoracic aortic aneurysm, without rupture, unspecified: Secondary | ICD-10-CM

## 2017-11-22 MED ORDER — IOPAMIDOL (ISOVUE-370) INJECTION 76%
75.0000 mL | Freq: Once | INTRAVENOUS | Status: AC | PRN
  Administered 2017-11-22: 75 mL via INTRAVENOUS

## 2017-11-30 DIAGNOSIS — D3131 Benign neoplasm of right choroid: Secondary | ICD-10-CM | POA: Diagnosis not present

## 2017-11-30 DIAGNOSIS — H1013 Acute atopic conjunctivitis, bilateral: Secondary | ICD-10-CM | POA: Diagnosis not present

## 2018-01-29 DIAGNOSIS — H61393 Other acquired stenosis of external ear canal, bilateral: Secondary | ICD-10-CM | POA: Diagnosis not present

## 2018-01-29 DIAGNOSIS — H6121 Impacted cerumen, right ear: Secondary | ICD-10-CM | POA: Diagnosis not present

## 2018-01-29 DIAGNOSIS — H903 Sensorineural hearing loss, bilateral: Secondary | ICD-10-CM | POA: Diagnosis not present

## 2018-01-29 DIAGNOSIS — H6123 Impacted cerumen, bilateral: Secondary | ICD-10-CM | POA: Diagnosis not present

## 2018-02-05 ENCOUNTER — Other Ambulatory Visit: Payer: Self-pay | Admitting: Internal Medicine

## 2018-02-05 DIAGNOSIS — J3089 Other allergic rhinitis: Secondary | ICD-10-CM | POA: Diagnosis not present

## 2018-02-05 DIAGNOSIS — J3081 Allergic rhinitis due to animal (cat) (dog) hair and dander: Secondary | ICD-10-CM | POA: Diagnosis not present

## 2018-02-05 DIAGNOSIS — R05 Cough: Secondary | ICD-10-CM | POA: Diagnosis not present

## 2018-02-05 DIAGNOSIS — J301 Allergic rhinitis due to pollen: Secondary | ICD-10-CM | POA: Diagnosis not present

## 2018-02-06 ENCOUNTER — Other Ambulatory Visit: Payer: Self-pay | Admitting: Internal Medicine

## 2018-04-10 DIAGNOSIS — H9113 Presbycusis, bilateral: Secondary | ICD-10-CM | POA: Diagnosis not present

## 2018-04-10 DIAGNOSIS — H903 Sensorineural hearing loss, bilateral: Secondary | ICD-10-CM | POA: Diagnosis not present

## 2018-04-30 DIAGNOSIS — H903 Sensorineural hearing loss, bilateral: Secondary | ICD-10-CM | POA: Diagnosis not present

## 2018-05-07 DIAGNOSIS — R7309 Other abnormal glucose: Secondary | ICD-10-CM | POA: Diagnosis not present

## 2018-05-07 DIAGNOSIS — R82998 Other abnormal findings in urine: Secondary | ICD-10-CM | POA: Diagnosis not present

## 2018-05-07 DIAGNOSIS — I10 Essential (primary) hypertension: Secondary | ICD-10-CM | POA: Diagnosis not present

## 2018-05-07 DIAGNOSIS — E7849 Other hyperlipidemia: Secondary | ICD-10-CM | POA: Diagnosis not present

## 2018-05-14 DIAGNOSIS — F4321 Adjustment disorder with depressed mood: Secondary | ICD-10-CM | POA: Diagnosis not present

## 2018-05-14 DIAGNOSIS — Z6833 Body mass index (BMI) 33.0-33.9, adult: Secondary | ICD-10-CM | POA: Diagnosis not present

## 2018-05-14 DIAGNOSIS — E668 Other obesity: Secondary | ICD-10-CM | POA: Diagnosis not present

## 2018-05-14 DIAGNOSIS — R05 Cough: Secondary | ICD-10-CM | POA: Diagnosis not present

## 2018-05-14 DIAGNOSIS — I712 Thoracic aortic aneurysm, without rupture: Secondary | ICD-10-CM | POA: Diagnosis not present

## 2018-05-14 DIAGNOSIS — J449 Chronic obstructive pulmonary disease, unspecified: Secondary | ICD-10-CM | POA: Diagnosis not present

## 2018-05-14 DIAGNOSIS — I77810 Thoracic aortic ectasia: Secondary | ICD-10-CM | POA: Diagnosis not present

## 2018-05-14 DIAGNOSIS — Z1339 Encounter for screening examination for other mental health and behavioral disorders: Secondary | ICD-10-CM | POA: Diagnosis not present

## 2018-05-14 DIAGNOSIS — D692 Other nonthrombocytopenic purpura: Secondary | ICD-10-CM | POA: Diagnosis not present

## 2018-05-14 DIAGNOSIS — I2721 Secondary pulmonary arterial hypertension: Secondary | ICD-10-CM | POA: Diagnosis not present

## 2018-05-14 DIAGNOSIS — Z0000 Encounter for general adult medical examination without abnormal findings: Secondary | ICD-10-CM | POA: Diagnosis not present

## 2018-05-14 DIAGNOSIS — J479 Bronchiectasis, uncomplicated: Secondary | ICD-10-CM | POA: Diagnosis not present

## 2018-05-20 ENCOUNTER — Telehealth: Payer: Self-pay | Admitting: Internal Medicine

## 2018-05-20 DIAGNOSIS — Z1212 Encounter for screening for malignant neoplasm of rectum: Secondary | ICD-10-CM | POA: Diagnosis not present

## 2018-05-20 NOTE — Telephone Encounter (Signed)
Attempted to call pt but unable to reach. Left message for pt to return call. 

## 2018-05-21 ENCOUNTER — Telehealth: Payer: Self-pay | Admitting: Internal Medicine

## 2018-05-21 NOTE — Telephone Encounter (Signed)
Send script for spiriva respimat once daily and also atroven hfa 4 timmes daily - he can go with cheaper one

## 2018-05-21 NOTE — Telephone Encounter (Signed)
Spoke with the patient. He stated the pharmacy told him the insurance would not cover Incruse. But they will cover ipratropium bromide (generic Atrovent) which is a tier 1 and Spiriva which is a tier 3.  Patient stated he is out of medication and will need this sent as soon as possible to pharmacy.  Message routed to Dr. [name] for review.  Dr. [name], can please let me know if he can take generic Atrovent or brand Spiriva please? Thank you much.

## 2018-05-21 NOTE — Telephone Encounter (Signed)
LMTCB x2 for pt 

## 2018-05-21 NOTE — Telephone Encounter (Signed)
Pt returning call CB#[phone]//kob

## 2018-05-22 MED ORDER — TIOTROPIUM BROMIDE MONOHYDRATE 2.5 MCG/ACT IN AERS: 2.0000 | INHALATION_SPRAY | Freq: Every day | RESPIRATORY_TRACT | 5 refills | Status: DC

## 2018-05-22 NOTE — Telephone Encounter (Signed)
High dose spiriva was sent in by Desmond Dike, [place].  Called and spoke with pt letting him know this had been done. Pt expressed understanding. incruse has been removed of pt's med list. Nothing further needed.

## 2018-05-22 NOTE — Telephone Encounter (Signed)
Spoke with the pt and notified we are waiting for MR to let us know which spiriva dose pt needs  He understands but needs this done asap as he has been out of med x 3 days now  I am forwarding to MR and EP to ensure this gets handled today  Thanks!

## 2018-05-22 NOTE — Telephone Encounter (Signed)
High dose spiriva

## 2018-05-22 NOTE — Addendum Note (Signed)
Addended by: Lorretta Harp on: 05/22/2018 05:23 PM   Modules accepted: Orders

## 2018-05-22 NOTE — Telephone Encounter (Signed)
Attempted to call pt but unable to reach. Left message for pt to return call.  We are awaiting to hear from MR in regards to if we need to send high or low dose of spiriva respimat in to pt's pharmacy along with atrovent hfa due to insurance not covering incruse.

## 2018-05-22 NOTE — Telephone Encounter (Signed)
MR, do you want low dose or high dose spiriva respimat?

## 2018-05-22 NOTE — Telephone Encounter (Signed)
Pt is calling back about Rx for Incruse. Cb is [phone].

## 2018-05-22 NOTE — Telephone Encounter (Signed)
Pt is returning call. Pt has not had medication for three days. Cb is [phone]

## 2018-06-03 DIAGNOSIS — M1612 Unilateral primary osteoarthritis, left hip: Secondary | ICD-10-CM | POA: Diagnosis not present

## 2018-06-03 DIAGNOSIS — M19011 Primary osteoarthritis, right shoulder: Secondary | ICD-10-CM | POA: Diagnosis not present

## 2018-07-08 DIAGNOSIS — M19011 Primary osteoarthritis, right shoulder: Secondary | ICD-10-CM | POA: Diagnosis not present

## 2018-08-09 ENCOUNTER — Other Ambulatory Visit: Payer: Self-pay | Admitting: *Deleted

## 2018-09-03 ENCOUNTER — Ambulatory Visit: Payer: Self-pay | Admitting: *Deleted

## 2018-09-05 ENCOUNTER — Telehealth: Payer: Self-pay

## 2018-09-05 MED ORDER — BUDESONIDE-FORMOTEROL FUMARATE 160-4.5 MCG/ACT IN AERO: 2.0000 | INHALATION_SPRAY | Freq: Two times a day (BID) | RESPIRATORY_TRACT | 5 refills | Status: DC

## 2018-09-05 NOTE — Telephone Encounter (Signed)
Refill of pt's symbicort inhaler has been sent to pt's preferred pharmacy. Called and spoke with pt letting him know this had been done and pt verbalized understanding. Nothing further needed.

## 2018-09-12 ENCOUNTER — Other Ambulatory Visit: Payer: Self-pay | Admitting: *Deleted

## 2018-09-12 NOTE — Patient Outreach (Signed)
[facility]) Care Management  09/12/2018  KAMEREN PARGAS 10/16/1940 [id number]   Case Closure/Transition to [facility] Health/[facility]   Outreach:  Patient case has been transitioned to [facility] Health/CCI for further Care Management assistance.  Plan: RN Health Coach will close case at this time. RN Health Coach will send primary care provider Care Management Case Closure Letter. RN Health Coach will make patient inactive with [facility] Care Management at this time.  [place] [phone] [email or web address]

## 2018-11-12 DIAGNOSIS — I10 Essential (primary) hypertension: Secondary | ICD-10-CM | POA: Diagnosis not present

## 2018-11-12 DIAGNOSIS — I712 Thoracic aortic aneurysm, without rupture: Secondary | ICD-10-CM | POA: Diagnosis not present

## 2018-11-12 DIAGNOSIS — E669 Obesity, unspecified: Secondary | ICD-10-CM | POA: Diagnosis not present

## 2018-11-12 DIAGNOSIS — M159 Polyosteoarthritis, unspecified: Secondary | ICD-10-CM | POA: Diagnosis not present

## 2018-11-12 DIAGNOSIS — J479 Bronchiectasis, uncomplicated: Secondary | ICD-10-CM | POA: Diagnosis not present

## 2018-11-12 DIAGNOSIS — J449 Chronic obstructive pulmonary disease, unspecified: Secondary | ICD-10-CM | POA: Diagnosis not present

## 2018-11-12 DIAGNOSIS — D692 Other nonthrombocytopenic purpura: Secondary | ICD-10-CM | POA: Diagnosis not present

## 2018-11-21 ENCOUNTER — Other Ambulatory Visit: Payer: Self-pay | Admitting: Internal Medicine

## 2018-11-21 DIAGNOSIS — I712 Thoracic aortic aneurysm, without rupture, unspecified: Secondary | ICD-10-CM

## 2018-12-04 ENCOUNTER — Ambulatory Visit
Admission: RE | Admit: 2018-12-04 | Discharge: 2018-12-04 | Disposition: A | Discharge status: Home / Self Care | Payer: PPO | Source: Ambulatory Visit | Attending: Internal Medicine | Admitting: Internal Medicine

## 2018-12-04 DIAGNOSIS — I712 Thoracic aortic aneurysm, without rupture, unspecified: Secondary | ICD-10-CM

## 2018-12-04 MED ORDER — IOPAMIDOL (ISOVUE-370) INJECTION 76%
75.0000 mL | Freq: Once | INTRAVENOUS | Status: AC | PRN
  Administered 2018-12-04: 75 mL via INTRAVENOUS

## 2018-12-16 DIAGNOSIS — J301 Allergic rhinitis due to pollen: Secondary | ICD-10-CM | POA: Diagnosis not present

## 2018-12-16 DIAGNOSIS — J3089 Other allergic rhinitis: Secondary | ICD-10-CM | POA: Diagnosis not present

## 2018-12-16 DIAGNOSIS — J3081 Allergic rhinitis due to animal (cat) (dog) hair and dander: Secondary | ICD-10-CM | POA: Diagnosis not present

## 2018-12-16 DIAGNOSIS — R05 Cough: Secondary | ICD-10-CM | POA: Diagnosis not present

## 2019-01-01 ENCOUNTER — Other Ambulatory Visit: Payer: Self-pay

## 2019-01-01 ENCOUNTER — Ambulatory Visit (INDEPENDENT_AMBULATORY_CARE_PROVIDER_SITE_OTHER): Payer: PPO | Admitting: Internal Medicine

## 2019-01-01 ENCOUNTER — Encounter: Payer: Self-pay | Admitting: Internal Medicine

## 2019-01-01 VITALS — BP 124/70 | HR 74 | Temp 98.1°F | Ht 75.0 in | Wt 274.6 lb

## 2019-01-01 DIAGNOSIS — Z836 Family history of other diseases of the respiratory system: Secondary | ICD-10-CM

## 2019-01-01 DIAGNOSIS — J479 Bronchiectasis, uncomplicated: Secondary | ICD-10-CM

## 2019-01-01 DIAGNOSIS — R05 Cough: Secondary | ICD-10-CM | POA: Diagnosis not present

## 2019-01-01 DIAGNOSIS — R0602 Shortness of breath: Secondary | ICD-10-CM

## 2019-01-01 DIAGNOSIS — R053 Chronic cough: Secondary | ICD-10-CM

## 2019-01-01 DIAGNOSIS — Z87891 Personal history of nicotine dependence: Secondary | ICD-10-CM | POA: Diagnosis not present

## 2019-01-01 DIAGNOSIS — R059 Cough, unspecified: Secondary | ICD-10-CM

## 2019-01-01 NOTE — Patient Instructions (Addendum)
ICD-10-CM   1. Chronic cough  R05   2. Shortness of breath  R06.02   3. 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 Family history of pulmonary fibrosis  Z83.6   5. History of smoking greater than 50 pack years  Z87.891     Cough some worse compared to 1 year ago Need to re-evaluate lung again  Plan  - do HRCT supine and prone  - do spirometry and dlco  - continue spirivia and symbicort as before - use albuterol as needed - CMA to offer flu shot 01/01/2019    Followup  = next few weeks to several weeks but after completing above

## 2019-01-01 NOTE — Progress Notes (Signed)
HPI  IOV 08/24/2017  Chief Complaint  Patient presents with   Consult    Referred by Dr. Antone Hedge due to trouble coughing, coughing up mucus that is dark gray in color, and breathing has become worse. Pt also has c/o hoarseness that began 6 months ago. Pt's main complaint is labored breathing. Pt states he had a brother who died of pulmonary fibrosis.    Noah Huffman , 78 y.o. , with dob [birth year] and male ,Not Hispanic or Latino from [address] [place] [address] - presents to pulm clinic for dyspnea and cough.  He is originally from [place] area and moved to [place] in the late 1960s and in [place] since the 1970s.His brother died at age 39 from pulmonary fibrosis lasted at the [facility] he is also worried about that.  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  He smoked heavily over 62 pack smoking history but quit over 25 years ago.  He tells me for the last 4 to 5 years he has had shortness of breath that is slowly progre  ssive.  Definitely present with exertion relieved by rest.  Walking today in our office 185 feet x 3 laps on room air he did have some mild shortness of breath but did not desaturate.  There is no associated chest pain.    He does have a chronic cough that for which she was started on Symbicort few to several years ago and it did seem to help but in the last year or 2 the cough is deteriorated and associated with some yellow sputum production.  He does have some on and off hoarseness of voice that is constant for the last few years.  He recollects having had a ENT evaluation a few years ago that was normal.  He clears his throat.  There is excess feeling of postnasal drip there is some occasional choking episodes he finds the cough annoying and there is an occasional sensation of something sticking in his throat but there is no heartburn or dysphagia or coughing after lying down.  His RSI cough  score is 19.  He is noticed to be on losartan  In 2017 he did have a plain old exercise treadmill test that was normal.   FenO 08/24/2017 = 22 ppb on spiriva and symbicort.  Recently started on Symbicort in the last few months by Dr. Fredderick Phenix allergist.  Patient tells me that allergy testing was negative/normal    In 2014 he had spirometry that shows both reduced FVC and FEV1 with a reduced ratio slightly favoring obstruction  He had a CT chest aug 2018 that shows some bibasilar atelectasis this was a CT chest angiography.  In 2017 he had a CT abdomen pelvis that does not show these applications.  I personally visualized the CT chest and confirmed the findings.    OV 10/08/2017  Chief Complaint  Patient presents with   Follow-up    PFT done today, clears throat,    Follow-up cough and shortness of breath. He continues to be is on Spiriva and Symbicort. In the interim his symptoms of improvement. He only function test today and his FEV1 is 71% associated with moderate obstruction. DLCO is mildly reduced. Overall he feels great. We discussed pulmonary rehabilitation but he says he will exercise on his own and will think about attending rehabilitation center if x-ray does not help. He had high resolution CT  chest that I personally visualized and it shows bilateral lower lobe bronchiectasis Currently no sputum production.  OV 01/01/2019  Subjective:  Patient ID: Noah Huffman, male , DOB: Mar 22, 1940 , age 78 y.o. , MRN: [id number] , ADDRESS: [place] [id number]   01/01/2019 -   Chief Complaint  Patient presents with   Bronchiectasis without complication    Patient said there are times when the breathing is worse, especially when hot. Recently had chest CT and was told to some to clinic to discuss.   Follow-up bilateral lower lobe bronchiectasis on Spiriva and Symbicort but with a family history of pulmonary fibrosis.  [His brother who is 5 years younger than him  died in 06/24/15 at the age of 8 from pulmonary fibrosis]  HPI Noah Huffman 78 y.o. -presents for follow-up.  Last seen in July 2019.  Since then he has been on Spiriva and Symbicort.  He says the shortness of breath is stable but his chronic cough with sputum production has gotten worse a little bit especially early in the morning.  He is worried he might have pulmonary fibrosis because his brother died from it.  He also had a recent CT chest of the aorta that reported the bronchiectasis.  Therefore his allergist Dr. Fredderick Phenix is asked him to be reevaluated with [place].  Otherwise no new problems.  The only other health issue in the last 1 year is his left hip pain.     Results for Noah Huffman (MRN [id number]) as of 10/08/2017 12:38  Ref. Range 10/08/2017 10:58  FEV1-Post Latest Units: L 2.63  FEV1-%Pred-Post Latest Units: % 71  FEV1-%Change-Post Latest Units: % -7  Post FEV1/FVC ratio Latest Units: % 72  Results for Noah Huffman (MRN [id number]) as of 10/08/2017 12:38  Ref. Range 10/08/2017 10:58  DLCO unc Latest Units: ml/min/mmHg 28.61  DLCO unc % pred Latest Units: % 73    Simple office walk 185 feet x  3 laps goal with forehead probe 08/24/2017   O2 used Room air  Number laps completed 3  Comments about pace Normal pace,, tall man,   Resting Pulse Ox/HR 98% and 61/min  Final Pulse Ox/HR 96% and 88/min  Desaturated </= 88% no  Desaturated <= 3% points no  Got Tachycardic >/= 90/min no  Symptoms at end of test Mild dyspneic  Miscellaneous comments x     ROS - per HPI     has a past medical history of Allergy, Asthma, BPH (benign prostatic hypertrophy) with urinary obstruction, Cataract, Diverticulosis, GERD (gastroesophageal reflux disease), Hyperglycemia, Hyperlipidemia, Hypertension, Inguinal hernia, Neuromuscular disorder ([place]), Obesity, OSA (obstructive sleep apnea) (06/06/2013), Osteoarthritis, and Sleep apnea.   reports that he quit smoking about 30 years ago. His  smoking use included cigarettes. He has a 62.50 pack-year smoking history. He has never used smokeless tobacco.  Past Surgical History:  Procedure Laterality Date   APPENDECTOMY     arthroscopic knee surgery     CATARACT EXTRACTION  24-Jun-2010   bilateral   COLONOSCOPY     HAMMER TOE SURGERY     INGUINAL HERNIA REPAIR     right   POLYPECTOMY     TONSILLECTOMY AND ADENOIDECTOMY     TOTAL HIP ARTHROPLASTY  06/23/2005   right    Allergies  Allergen Reactions   Morphine And Related Rash    Immunization History  Administered Date(s) Administered   Influenza,inj,Quad PF,6+ Mos 01/14/2017    Family History  Problem Relation  Age of Onset   Leukemia Father    Transient ischemic attack Father    Pneumonia Father    Colon cancer Mother 11   Brain cancer Brother    Pulmonary fibrosis Brother    Colon polyps Neg Hx    Esophageal cancer Neg Hx    Rectal cancer Neg Hx    Stomach cancer Neg Hx      Current Outpatient Medications:    albuterol (VENTOLIN HFA) 108 (90 Base) MCG/ACT inhaler, INHALE 2 PUFFS PO Q 4 H PRN, Disp: , Rfl:    budesonide-formoterol (SYMBICORT) 160-4.5 MCG/ACT inhaler, Inhale 2 puffs into the lungs 2 (two) times a day., Disp: 10.2 g, Rfl: 5   cetirizine (ZYRTEC) 10 MG tablet, TK 1 T PO QD PRN, Disp: , Rfl: 4   gabapentin (NEURONTIN) 600 MG tablet, TK 2 TS PO TID, Disp: , Rfl: 6   losartan-hydrochlorothiazide (HYZAAR) 100-25 MG tablet, Take 1 tablet by mouth daily., Disp: , Rfl:    omeprazole (PRILOSEC) 20 MG capsule, TK 1 C PO QD 30 MIN B BRE, Disp: , Rfl:    rosuvastatin (CRESTOR) 10 MG tablet, , Disp: , Rfl:    Tiotropium Bromide Monohydrate (SPIRIVA RESPIMAT) 2.5 MCG/ACT AERS, Inhale 2 puffs into the lungs daily., Disp: 1 Inhaler, Rfl: 5  Current Facility-Administered Medications:    0.9 %  sodium chloride infusion, 500 mL, Intravenous, Once, Irene Shipper, MD      Objective:   Vitals:   01/01/19 1100  BP: 124/70  Pulse: 74    Temp: 98.1 F (36.7 C)  SpO2: 95%  Weight: 274 lb 9.6 oz (124.6 kg)  Height: 6\' 3"  (1.905 m)    Estimated body mass index is 34.32 kg/m as calculated from the following:   Height as of this encounter: 6\' 3"  (1.905 m).   Weight as of this encounter: 274 lb 9.6 oz (124.6 kg).  @WEIGHTCHANGE @  [employer]   01/01/19 1100  Weight: 274 lb 9.6 oz (124.6 kg)     Physical Exam  General Appearance:    Alert, cooperative, no distress, appears stated age - yes , Deconditioned looking - no , OBESE  - yes, Sitting on Wheelchair -  no  Head:    Normocephalic, without obvious abnormality, atraumatic  Eyes:    PERRL, conjunctiva/corneas clear,  Ears:    Normal TM's and external ear canals, both ears  Nose:   Nares normal, septum midline, mucosa normal, no drainage    or sinus tenderness. OXYGEN ON  - no . Patient is @ ra   Throat:   Lips, mucosa, and tongue normal; teeth and gums normal. Cyanosis on lips - no  Neck:   Supple, symmetrical, trachea midline, no adenopathy;    thyroid:  no enlargement/tenderness/nodules; no carotid   bruit or JVD  Back:     Symmetric, no curvature, ROM normal, no CVA tenderness  Lungs:     Distress - no , Wheeze no, Barrell Chest - no, Purse lip breathing - no, Crackles - yes at base   Chest Wall:    No tenderness or deformity.    Heart:    Regular rate and rhythm, S1 and S2 normal, no rub   or gallop, Murmur - no  Breast Exam:    NOT DONE  Abdomen:     Soft, non-tender, bowel sounds active all four quadrants,    no masses, no organomegaly. Visceral obesity - yues  Genitalia:   NOT DONE  Rectal:  NOT DONE  Extremities:   Extremities - normal, Has Cane - no, Clubbing - no, Edema - no  Pulses:   2+ and symmetric all extremities  Skin:   Stigmata of Connective Tissue Disease - no  Lymph nodes:   Cervical, supraclavicular, and axillary nodes normal  Psychiatric:  Neurologic:   Pleasant - yesn, Anxious - no, Flat affect - no  CAm-ICU - neg, Alert and  Oriented x 3 - yes, Moves all 4s - yes, Speech - normal, Cognition - intact           Assessment:       ICD-10-CM   1. Chronic cough  R05   2. Shortness of breath  R06.02   3. Bronchiectasis without complication ([place])  A999333   4. Family history of pulmonary fibrosis  Z83.6   5. History of smoking greater than 50 pack years  Z87.891        Plan:     Patient Instructions     ICD-10-CM   1. Chronic cough  R05   2. Shortness of breath  R06.02   3. Bronchiectasis without complication ([place])  A999333   4. Family history of pulmonary fibrosis  Z83.6   5. History of smoking greater than 50 pack years  Z87.891     Cough some worse compared to 1 year ago Need to re-evaluate lung again  Plan  - do HRCT supine and prone  - do spirometry and dlco  - continue spirivia and symbicort as before - use albuterol as needed - CMA to offer flu shot 01/01/2019    Followup  = next few weeks to several weeks but after completing above     SIGNATURE    Dr. Brand Males, M.D., F.C.C.P,  Pulmonary and Critical Care Medicine Staff Physician, [place] Director - Interstitial Lung Disease  Program  P[facility] [place] [facility] [place], [place], [address]  Pager: [phone], If no answer or between  15:00h - 7:00h: call [phone] Telephone: [phone]  11:37 AM 01/01/2019

## 2019-01-07 ENCOUNTER — Telehealth: Payer: Self-pay | Admitting: Internal Medicine

## 2019-01-07 NOTE — Telephone Encounter (Signed)
Scheduled at [facility] on 11/6 @ 3:30.  Pt aware of appt.  Nothing further needed at this time.

## 2019-01-07 NOTE — Telephone Encounter (Signed)
Spoke to patient.  Working on this.

## 2019-01-14 DIAGNOSIS — H353131 Nonexudative age-related macular degeneration, bilateral, early dry stage: Secondary | ICD-10-CM | POA: Diagnosis not present

## 2019-01-17 ENCOUNTER — Other Ambulatory Visit (HOSPITAL_COMMUNITY)
Admission: RE | Admit: 2019-01-17 | Discharge: 2019-01-17 | Disposition: A | Discharge status: Home / Self Care | Payer: PPO | Source: Ambulatory Visit | Attending: Internal Medicine | Admitting: Internal Medicine

## 2019-01-17 DIAGNOSIS — Z01812 Encounter for preprocedural laboratory examination: Secondary | ICD-10-CM | POA: Diagnosis not present

## 2019-01-17 DIAGNOSIS — Z20828 Contact with and (suspected) exposure to other viral communicable diseases: Secondary | ICD-10-CM | POA: Diagnosis not present

## 2019-01-18 LAB — NOVEL CORONAVIRUS, NAA (HOSP ORDER, SEND-OUT TO REF LAB; TAT 18-24 HRS): SARS-CoV-2, NAA: NOT DETECTED

## 2019-01-21 ENCOUNTER — Other Ambulatory Visit: Payer: Self-pay

## 2019-01-21 ENCOUNTER — Ambulatory Visit (HOSPITAL_COMMUNITY)
Admission: RE | Admit: 2019-01-21 | Discharge: 2019-01-21 | Disposition: A | Discharge status: Home / Self Care | Payer: PPO | Source: Ambulatory Visit | Attending: Internal Medicine | Admitting: Internal Medicine

## 2019-01-21 DIAGNOSIS — R0602 Shortness of breath: Secondary | ICD-10-CM

## 2019-01-21 DIAGNOSIS — R05 Cough: Secondary | ICD-10-CM | POA: Insufficient documentation

## 2019-01-21 DIAGNOSIS — Z836 Family history of other diseases of the respiratory system: Secondary | ICD-10-CM | POA: Diagnosis not present

## 2019-01-21 DIAGNOSIS — Z87891 Personal history of nicotine dependence: Secondary | ICD-10-CM | POA: Insufficient documentation

## 2019-01-21 DIAGNOSIS — J479 Bronchiectasis, uncomplicated: Secondary | ICD-10-CM | POA: Diagnosis not present

## 2019-01-21 DIAGNOSIS — R053 Chronic cough: Secondary | ICD-10-CM

## 2019-01-21 LAB — PULMONARY FUNCTION TEST
DL/VA % pred: 108 %
DL/VA: 4.2 ml/min/mmHg/L
DLCO unc % pred: 88 %
DLCO unc: 25.55 ml/min/mmHg
FEF 25-75 Post: 1.47 L/sec
FEF 25-75 Pre: 0.97 L/sec
FEF2575-%Change-Post: 52 %
FEF2575-%Pred-Post: 56 %
FEF2575-%Pred-Pre: 37 %
FEV1-%Change-Post: 7 %
FEV1-%Pred-Post: 70 %
FEV1-%Pred-Pre: 65 %
FEV1-Post: 2.58 L
FEV1-Pre: 2.4 L
FEV1FVC-%Change-Post: 8 %
FEV1FVC-%Pred-Pre: 89 %
FEV6-%Change-Post: 2 %
FEV6-%Pred-Post: 75 %
FEV6-%Pred-Pre: 73 %
FEV6-Post: 3.56 L
FEV6-Pre: 3.47 L
FEV6FVC-%Change-Post: 3 %
FEV6FVC-%Pred-Post: 102 %
FEV6FVC-%Pred-Pre: 98 %
FVC-%Change-Post: -1 %
FVC-%Pred-Post: 73 %
FVC-%Pred-Pre: 74 %
FVC-Post: 3.69 L
FVC-Pre: 3.73 L
Post FEV1/FVC ratio: 70 %
Post FEV6/FVC ratio: 97 %
Pre FEV1/FVC ratio: 64 %
Pre FEV6/FVC Ratio: 93 %
RV % pred: 139 %
RV: 4.01 L
TLC % pred: 98 %
TLC: 7.91 L

## 2019-01-21 MED ORDER — ALBUTEROL SULFATE (2.5 MG/3ML) 0.083% IN NEBU
2.5000 mg | INHALATION_SOLUTION | Freq: Once | RESPIRATORY_TRACT | Status: AC
  Administered 2019-01-21: 2.5 mg via RESPIRATORY_TRACT

## 2019-01-24 ENCOUNTER — Other Ambulatory Visit: Payer: Self-pay

## 2019-01-24 ENCOUNTER — Ambulatory Visit (INDEPENDENT_AMBULATORY_CARE_PROVIDER_SITE_OTHER)
Admission: RE | Admit: 2019-01-24 | Discharge: 2019-01-24 | Disposition: A | Discharge status: Home / Self Care | Payer: PPO | Source: Ambulatory Visit | Attending: Internal Medicine | Admitting: Internal Medicine

## 2019-01-24 DIAGNOSIS — Z836 Family history of other diseases of the respiratory system: Secondary | ICD-10-CM

## 2019-01-24 DIAGNOSIS — R0602 Shortness of breath: Secondary | ICD-10-CM | POA: Diagnosis not present

## 2019-01-24 DIAGNOSIS — J479 Bronchiectasis, uncomplicated: Secondary | ICD-10-CM

## 2019-01-24 DIAGNOSIS — R05 Cough: Secondary | ICD-10-CM | POA: Diagnosis not present

## 2019-01-24 DIAGNOSIS — R053 Chronic cough: Secondary | ICD-10-CM

## 2019-01-24 DIAGNOSIS — Z87891 Personal history of nicotine dependence: Secondary | ICD-10-CM

## 2019-01-24 DIAGNOSIS — R059 Cough, unspecified: Secondary | ICD-10-CM

## 2019-01-27 ENCOUNTER — Telehealth: Payer: Self-pay | Admitting: Internal Medicine

## 2019-01-27 NOTE — Telephone Encounter (Signed)
RAdiology tech -> asked patient "how long do you have pulmonary fibrosis" and this then scared him and he got upset. I clarified to him  That as of current CT our radiologist is NOT calling it fibrosis and we will discuss 01/28/19 at a visit and Dec 2020 MDD

## 2019-01-28 ENCOUNTER — Ambulatory Visit (INDEPENDENT_AMBULATORY_CARE_PROVIDER_SITE_OTHER): Payer: PPO | Admitting: Internal Medicine

## 2019-01-28 ENCOUNTER — Other Ambulatory Visit: Payer: Self-pay

## 2019-01-28 ENCOUNTER — Encounter: Payer: Self-pay | Admitting: Internal Medicine

## 2019-01-28 VITALS — BP 126/72 | HR 63 | Ht 75.0 in | Wt 280.0 lb

## 2019-01-28 DIAGNOSIS — R490 Dysphonia: Secondary | ICD-10-CM

## 2019-01-28 DIAGNOSIS — R0602 Shortness of breath: Secondary | ICD-10-CM | POA: Diagnosis not present

## 2019-01-28 DIAGNOSIS — R05 Cough: Secondary | ICD-10-CM | POA: Diagnosis not present

## 2019-01-28 DIAGNOSIS — Z836 Family history of other diseases of the respiratory system: Secondary | ICD-10-CM

## 2019-01-28 DIAGNOSIS — J479 Bronchiectasis, uncomplicated: Secondary | ICD-10-CM

## 2019-01-28 DIAGNOSIS — Z87891 Personal history of nicotine dependence: Secondary | ICD-10-CM

## 2019-01-28 DIAGNOSIS — R053 Chronic cough: Secondary | ICD-10-CM

## 2019-01-28 NOTE — Progress Notes (Signed)
IOV 08/24/2017  Chief Complaint  Patient presents with  . Consult    Referred by Dr. Antonino Hedge due to trouble coughing, coughing up mucus that is dark gray in color, and breathing has become worse. Pt also has c/o hoarseness that began 6 months ago. Pt's main complaint is labored breathing. Pt states he had a brother who died of pulmonary fibrosis.    Noah Huffman , 78 y.o. , with dob Mar 21, 1940 and male ,Not Hispanic or Latino from [address] [place] [address] - presents to pulm clinic for dyspnea and cough.  He is originally from [place] area and moved to [place] in the late 1960s and in [place] since the 1970s.His brother died at age 70 from pulmonary fibrosis lasted at the [facility] he is also worried about that.  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  He smoked heavily over 62 pack smoking history but quit over 25 years ago.  He tells me for the last 4 to 5 years he has had shortness of breath that is slowly progre  ssive.  Definitely present with exertion relieved by rest.  Walking today in our office 185 feet x 3 laps on room air he did have some mild shortness of breath but did not desaturate.  There is no associated chest pain.    He does have a chronic cough that for which she was started on Symbicort few to several years ago and it did seem to help but in the last year or 2 the cough is deteriorated and associated with some yellow sputum production.  He does have some on and off hoarseness of voice that is constant for the last few years.  He recollects having had a ENT evaluation a few years ago that was normal.  He clears his throat.  There is excess feeling of postnasal drip there is some occasional choking episodes he finds the cough annoying and there is an occasional sensation of something sticking in his throat but there is no heartburn or dysphagia or coughing after lying down.  His RSI cough score  is 19.  He is noticed to be on losartan  In 2017 he did have a plain old exercise treadmill test that was normal.   FenO 08/24/2017 = 22 ppb on spiriva and symbicort.  Recently started on Symbicort in the last few months by Dr. Fredderick Phenix allergist.  Patient tells me that allergy testing was negative/normal    In 2014 he had spirometry that shows both reduced FVC and FEV1 with a reduced ratio slightly favoring obstruction  He had a CT chest aug 2018 that shows some bibasilar atelectasis this was a CT chest angiography.  In 2017 he had a CT abdomen pelvis that does not show these applications.  I personally visualized the CT chest and confirmed the findings.    OV 10/08/2017  Chief Complaint  Patient presents with  . Follow-up    PFT done today, clears throat,    Follow-up cough and shortness of breath. He continues to be is on Spiriva and Symbicort. In the interim his symptoms of improvement. He only function test today and his FEV1 is 71% associated with moderate obstruction. DLCO is mildly reduced. Overall he feels great. We discussed pulmonary rehabilitation but he says he will exercise on his own and will think about attending rehabilitation center if x-ray does not help. He had high resolution CT chest  that I personally visualized and it shows bilateral lower lobe bronchiectasis Currently no sputum production.  OV 01/01/2019  Subjective:  Patient ID: Noah Huffman, male , DOB: January 13, 1941 , age 65 y.o. , MRN: [id number] , ADDRESS: [place] [id number]   01/01/2019 -   Chief Complaint  Patient presents with  . Bronchiectasis without complication    Patient said there are times when the breathing is worse, especially when hot. Recently had chest CT and was told to some to clinic to discuss.   Follow-up bilateral lower lobe bronchiectasis on Spiriva and Symbicort but with a family history of pulmonary fibrosis.  [His brother who is 5 years younger than him died in  06/17/15 at the age of 64 from pulmonary fibrosis]  HPI Noah Huffman 78 y.o. -presents for follow-up.  Last seen in July 2019.  Since then he has been on Spiriva and Symbicort.  He says the shortness of breath is stable but his chronic cough with sputum production has gotten worse a little bit especially early in the morning.  He is worried he might have pulmonary fibrosis because his brother died from it.  He also had a recent CT chest of the aorta that reported the bronchiectasis.  Therefore his allergist Dr. Fredderick Phenix is asked him to be reevaluated with [place].  Otherwise no new problems.  The only other health issue in the last 1 year is his left hip pain.        OV 01/28/2019  Subjective:  Patient ID: Noah Huffman, male , DOB: February 10, 1941 , age 33 y.o. , MRN: [id number] , ADDRESS: [place] [id number]   01/28/2019 -   Chief Complaint  Patient presents with  . Follow-up    PFT and CT chest performed since last visit and pt is here today to go over the results. Pt states his cough is still the same since last visit.   Follow-up bilateral lower lobe bronchiectasis on Spiriva and Symbicort but with a family history of pulmonary fibrosis.  [His brother who is 5 years younger than him died in 2015-06-17 at the age of 44 from pulmonary fibrosis]  HPI Noah Huffman 78 y.o. -returns for follow-up of her pulmonary function testing and high-resolution CT chest.  The high-resolution CT chest is again described as bronchiectasis in the bilateral lower lobes.  To me it looks a little bit like reticulation on my personal visualization and I wonder if there is early fibrosis.  Of note, somebody in radiology told him that he had pulmonary fibrosis and he got upset.  He is very scared about getting pulmonary fibrosis because his brother died from the same.  Regardless the level of radiologic abnormality appears to be very mild.  His pulmonary function test itself is stable compared to a year  ago.  However his symptoms appear to be out of proportion to the pulmonary findings on the CT scan.  He tells me that his cough is severe RSI cough score is 27.  He clears his throat a lot.  He feels like there is lot of phlegm in the throat.  When I asked him if there is a sensation there is a "frog in the throat" he did replied in the affirmative.  He clears her throat a lot.  He did that even during the office visit.  But the cough is stable compared to a year ago  His shortness of breath he states is worse.  Class  III on exertion relieved by rest.  He has significant visceral obesity.  He says his been tested for sleep apnea 3 times in the past but this has been negative.  His last cardiac stress test was 2017 a plain old treadmill test that I reviewed the result and was normal.  It has been several years since he had an echocardiogram.  He is willing to have another echocardiogram and if this is normal undergo pulmonary stress test.  He is reporting new onset of hoarseness of voice for the last 1 year.  It seems to be intermittent.  He states his wife is also getting it.  He has previous history of smoking.  Has been on Symbicort for 7 years and Spiriva recently.  He has not seen ENT in several years or never seen them at all.    Dr Lorenza Cambridge Reflux Symptom Index (> 13-15 suggestive of LPR cough) 0 -> 5  =  none ->severe problem 01/28/2019   Hoarseness of problem with voice 4  Clearing  Of Throat 4  Excess throat mucus or feeling of post nasal drip 4  Difficulty swallowing food, liquid or tablets 1  Cough after eating or lying down 3  Breathing difficulties or choking episodes 3  Troublesome or annoying cough 2  Sensation of something sticking in throat or lump in throat 3  Heartburn, chest pain, indigestion, or stomach acid coming up 3  TOTAL 27     Simple office walk 185 feet x  3 laps goal with forehead probe 08/24/2017   O2 used Room air  Number laps completed 3  Comments about  pace Normal pace,, tall man,   Resting Pulse Ox/HR 98% and 61/min  Final Pulse Ox/HR 96% and 88/min  Desaturated </= 88% no  Desaturated <= 3% points no  Got Tachycardic >/= 90/min no  Symptoms at end of test Mild dyspneic  Miscellaneous comments x   Results for BEAUMONT, WHITENIGHT (MRN [id number]) as of 01/28/2019 10:58  Ref. Range 10/08/2017 11:44 01/21/2019 13:47  FVC-Pre Latest Units: L 4.03 3.73  FVC-%Pred-Pre Latest Units: % 79 74  Results for HARSHDEEP, SUMAN (MRN [id number]) as of 01/28/2019 10:58  Ref. Range 10/08/2017 11:44 01/21/2019 13:47  TLC Latest Units: L 6.82 7.91  TLC % pred Latest Units: % 84 98  Results for EISEN, SILVERMAN (MRN [id number]) as of 01/28/2019 10:58  Ref. Range 10/08/2017 11:44 01/21/2019 13:47  DLCO unc Latest Units: ml/min/mmHg 28.61 25.55  DLCO unc % pred Latest Units: % 73 88    CT chest high resolution  IMPRESSION: 1. No evidence of interstitial lung disease. 2. Mild bibasilar bronchiectasis and scarring, as before. 3. Aortic atherosclerosis (ICD10-170.0). Coronary artery calcification.   Electronically Signed   By: Lorin Picket M.D.   On: 01/24/2019 16:29   ROS - per HPI     has a past medical history of Allergy, Asthma, BPH (benign prostatic hypertrophy) with urinary obstruction, Cataract, Diverticulosis, GERD (gastroesophageal reflux disease), Hyperglycemia, Hyperlipidemia, Hypertension, Inguinal hernia, Neuromuscular disorder ([place]), Obesity, OSA (obstructive sleep apnea) (06/06/2013), Osteoarthritis, and Sleep apnea.   reports that he quit smoking about 30 years ago. His smoking use included cigarettes. He has a 62.50 pack-year smoking history. He has never used smokeless tobacco.  Past Surgical History:  Procedure Laterality Date  . APPENDECTOMY    . arthroscopic knee surgery    . CATARACT EXTRACTION  2012   bilateral  . COLONOSCOPY    . HAMMER TOE SURGERY    .  INGUINAL HERNIA REPAIR     right  . POLYPECTOMY    .  TONSILLECTOMY AND ADENOIDECTOMY    . TOTAL HIP ARTHROPLASTY  2007   right    Allergies  Allergen Reactions  . Morphine And Related Rash    Immunization History  Administered Date(s) Administered  . Fluad Quad(high Dose 65+) 12/02/2018  . Influenza,inj,Quad PF,6+ Mos 01/14/2017    Family History  Problem Relation Age of Onset  . Leukemia Father   . Transient ischemic attack Father   . Pneumonia Father   . Colon cancer Mother 40  . Brain cancer Brother   . Pulmonary fibrosis Brother   . Colon polyps Neg Hx   . Esophageal cancer Neg Hx   . Rectal cancer Neg Hx   . Stomach cancer Neg Hx      Current Outpatient Medications:  .  albuterol (VENTOLIN HFA) 108 (90 Base) MCG/ACT inhaler, INHALE 2 PUFFS PO Q 4 H PRN, Disp: , Rfl:  .  budesonide-formoterol (SYMBICORT) 160-4.5 MCG/ACT inhaler, Inhale 2 puffs into the lungs 2 (two) times a day., Disp: 10.2 g, Rfl: 5 .  cetirizine (ZYRTEC) 10 MG tablet, TK 1 T PO QD PRN, Disp: , Rfl: 4 .  gabapentin (NEURONTIN) 600 MG tablet, TK 2 TS PO TID, Disp: , Rfl: 6 .  losartan-hydrochlorothiazide (HYZAAR) 100-25 MG tablet, Take 1 tablet by mouth daily., Disp: , Rfl:  .  omeprazole (PRILOSEC) 20 MG capsule, TK 1 C PO QD 30 MIN B BRE, Disp: , Rfl:  .  rosuvastatin (CRESTOR) 10 MG tablet, , Disp: , Rfl:  .  Tiotropium Bromide Monohydrate (SPIRIVA RESPIMAT) 2.5 MCG/ACT AERS, Inhale 2 puffs into the lungs daily., Disp: 1 Inhaler, Rfl: 5  Current Facility-Administered Medications:  .  0.9 %  sodium chloride infusion, 500 mL, Intravenous, Once, Irene Shipper, MD      Objective:   Vitals:   01/28/19 1047  BP: 126/72  Pulse: 63  SpO2: 95%  Weight: 280 lb (127 kg)  Height: 6\' 3"  (1.905 m)    Estimated body mass index is 35 kg/m as calculated from the following:   Height as of this encounter: 6\' 3"  (1.905 m).   Weight as of this encounter: 280 lb (127 kg).  @WEIGHTCHANGE @  Filed Weights   01/28/19 1047  Weight: 280 lb (127 kg)      Physical Exam  General Appearance:    Alert, cooperative, no distress, appears stated age - yes , Deconditioned looking - no , OBESE  - yes, Sitting on Wheelchair -  no  Head:    Normocephalic, without obvious abnormality, atraumatic  Eyes:    PERRL, conjunctiva/corneas clear,  Ears:    Normal TM's and external ear canals, both ears  Nose:   Nares normal, septum midline, mucosa normal, no drainage    or sinus tenderness. OXYGEN ON  - no . Patient is @ ra   Throat:   Lips, mucosa, and tongue normal; teeth and gums normal. Cyanosis on lips - no  Neck:   Supple, symmetrical, trachea midline, no adenopathy;    thyroid:  no enlargement/tenderness/nodules; no carotid   bruit or JVD  Back:     Symmetric, no curvature, ROM normal, no CVA tenderness  Lungs:     Distress - no , Wheeze no, Barrell Chest - no, Purse lip breathing - no, Crackles - MAYBE at base   Chest Wall:    No tenderness or deformity.    Heart:  Regular rate and rhythm, S1 and S2 normal, no rub   or gallop, Murmur - no  Breast Exam:    NOT DONE  Abdomen:     Soft, non-tender, bowel sounds active all four quadrants,    no masses, no organomegaly. Visceral obesity - no  Genitalia:   NOT DONE  Rectal:   NOT DONE  Extremities:   Extremities - normal, Has Cane - no, Clubbing - no, Edema - no  Pulses:   2+ and symmetric all extremities  Skin:   Stigmata of Connective Tissue Disease - no  Lymph nodes:   Cervical, supraclavicular, and axillary nodes normal  Psychiatric:  Neurologic:   Pleasant - yes, Anxious - no, Flat affect - no  CAm-ICU - neg, Alert and Oriented x 3 - yes, Moves all 4s - yes, Speech - normal, Cognition - intact           Assessment:       ICD-10-CM   1. Family history of pulmonary fibrosis  Z83.6   2. Bronchiectasis without complication ([place])  A999333   3. Shortness of breath  R06.02   4. Chronic cough  R05   5. History of smoking greater than 50 pack years  Z87.891   6. Hoarse voice quality  R49.0         Plan:     Patient Instructions  Family history of pulmonary fibrosis Bronchiectasis without complication ([place])  - will discuss dec 2020 case conference if there is pulmonary fibrosis or not - regardless it is mild and stable 2019 ->. 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 This includes one or more of the following documented above: discussion of test results, diagnostic or treatment recommendations, prognosis, risks and benefits of management options, instructions, education, compliance or risk-factor reduction   SIGNATURE    Dr. Brand Males, M.D., F.C.C.P,  Pulmonary and Critical Care Medicine Staff Physician, [place] Director - Interstitial Lung Disease  Program  Pulmonary [place] [facility] [place], [place], [address]  Pager: [phone], If no answer or between  15:00h - 7:00h: call [phone] Telephone: [phone]  11:24 AM 01/28/2019

## 2019-01-28 NOTE — Patient Instructions (Addendum)
Family history of pulmonary fibrosis Bronchiectasis without complication ([place])  - will discuss dec 2020 case conference if there is pulmonary fibrosis or not - regardless it is mild and stable 2019 ->. 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

## 2019-02-05 ENCOUNTER — Other Ambulatory Visit (HOSPITAL_COMMUNITY): Payer: PPO

## 2019-02-11 ENCOUNTER — Ambulatory Visit (HOSPITAL_COMMUNITY): Payer: PPO | Attending: Cardiology

## 2019-02-11 ENCOUNTER — Encounter (INDEPENDENT_AMBULATORY_CARE_PROVIDER_SITE_OTHER): Payer: Self-pay

## 2019-02-11 ENCOUNTER — Other Ambulatory Visit: Payer: Self-pay

## 2019-02-11 DIAGNOSIS — R0602 Shortness of breath: Secondary | ICD-10-CM | POA: Insufficient documentation

## 2019-02-18 DIAGNOSIS — H353131 Nonexudative age-related macular degeneration, bilateral, early dry stage: Secondary | ICD-10-CM | POA: Diagnosis not present

## 2019-02-18 DIAGNOSIS — Z961 Presence of intraocular lens: Secondary | ICD-10-CM | POA: Diagnosis not present

## 2019-03-04 ENCOUNTER — Telehealth: Payer: Self-pay | Admitting: Internal Medicine

## 2019-03-04 DIAGNOSIS — R0602 Shortness of breath: Secondary | ICD-10-CM

## 2019-03-04 NOTE — Telephone Encounter (Signed)
Let Noah Huffman  -  know that I discussed his Ct on our case conference earlier this month - this radiologist also agreed no concrete evidence of pulmonary fibrosis  - his echo is normal but for mild heart muscle stiffness. This could be contributing to his dyspnea. Given his normal treadmill stress test. He could now do a pulmnary stress test (on bike wit eib challenge) to get to the bottom of his dyspnea  - he can return to see me after his cpst  - can be done in jan/feb 2021

## 2019-03-05 DIAGNOSIS — R05 Cough: Secondary | ICD-10-CM | POA: Diagnosis not present

## 2019-03-05 DIAGNOSIS — J343 Hypertrophy of nasal turbinates: Secondary | ICD-10-CM | POA: Diagnosis not present

## 2019-03-05 DIAGNOSIS — J342 Deviated nasal septum: Secondary | ICD-10-CM | POA: Diagnosis not present

## 2019-03-05 DIAGNOSIS — H6123 Impacted cerumen, bilateral: Secondary | ICD-10-CM | POA: Diagnosis not present

## 2019-03-05 DIAGNOSIS — J310 Chronic rhinitis: Secondary | ICD-10-CM | POA: Diagnosis not present

## 2019-03-05 NOTE — Telephone Encounter (Signed)
Called and spoke with pt letting him know the info stated by MR and that he wants him to have CPST to see if we can figure out why he is still SOB. Pt verbalized understanding.  Scheduled pt for OV with MR 04/09/2019 so that way the CPST could be scheduled before pt's appt. Order has been placed for the CPST.  Nothing further needed.

## 2019-04-09 ENCOUNTER — Ambulatory Visit (INDEPENDENT_AMBULATORY_CARE_PROVIDER_SITE_OTHER): Payer: PPO | Admitting: Internal Medicine

## 2019-04-09 ENCOUNTER — Encounter: Payer: Self-pay | Admitting: Internal Medicine

## 2019-04-09 ENCOUNTER — Other Ambulatory Visit: Payer: Self-pay

## 2019-04-09 VITALS — BP 160/64 | HR 75 | Temp 97.3°F | Ht 74.0 in | Wt 281.6 lb

## 2019-04-09 DIAGNOSIS — R0602 Shortness of breath: Secondary | ICD-10-CM | POA: Diagnosis not present

## 2019-04-09 DIAGNOSIS — Z87891 Personal history of nicotine dependence: Secondary | ICD-10-CM | POA: Diagnosis not present

## 2019-04-09 DIAGNOSIS — R490 Dysphonia: Secondary | ICD-10-CM

## 2019-04-09 DIAGNOSIS — J479 Bronchiectasis, uncomplicated: Secondary | ICD-10-CM

## 2019-04-09 DIAGNOSIS — R05 Cough: Secondary | ICD-10-CM

## 2019-04-09 DIAGNOSIS — R053 Chronic cough: Secondary | ICD-10-CM

## 2019-04-09 DIAGNOSIS — Z836 Family history of other diseases of the respiratory system: Secondary | ICD-10-CM | POA: Diagnosis not present

## 2019-04-09 NOTE — Patient Instructions (Addendum)
Family history of pulmonary fibrosis Bronchiectasis without complication ([place]) Previous 60 pack smoking history  -Per case conference in December 2020 there is no evidence of pulmonary fibrosis but you have bronchiectasis  Plan  -Check ANA, double-stranded DNA, rheumatoid factor, CCP, SSA, SSB, SCL-70, blood IgE, blood IgM, blood IgA and blood IgG -we will call with the results  -Continue current inhaler therapy -Spiriva and Symbicort as before  -We will readdress need for this at follow-up   Shortness of breath - out of proportion to lung findings  -Likely drug due to weight gain, visceral obesity, loss of physical fitness and diastolic dysfunction which is stiff heart muscle  Plan  -Hold off on pulmonary stress test -Encourage reentry into physical activity and weight loss    Chronic cough - again out of proportion to lung fnmdings 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

## 2019-04-09 NOTE — Progress Notes (Signed)
IOV 08/24/2017  Chief Complaint  Patient presents with  . Consult    Referred by Dr. Takuya Hedge due to trouble coughing, coughing up mucus that is dark gray in color, and breathing has become worse. Pt also has c/o hoarseness that began 6 months ago. Pt's main complaint is labored breathing. Pt states he had a brother who died of pulmonary fibrosis.    Noah Huffman , 79 y.o. , with dob 09/29/1940 and male ,Not Hispanic or Latino from [address] [place] [address] - presents to pulm clinic for dyspnea and cough.  He is originally from [place] area and moved to [place] in the late 1960s and in [place] since the 1970s.His brother died at age 54 from pulmonary fibrosis lasted at the [facility] he is also worried about that.  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  He smoked heavily over 62 pack smoking history but quit over 25 years ago.  He tells me for the last 4 to 5 years he has had shortness of breath that is slowly progre  ssive.  Definitely present with exertion relieved by rest.  Walking today in our office 185 feet x 3 laps on room air he did have some mild shortness of breath but did not desaturate.  There is no associated chest pain.    He does have a chronic cough that for which she was started on Symbicort few to several years ago and it did seem to help but in the last year or 2 the cough is deteriorated and associated with some yellow sputum production.  He does have some on and off hoarseness of voice that is constant for the last few years.  He recollects having had a ENT evaluation a few years ago that was normal.  He clears his throat.  There is excess feeling of postnasal drip there is some occasional choking episodes he finds the cough annoying and there is an occasional sensation of something sticking in his throat but there is no heartburn or dysphagia or coughing after lying down.  His RSI cough  score is 19.  He is noticed to be on losartan  In 2017 he did have a plain old exercise treadmill test that was normal.   FenO 08/24/2017 = 22 ppb on spiriva and symbicort.  Recently started on Symbicort in the last few months by Dr. Fredderick Phenix allergist.  Patient tells me that allergy testing was negative/normal    In 2014 he had spirometry that shows both reduced FVC and FEV1 with a reduced ratio slightly favoring obstruction  He had a CT chest aug 2018 that shows some bibasilar atelectasis this was a CT chest angiography.  In 2017 he had a CT abdomen pelvis that does not show these applications.  I personally visualized the CT chest and confirmed the findings.    OV 10/08/2017  Chief Complaint  Patient presents with  . Follow-up    PFT done today, clears throat,    Follow-up cough and shortness of breath. He continues to be is on Spiriva and Symbicort. In the interim his symptoms of improvement. He only function test today and his FEV1 is 71% associated with moderate obstruction. DLCO is mildly reduced. Overall he feels great. We discussed pulmonary rehabilitation but he says he will exercise on his own and will think about attending rehabilitation center if x-ray does not help. He had high resolution CT  chest that I personally visualized and it shows bilateral lower lobe bronchiectasis Currently no sputum production.  OV 01/01/2019  Subjective:  Patient ID: Noah Huffman, male , DOB: [birth year] , age 46 y.o. , MRN: [id number] , ADDRESS: [place] [address]   01/01/2019 -   Chief Complaint  Patient presents with  . Bronchiectasis without complication    Patient said there are times when the breathing is worse, especially when hot. Recently had chest CT and was told to some to clinic to discuss.   Follow-up bilateral lower lobe bronchiectasis on Spiriva and Symbicort but with a family history of pulmonary fibrosis.  [His brother who is 5 years younger than him  died in Jun 26, 2015 at the age of 90 from pulmonary fibrosis]  HPI Noah Huffman 79 y.o. -presents for follow-up.  Last seen in July 2019.  Since then he has been on Spiriva and Symbicort.  He says the shortness of breath is stable but his chronic cough with sputum production has gotten worse a little bit especially early in the morning.  He is worried he might have pulmonary fibrosis because his brother died from it.  He also had a recent CT chest of the aorta that reported the bronchiectasis.  Therefore his allergist Dr. Fredderick Phenix is asked him to be reevaluated with [place].  Otherwise no new problems.  The only other health issue in the last 1 year is his left hip pain.        OV 01/28/2019  Subjective:  Patient ID: Noah Huffman, male , DOB: 09/21/1940 , age 20 y.o. , MRN: [id number] , ADDRESS: [place] [id number]   01/28/2019 -   Chief Complaint  Patient presents with  . Follow-up    PFT and CT chest performed since last visit and pt is here today to go over the results. Pt states his cough is still the same since last visit.   Follow-up bilateral lower lobe bronchiectasis on Spiriva and Symbicort but with a family history of pulmonary fibrosis.  [His brother who is 5 years younger than him died in Jun 26, 2015 at the age of 74 from pulmonary fibrosis]  HPI Noah Huffman 79 y.o. -returns for follow-up of her pulmonary function testing and high-resolution CT chest.  The high-resolution CT chest is again described as bronchiectasis in the bilateral lower lobes.  To me it looks a little bit like reticulation on my personal visualization and I wonder if there is early fibrosis.  Of note, somebody in radiology told him that he had pulmonary fibrosis and he got upset.  He is very scared about getting pulmonary fibrosis because his brother died from the same.  Regardless the level of radiologic abnormality appears to be very mild.  His pulmonary function test itself is stable compared to  a year ago.  However his symptoms appear to be out of proportion to the pulmonary findings on the CT scan.  He tells me that his cough is severe RSI cough score is 27.  He clears his throat a lot.  He feels like there is lot of phlegm in the throat.  When I asked him if there is a sensation there is a "frog in the throat" he did replied in the affirmative.  He clears her throat a lot.  He did that even during the office visit.  But the cough is stable compared to a year ago  His shortness of breath he states is worse.  Class III on exertion relieved by rest.  He has significant visceral obesity.  He says his been tested for sleep apnea 3 times in the past but this has been negative.  His last cardiac stress test was 12-Jul-2015 a plain old treadmill test that I reviewed the result and was normal.  It has been several years since he had an echocardiogram.  He is willing to have another echocardiogram and if this is normal undergo pulmonary stress test.  He is reporting new onset of hoarseness of voice for the last 1 year.  It seems to be intermittent.  He states his wife is also getting it.  He has previous history of smoking.  Has been on Symbicort for 7 years and Spiriva recently.  He has not seen ENT in several years or never seen them at all.    CT chest high resolution  IMPRESSION: 1. No evidence of interstitial lung disease. 2. Mild bibasilar bronchiectasis and scarring, as before. 3. Aortic atherosclerosis (ICD10-170.0). Coronary artery calcification.   Electronically Signed   By: Lorin Picket M.D.   On: 01/24/2019 16:29  OV 04/09/2019  Subjective:  Patient ID: Noah Huffman, male , DOB: [birth year] , age 64 y.o. , MRN: [id number] , ADDRESS: [address] [place] [address]  Follow-up bilateral lower lobe bronchiectasis on Spiriva and Symbicort but with a family history of pulmonary fibrosis.  [His brother who is 5 years younger than him died in 07/12/15 at the age of 66 from  pulmonary fibrosis]   04/09/2019 -   Chief Complaint  Patient presents with  . Follow-up    f/u bronchiectasis, minimal cough with phlegm with cloudy mucus     HPI Noah Huffman 79 y.o. -presents for his follow-up of multiple issues.  He is on Spiriva and Symbicort but despite this he has significant amount of cough he is clearing his throat all the time including even at this visit.  He says he has associated dry mouth.  He is already on pre-existing gabapentin.  Med review shows losartan.  He saw Dr. Lorelee Cover and ENT after my referral to him at the last visit.  Apparently has dry mouth and the trying some humidification.  He is some better with his cough.  His chronic shortness of breath continues.  He had an echocardiogram that shows diastolic dysfunction.  He now tells me that because of the pandemic he stopped exercising at the [facility] and he regained 20 pounds and this then contributed the shortness of breath.  He initially agreed for pulmonary stress test but after discussing this with him we think the most likely etiology is obesity, diastolic dysfunction physical deconditioning therefore going to hold off.  I discussed his bilateral lower lobe bronchiectasis at the case conference.  He is very concerned about pulmonary fibrosis because his brother passed away from it.  He also has a previous 60 pack smoking and therefore he is a high risk candidate for pulmonary fibrosis especially being male, Caucasian and age greater than 2.  However the case conference it was resolved that he only had bronchiectasis and no evidence of fibrosis.  Review of the labs indicate that he still needs to have bronchiectasis work-up     Dr Lorenza Cambridge Reflux Symptom Index (> 13-15 suggestive of LPR cough)  01/28/2019   Hoarseness of problem with voice 4  Clearing  Of Throat 4  Excess throat mucus or feeling of post nasal drip 4  Difficulty swallowing food, liquid or  tablets 1  Cough after eating or lying down 3   Breathing difficulties or choking episodes 3  Troublesome or annoying cough 2  Sensation of something sticking in throat or lump in throat 3  Heartburn, chest pain, indigestion, or stomach acid coming up 3  TOTAL 27     Simple office walk 185 feet x  3 laps goal with forehead probe 08/24/2017   O2 used Room air  Number laps completed 3  Comments about pace Normal pace,, tall man,   Resting Pulse Ox/HR 98% and 61/min  Final Pulse Ox/HR 96% and 88/min  Desaturated </= 88% no  Desaturated <= 3% points no  Got Tachycardic >/= 90/min no  Symptoms at end of test Mild dyspneic  Miscellaneous comments x   Results for Noah Huffman, Noah Huffman (MRN [id number]) as of 01/28/2019 10:58  Ref. Range 10/08/2017 11:44 01/21/2019 13:47  FVC-Pre Latest Units: L 4.03 3.73  FVC-%Pred-Pre Latest Units: % 79 74  Results for Noah Huffman, Noah Huffman (MRN [id number]) as of 01/28/2019 10:58  Ref. Range 10/08/2017 11:44 01/21/2019 13:47  TLC Latest Units: L 6.82 7.91  TLC % pred Latest Units: % 84 98  Results for Noah Huffman, Noah Huffman (MRN [id number]) as of 01/28/2019 10:58  Ref. Range 10/08/2017 11:44 01/21/2019 13:47  DLCO unc Latest Units: ml/min/mmHg 28.61 25.55  DLCO unc % pred Latest Units: % 73 88    ROS - per HPI     has a past medical history of Allergy, Asthma, BPH (benign prostatic hypertrophy) with urinary obstruction, Cataract, Diverticulosis, GERD (gastroesophageal reflux disease), Hyperglycemia, Hyperlipidemia, Hypertension, Inguinal hernia, Neuromuscular disorder ([place]), Obesity, OSA (obstructive sleep apnea) (06/06/2013), Osteoarthritis, and Sleep apnea.   reports that he quit smoking about 31 years ago. His smoking use included cigarettes. He has a 62.50 pack-year smoking history. He has never used smokeless tobacco.  Past Surgical History:  Procedure Laterality Date  . APPENDECTOMY    . arthroscopic knee surgery    . CATARACT EXTRACTION  2012   bilateral  . COLONOSCOPY    . HAMMER TOE SURGERY    .  INGUINAL HERNIA REPAIR     right  . POLYPECTOMY    . TONSILLECTOMY AND ADENOIDECTOMY    . TOTAL HIP ARTHROPLASTY  2007   right    Allergies  Allergen Reactions  . Morphine And Related Rash    Immunization History  Administered Date(s) Administered  . Fluad Quad(high Dose 65+) 12/02/2018  . Influenza,inj,Quad PF,6+ Mos 01/14/2017    Family History  Problem Relation Age of Onset  . Leukemia Father   . Transient ischemic attack Father   . Pneumonia Father   . Colon cancer Mother 18  . Brain cancer Brother   . Pulmonary fibrosis Brother   . Colon polyps Neg Hx   . Esophageal cancer Neg Hx   . Rectal cancer Neg Hx   . Stomach cancer Neg Hx      Current Outpatient Medications:  .  albuterol (VENTOLIN HFA) 108 (90 Base) MCG/ACT inhaler, INHALE 2 PUFFS PO Q 4 H PRN, Disp: , Rfl:  .  budesonide-formoterol (SYMBICORT) 160-4.5 MCG/ACT inhaler, Inhale 2 puffs into the lungs 2 (two) times a day., Disp: 10.2 g, Rfl: 5 .  cetirizine (ZYRTEC) 10 MG tablet, TK 1 T PO QD PRN, Disp: , Rfl: 4 .  gabapentin (NEURONTIN) 600 MG tablet, TK 2 TS PO TID, Disp: , Rfl: 6 .  losartan-hydrochlorothiazide (HYZAAR) 100-25 MG tablet, Take 1 tablet by mouth daily., Disp: ,  Rfl:  .  omeprazole (PRILOSEC) 20 MG capsule, TK 1 C PO QD 30 MIN B BRE, Disp: , Rfl:  .  rosuvastatin (CRESTOR) 10 MG tablet, , Disp: , Rfl:  .  Tiotropium Bromide Monohydrate (SPIRIVA RESPIMAT) 2.5 MCG/ACT AERS, Inhale 2 puffs into the lungs daily., Disp: 1 Inhaler, Rfl: 5  Current Facility-Administered Medications:  .  0.9 %  sodium chloride infusion, 500 mL, Intravenous, Once, Irene Shipper, MD      Objective:   Vitals:   04/09/19 1106  BP: (!) 160/64  Pulse: 75  Temp: (!) 97.3 F (36.3 C)  TempSrc: Oral  SpO2: 97%  Weight: 281 lb 9.6 oz (127.7 kg)  Height: 6\' 2"  (1.88 m)    Estimated body mass index is 36.16 kg/m as calculated from the following:   Height as of this encounter: 6\' 2"  (1.88 m).   Weight as of  this encounter: 281 lb 9.6 oz (127.7 kg).  @WEIGHTCHANGE @  [employer]   04/09/19 1106  Weight: 281 lb 9.6 oz (127.7 kg)     Physical Exam  General Appearance:    Alert, cooperative, no distress, appears stated age - yes , Deconditioned looking - no , OBESE  - yes, Sitting on Wheelchair -  no  Head:    Normocephalic, without obvious abnormality, atraumatic  Eyes:    PERRL, conjunctiva/corneas clear,  Ears:    Normal TM's and external ear canals, both ears  Nose:   Nares normal, septum midline, mucosa normal, no drainage    or sinus tenderness. OXYGEN ON  - no . Patient is @ ra   Throat:   Lips, mucosa, and tongue normal; teeth and gums normal. Cyanosis on lips - no  Neck:   Supple, symmetrical, trachea midline, no adenopathy;    thyroid:  no enlargement/tenderness/nodules; no carotid   bruit or JVD  Back:     Symmetric, no curvature, ROM normal, no CVA tenderness  Lungs:     Distress - no , Wheeze no, Barrell Chest - no, Purse lip breathing - no, Crackles - yes at base   Chest Wall:    No tenderness or deformity.    Heart:    Regular rate and rhythm, S1 and S2 normal, no rub   or gallop, Murmur - no  Breast Exam:    NOT DONE  Abdomen:     Soft, non-tender, bowel sounds active all four quadrants,    no masses, no organomegaly. Visceral obesity - yes  Genitalia:   NOT DONE  Rectal:   NOT DONE  Extremities:   Extremities - normal, Has Cane - no, Clubbing - no, Edema - no  Pulses:   2+ and symmetric all extremities  Skin:   Stigmata of Connective Tissue Disease - no  Lymph nodes:   Cervical, supraclavicular, and axillary nodes normal  Psychiatric:  Neurologic:   Pleasant - yes, Anxious - no, Flat affect - no  CAm-ICU - neg, Alert and Oriented x 3 - yes, Moves all 4s - yes, Speech - normal, Cognition - intact           Assessment:       ICD-10-CM   1. Family history of pulmonary fibrosis  Z83.6   2. Bronchiectasis without complication ([place])  A999333   3. Shortness of  breath  R06.02   4. Chronic cough  R05   5. Hoarse voice quality  R49.0   6. History of smoking greater than 50 pack years  Z87.891  Plan:     Patient Instructions  Family history of pulmonary fibrosis Bronchiectasis without complication ([place]) Previous 60 pack smoking history  -Per case conference in December 2020 there is no evidence of pulmonary fibrosis but you have bronchiectasis  Plan  -Check ANA, double-stranded DNA, rheumatoid factor, CCP, SSA, SSB, SCL-70, blood IgE, blood IgM, blood IgA and blood IgG -we will call with the results  -Continue current inhaler therapy -Spiriva and Symbicort as before  -We will readdress need for this at follow-up   Shortness of breath - out of proportion to lung findings  -Likely drug due to weight gain, visceral obesity, loss of physical fitness and diastolic dysfunction which is stiff heart muscle  Plan  -Hold off on pulmonary stress test -Encourage reentry into physical activity and weight loss    Chronic cough - again out of proportion to lung fnmdings Could 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 This includes one or more of the following all delivered on this same day 04/09/2019: pre-charting, chart review, note writing, documentation discussion of test results, diagnostic or treatment recommendations, prognosis, risks and benefits of management options, instructions, education, compliance or risk-factor reduction. It  excludes time spent by the [place] or office staff in the care of the patient. Actual time was 25 min)   SIGNATURE    Dr. Brand Males, M.D., F.C.C.P,  Pulmonary and Critical Care Medicine Staff Physician, [place] Director - Interstitial Lung Disease  Program  Pulmonary [place] at [place], [place], [address]  Pager: [phone], If no answer or between  15:00h - 7:00h: call [phone] Telephone: [phone]  11:51 AM 04/09/2019

## 2019-04-10 LAB — CYCLIC CITRUL PEPTIDE ANTIBODY, IGG: Cyclic Citrullin Peptide Ab: <16 UNITS

## 2019-04-10 LAB — SJOGREN'S SYNDROME ANTIBODS(SSA + SSB)
SSA (Ro) (ENA) Antibody, IgG: <1 AI
SSB (La) (ENA) Antibody, IgG: <1 AI

## 2019-04-10 LAB — IGG, IGA, IGM
IgG (Immunoglobin G), Serum: 811 mg/dL (ref 600–1540)
IgM, Serum: 97 mg/dL (ref 50–300)
Immunoglobulin A: 152 mg/dL (ref 70–320)

## 2019-04-10 LAB — ANA W/REFLEX: Anti Nuclear Antibody (ANA): NEGATIVE

## 2019-04-10 LAB — IGE: IgE (Immunoglobulin E), Serum: 203 kU/L — ABNORMAL HIGH (ref <=114)

## 2019-04-10 LAB — RHEUMATOID FACTOR: Rheumatoid fact SerPl-aCnc: <14 IU/mL (ref <14)

## 2019-04-10 LAB — ANTI-SCLERODERMA ANTIBODY: Scleroderma (Scl-70) (ENA) Antibody, IgG: <1 AI

## 2019-04-10 LAB — ANTI-DNA ANTIBODY, DOUBLE-STRANDED: ds DNA Ab: <1 IU/mL

## 2019-05-06 DIAGNOSIS — J342 Deviated nasal septum: Secondary | ICD-10-CM | POA: Diagnosis not present

## 2019-05-06 DIAGNOSIS — K219 Gastro-esophageal reflux disease without esophagitis: Secondary | ICD-10-CM | POA: Diagnosis not present

## 2019-05-06 DIAGNOSIS — J343 Hypertrophy of nasal turbinates: Secondary | ICD-10-CM | POA: Diagnosis not present

## 2019-05-06 DIAGNOSIS — R490 Dysphonia: Secondary | ICD-10-CM | POA: Diagnosis not present

## 2019-05-06 DIAGNOSIS — H903 Sensorineural hearing loss, bilateral: Secondary | ICD-10-CM | POA: Diagnosis not present

## 2019-05-13 DIAGNOSIS — R739 Hyperglycemia, unspecified: Secondary | ICD-10-CM | POA: Diagnosis not present

## 2019-05-13 DIAGNOSIS — Z125 Encounter for screening for malignant neoplasm of prostate: Secondary | ICD-10-CM | POA: Diagnosis not present

## 2019-05-13 DIAGNOSIS — E7849 Other hyperlipidemia: Secondary | ICD-10-CM | POA: Diagnosis not present

## 2019-05-15 ENCOUNTER — Ambulatory Visit: Payer: PPO | Attending: Internal Medicine

## 2019-05-15 DIAGNOSIS — Z23 Encounter for immunization: Secondary | ICD-10-CM | POA: Insufficient documentation

## 2019-05-15 NOTE — Progress Notes (Signed)
   Covid-19 Vaccination Clinic  Name:  Noah Huffman    MRN: [id number] DOB: 1940-04-16  05/15/2019  [name] was observed post Covid-19 immunization for 15 minutes without incidence. He was provided with Vaccine Information Sheet and instruction to access the V-Safe system.   [name] was instructed to call 911 with any severe reactions post vaccine: Marland Kitchen Difficulty breathing  . Swelling of your face and throat  . A fast heartbeat  . A bad rash all over your body  . Dizziness and weakness    Immunizations Administered    Name Date Dose VIS Date Route   Pfizer COVID-19 Vaccine 05/15/2019  2:10 PM 0.3 mL 02/28/2019 Intramuscular   Manufacturer: [place]   Lot: [id number]   [place]: [id number]

## 2019-05-20 DIAGNOSIS — F4321 Adjustment disorder with depressed mood: Secondary | ICD-10-CM | POA: Diagnosis not present

## 2019-05-20 DIAGNOSIS — R252 Cramp and spasm: Secondary | ICD-10-CM | POA: Diagnosis not present

## 2019-05-20 DIAGNOSIS — Z0000 Encounter for general adult medical examination without abnormal findings: Secondary | ICD-10-CM | POA: Diagnosis not present

## 2019-05-20 DIAGNOSIS — I2721 Secondary pulmonary arterial hypertension: Secondary | ICD-10-CM | POA: Diagnosis not present

## 2019-05-20 DIAGNOSIS — I712 Thoracic aortic aneurysm, without rupture: Secondary | ICD-10-CM | POA: Diagnosis not present

## 2019-05-20 DIAGNOSIS — J479 Bronchiectasis, uncomplicated: Secondary | ICD-10-CM | POA: Diagnosis not present

## 2019-05-20 DIAGNOSIS — M159 Polyosteoarthritis, unspecified: Secondary | ICD-10-CM | POA: Diagnosis not present

## 2019-05-20 DIAGNOSIS — R05 Cough: Secondary | ICD-10-CM | POA: Diagnosis not present

## 2019-05-20 DIAGNOSIS — D692 Other nonthrombocytopenic purpura: Secondary | ICD-10-CM | POA: Diagnosis not present

## 2019-05-20 DIAGNOSIS — J449 Chronic obstructive pulmonary disease, unspecified: Secondary | ICD-10-CM | POA: Diagnosis not present

## 2019-05-20 DIAGNOSIS — I10 Essential (primary) hypertension: Secondary | ICD-10-CM | POA: Diagnosis not present

## 2019-05-20 DIAGNOSIS — H3530 Unspecified macular degeneration: Secondary | ICD-10-CM | POA: Diagnosis not present

## 2019-05-20 DIAGNOSIS — Z7689 Persons encountering health services in other specified circumstances: Secondary | ICD-10-CM | POA: Diagnosis not present

## 2019-06-04 DIAGNOSIS — M25551 Pain in right hip: Secondary | ICD-10-CM | POA: Diagnosis not present

## 2019-06-04 DIAGNOSIS — M25562 Pain in left knee: Secondary | ICD-10-CM | POA: Diagnosis not present

## 2019-06-09 DIAGNOSIS — H6123 Impacted cerumen, bilateral: Secondary | ICD-10-CM | POA: Diagnosis not present

## 2019-06-09 DIAGNOSIS — M25551 Pain in right hip: Secondary | ICD-10-CM | POA: Diagnosis not present

## 2019-06-10 ENCOUNTER — Ambulatory Visit: Payer: PPO | Attending: Internal Medicine

## 2019-06-10 DIAGNOSIS — Z23 Encounter for immunization: Secondary | ICD-10-CM

## 2019-06-10 NOTE — Progress Notes (Signed)
   Covid-19 Vaccination Clinic  Name:  Noah Huffman    MRN: [id number] DOB: 1940/04/25  06/10/2019  [name] was observed post Covid-19 immunization for 15 minutes without incident. He was provided with Vaccine Information Sheet and instruction to access the V-Safe system.   [name] was instructed to call 911 with any severe reactions post vaccine: Marland Kitchen Difficulty breathing  . Swelling of face and throat  . A fast heartbeat  . A bad rash all over body  . Dizziness and weakness   Immunizations Administered    Name Date Dose VIS Date Route   Pfizer COVID-19 Vaccine 06/10/2019  9:03 AM 0.3 mL 02/28/2019 Intramuscular   Manufacturer: [place]   Lot: [id number]   [place]: [id number]

## 2019-06-13 DIAGNOSIS — M25551 Pain in right hip: Secondary | ICD-10-CM | POA: Diagnosis not present

## 2019-06-13 DIAGNOSIS — Z96641 Presence of right artificial hip joint: Secondary | ICD-10-CM | POA: Diagnosis not present

## 2019-06-13 DIAGNOSIS — Z1212 Encounter for screening for malignant neoplasm of rectum: Secondary | ICD-10-CM | POA: Diagnosis not present

## 2019-06-17 DIAGNOSIS — M19011 Primary osteoarthritis, right shoulder: Secondary | ICD-10-CM | POA: Diagnosis not present

## 2019-06-17 DIAGNOSIS — M25511 Pain in right shoulder: Secondary | ICD-10-CM | POA: Diagnosis not present

## 2019-06-28 DIAGNOSIS — M25511 Pain in right shoulder: Secondary | ICD-10-CM | POA: Diagnosis not present

## 2019-07-03 DIAGNOSIS — Z96641 Presence of right artificial hip joint: Secondary | ICD-10-CM | POA: Diagnosis not present

## 2019-07-03 DIAGNOSIS — M25551 Pain in right hip: Secondary | ICD-10-CM | POA: Diagnosis not present

## 2019-07-08 DIAGNOSIS — M19011 Primary osteoarthritis, right shoulder: Secondary | ICD-10-CM | POA: Diagnosis not present

## 2019-07-08 DIAGNOSIS — M25511 Pain in right shoulder: Secondary | ICD-10-CM | POA: Diagnosis not present

## 2019-09-02 DIAGNOSIS — H524 Presbyopia: Secondary | ICD-10-CM | POA: Diagnosis not present

## 2019-09-02 DIAGNOSIS — H353131 Nonexudative age-related macular degeneration, bilateral, early dry stage: Secondary | ICD-10-CM | POA: Diagnosis not present

## 2019-09-02 DIAGNOSIS — Z961 Presence of intraocular lens: Secondary | ICD-10-CM | POA: Diagnosis not present

## 2019-09-02 DIAGNOSIS — H52203 Unspecified astigmatism, bilateral: Secondary | ICD-10-CM | POA: Diagnosis not present

## 2019-11-10 NOTE — Progress Notes (Signed)
Autoimmune normal. IgE slight high. Will discuss Sept 2021 visit. Overall reassuirng

## 2019-11-18 ENCOUNTER — Encounter (HOSPITAL_COMMUNITY): Payer: Self-pay | Admitting: Physician Assistant

## 2019-11-18 ENCOUNTER — Other Ambulatory Visit: Payer: Self-pay

## 2019-11-18 ENCOUNTER — Ambulatory Visit (HOSPITAL_COMMUNITY)
Admission: RE | Admit: 2019-11-18 | Discharge: 2019-11-18 | Disposition: A | Discharge status: Home / Self Care | Payer: PPO | Source: Ambulatory Visit | Attending: Physician Assistant | Admitting: Physician Assistant

## 2019-11-18 VITALS — BP 150/70 | HR 90 | Ht 74.0 in | Wt 278.0 lb

## 2019-11-18 DIAGNOSIS — K5790 Diverticulosis of intestine, part unspecified, without perforation or abscess without bleeding: Secondary | ICD-10-CM | POA: Diagnosis not present

## 2019-11-18 DIAGNOSIS — D6869 Other thrombophilia: Secondary | ICD-10-CM | POA: Diagnosis not present

## 2019-11-18 DIAGNOSIS — Z9049 Acquired absence of other specified parts of digestive tract: Secondary | ICD-10-CM | POA: Insufficient documentation

## 2019-11-18 DIAGNOSIS — Z7951 Long term (current) use of inhaled steroids: Secondary | ICD-10-CM | POA: Diagnosis not present

## 2019-11-18 DIAGNOSIS — N138 Other obstructive and reflux uropathy: Secondary | ICD-10-CM | POA: Insufficient documentation

## 2019-11-18 DIAGNOSIS — J984 Other disorders of lung: Secondary | ICD-10-CM | POA: Diagnosis not present

## 2019-11-18 DIAGNOSIS — N401 Enlarged prostate with lower urinary tract symptoms: Secondary | ICD-10-CM | POA: Insufficient documentation

## 2019-11-18 DIAGNOSIS — I10 Essential (primary) hypertension: Secondary | ICD-10-CM | POA: Diagnosis not present

## 2019-11-18 DIAGNOSIS — G4733 Obstructive sleep apnea (adult) (pediatric): Secondary | ICD-10-CM | POA: Insufficient documentation

## 2019-11-18 DIAGNOSIS — Z6835 Body mass index (BMI) 35.0-35.9, adult: Secondary | ICD-10-CM | POA: Diagnosis not present

## 2019-11-18 DIAGNOSIS — E669 Obesity, unspecified: Secondary | ICD-10-CM | POA: Diagnosis not present

## 2019-11-18 DIAGNOSIS — I77810 Thoracic aortic ectasia: Secondary | ICD-10-CM | POA: Diagnosis not present

## 2019-11-18 DIAGNOSIS — Z885 Allergy status to narcotic agent status: Secondary | ICD-10-CM | POA: Diagnosis not present

## 2019-11-18 DIAGNOSIS — Z87891 Personal history of nicotine dependence: Secondary | ICD-10-CM | POA: Diagnosis not present

## 2019-11-18 DIAGNOSIS — G629 Polyneuropathy, unspecified: Secondary | ICD-10-CM | POA: Diagnosis not present

## 2019-11-18 DIAGNOSIS — J449 Chronic obstructive pulmonary disease, unspecified: Secondary | ICD-10-CM | POA: Diagnosis not present

## 2019-11-18 DIAGNOSIS — I483 Typical atrial flutter: Secondary | ICD-10-CM | POA: Diagnosis not present

## 2019-11-18 DIAGNOSIS — Z79899 Other long term (current) drug therapy: Secondary | ICD-10-CM | POA: Diagnosis not present

## 2019-11-18 DIAGNOSIS — E785 Hyperlipidemia, unspecified: Secondary | ICD-10-CM | POA: Insufficient documentation

## 2019-11-18 DIAGNOSIS — Z96641 Presence of right artificial hip joint: Secondary | ICD-10-CM | POA: Diagnosis not present

## 2019-11-18 DIAGNOSIS — Z9842 Cataract extraction status, left eye: Secondary | ICD-10-CM | POA: Diagnosis not present

## 2019-11-18 DIAGNOSIS — K219 Gastro-esophageal reflux disease without esophagitis: Secondary | ICD-10-CM | POA: Insufficient documentation

## 2019-11-18 DIAGNOSIS — I4891 Unspecified atrial fibrillation: Secondary | ICD-10-CM | POA: Insufficient documentation

## 2019-11-18 DIAGNOSIS — Z9841 Cataract extraction status, right eye: Secondary | ICD-10-CM | POA: Diagnosis not present

## 2019-11-18 DIAGNOSIS — Z7901 Long term (current) use of anticoagulants: Secondary | ICD-10-CM | POA: Diagnosis not present

## 2019-11-18 DIAGNOSIS — M25511 Pain in right shoulder: Secondary | ICD-10-CM | POA: Diagnosis not present

## 2019-11-18 DIAGNOSIS — I712 Thoracic aortic aneurysm, without rupture: Secondary | ICD-10-CM | POA: Diagnosis not present

## 2019-11-18 DIAGNOSIS — I4892 Unspecified atrial flutter: Secondary | ICD-10-CM | POA: Diagnosis not present

## 2019-11-18 DIAGNOSIS — I2721 Secondary pulmonary arterial hypertension: Secondary | ICD-10-CM | POA: Diagnosis not present

## 2019-11-18 DIAGNOSIS — G609 Hereditary and idiopathic neuropathy, unspecified: Secondary | ICD-10-CM | POA: Diagnosis not present

## 2019-11-18 MED ORDER — APIXABAN 5 MG PO TABS: 5.0000 mg | ORAL_TABLET | Freq: Two times a day (BID) | ORAL | 3 refills | Status: DC

## 2019-11-18 NOTE — Progress Notes (Signed)
Primary Care Physician: [name], MD Primary Cardiologist: Dr Percival Spanish (remotely) Primary Electrophysiologist: none Referring Physician: Dr Pricilla Handler is a 79 y.o. male with a history of HTN, HLD, mild OSA, GERD, thoracic aortic aneurysm, COPD, and new onset atrial flutter who presents for consultation in the [facility]. The patient was initially diagnosed with atrial fibrillation 11/18/19 at his PCP office after presenting for a preoperative evaluation for shoulder surgery. ECG showed rate controlled atrial flutter. He was unaware of his arrhythmia although he does admit that he has had infrequent and brief palpitations over the years. Patient has a CHADS2VASC score of 3. He does admit to drinking one alcoholic drink daily. He has had 2 sleep studies in the past which have shown borderline OSA and CPA was not recommended. His SOB has been chronic and stable for years.   Today, he denies symptoms of palpitions, chest pain, orthopnea, PND, lower extremity edema, dizziness, presyncope, syncope, snoring, daytime somnolence, bleeding, or neurologic sequela. The patient is tolerating medications without difficulties and is otherwise without complaint today.    Atrial Fibrillation Risk Factors:  he does have symptoms or diagnosis of sleep apnea. CPAP not recommended.  he does not have a history of rheumatic fever. he does have a history of alcohol use. The patient does not have a history of early familial atrial fibrillation or other arrhythmias.  he has a BMI of Body mass index is 35.69 kg/m.Marland Kitchen Filed Weights   11/18/19 1409  Weight: 126.1 kg    Family History  Problem Relation Age of Onset  . Leukemia Father   . Transient ischemic attack Father   . Pneumonia Father   . Colon cancer Mother 31  . Brain cancer Brother   . Pulmonary fibrosis Brother   . Colon polyps Neg Hx   . Esophageal cancer Neg Hx   . Rectal cancer Neg Hx   . Stomach  cancer Neg Hx      Atrial Fibrillation Management history:  Previous antiarrhythmic drugs: none Previous cardioversions: none Previous ablations: none CHADS2VASC score: 3 Anticoagulation history: none   Past Medical History:  Diagnosis Date  . Allergy    seasonal  . Asthma   . BPH (benign prostatic hypertrophy) with urinary obstruction   . Cataract    removed both eyes  . Diverticulosis   . GERD (gastroesophageal reflux disease)   . Hyperglycemia   . Hyperlipidemia   . Hypertension   . Inguinal hernia   . Neuromuscular disorder (HCC)    neuropathy in feet   . Obesity   . OSA (obstructive sleep apnea) 06/06/2013  . Osteoarthritis   . Sleep apnea    no cpap   Past Surgical History:  Procedure Laterality Date  . APPENDECTOMY    . arthroscopic knee surgery    . CATARACT EXTRACTION  2012   bilateral  . COLONOSCOPY    . HAMMER TOE SURGERY    . INGUINAL HERNIA REPAIR     right  . POLYPECTOMY    . TONSILLECTOMY AND ADENOIDECTOMY    . TOTAL HIP ARTHROPLASTY  2007   right    Current Outpatient Medications  Medication Sig Dispense Refill  . albuterol (VENTOLIN HFA) 108 (90 Base) MCG/ACT inhaler INHALE 2 PUFFS PO Q 4 H PRN    . budesonide-formoterol (SYMBICORT) 160-4.5 MCG/ACT inhaler Inhale 2 puffs into the lungs 2 (two) times a day. 10.2 g 5  . cetirizine (ZYRTEC) 10 MG tablet  TK 1 T PO QD PRN  4  . gabapentin (NEURONTIN) 600 MG tablet TK 2 TS PO TID  6  . HYDROcodone-acetaminophen (NORCO/VICODIN) 5-325 MG tablet Take 1 tablet by mouth every 6 (six) hours.    Marland Kitchen losartan-hydrochlorothiazide (HYZAAR) 100-25 MG tablet Take 1 tablet by mouth daily.    . Multiple Vitamins-Minerals (PRESERVISION AREDS 2) CAPS Take by mouth.    Marland Kitchen omeprazole (PRILOSEC) 20 MG capsule TK 1 C PO QD 30 MIN B BRE    . rosuvastatin (CRESTOR) 10 MG tablet     . Tiotropium Bromide Monohydrate (SPIRIVA RESPIMAT) 2.5 MCG/ACT AERS Inhale 2 puffs into the lungs daily. 1 Inhaler 5  . apixaban (ELIQUIS)  5 MG TABS tablet Take 1 tablet (5 mg total) by mouth 2 (two) times daily. 60 tablet 3   Current Facility-Administered Medications  Medication Dose Route Frequency Provider Last Rate Last Admin  . 0.9 %  sodium chloride infusion  500 mL Intravenous Once Irene Shipper, MD        Allergies  Allergen Reactions  . Morphine And Related Rash    Social History   Socioeconomic History  . Marital status: Married    Spouse name: Not on file  . Number of children: 2  . Years of education: Not on file  . Highest education level: Not on file  Occupational History  . Occupation: retired  Tobacco Use  . Smoking status: Former Smoker    Packs/day: 2.50    Years: 25.00    Pack years: 62.50    Types: Cigarettes    Quit date: 03/20/1988    Years since quitting: 31.6  . Smokeless tobacco: Never Used  Substance and Sexual Activity  . Alcohol use: Yes    Alcohol/week: 1.0 standard drink    Types: 1 Shots of liquor per week    Comment: 1-2 drinks per day or less   . Drug use: No  . Sexual activity: Not on file  Other Topics Concern  . Not on file  Social History Narrative  . Not on file   Social Determinants of Health   Financial Resource Strain:   . Difficulty of Paying Living Expenses: Not on file  Food Insecurity:   . Worried About Charity fundraiser in the Last Year: Not on file  . Ran Out of Food in the Last Year: Not on file  Transportation Needs:   . Lack of Transportation (Medical): Not on file  . Lack of Transportation (Non-Medical): Not on file  Physical Activity:   . Days of Exercise per Week: Not on file  . Minutes of Exercise per Session: Not on file  Stress:   . Feeling of Stress : Not on file  Social Connections:   . Frequency of Communication with Friends and Family: Not on file  . Frequency of Social Gatherings with Friends and Family: Not on file  . Attends Religious Services: Not on file  . Active Member of Clubs or Organizations: Not on file  . Attends English as a second language teacher Meetings: Not on file  . Marital Status: Not on file  Intimate Partner Violence:   . Fear of Current or Ex-Partner: Not on file  . Emotionally Abused: Not on file  . Physically Abused: Not on file  . Sexually Abused: Not on file     ROS- All systems are reviewed and negative except as per the HPI above.  Physical Exam: Vitals:   11/18/19 1409  BP: (!) 150/70  Pulse: 90  Weight: 126.1 kg  Height: 6\' 2"  (1.88 m)    GEN- The patient is well appearing obese elderly male, alert and oriented x 3 today.   Head- normocephalic, atraumatic Eyes-  Sclera clear, conjunctiva pink Ears- hearing intact Oropharynx- clear Neck- supple  Lungs- Clear to ausculation bilaterally, normal work of breathing Heart- irregular rate and rhythm, no murmurs, rubs or gallops  GI- soft, NT, ND, + BS Extremities- no clubbing, cyanosis, or edema MS- no significant deformity or atrophy Skin- no rash or lesion Psych- euthymic mood, full affect Neuro- strength and sensation are intact  Wt Readings from Last 3 Encounters:  11/18/19 126.1 kg  04/09/19 127.7 kg  01/28/19 127 kg    EKG today demonstrates typical atrial flutter with variable conduction HR 90, LAFB, QRS 82, QTc 484  Echo 02/11/19 demonstrated  1. Left ventricular ejection fraction, by visual estimation, is 55 to  60%. The left ventricle has normal function. There is mildly increased  left ventricular hypertrophy.  2. Left ventricular diastolic parameters are consistent with Grade I  diastolic dysfunction (impaired relaxation).  3. The left ventricle has no regional wall motion abnormalities.  4. Global right ventricle has normal systolic function.The right  ventricular size is normal. No increase in right ventricular wall  thickness.  5. Left atrial size was normal.  6. Right atrial size was normal.  7. The mitral valve is normal in structure. No evidence of mitral valve  regurgitation.  8. The tricuspid valve  is normal in structure. Tricuspid valve  regurgitation is not demonstrated.  9. The aortic valve is tricuspid. Aortic valve regurgitation is not  visualized. No evidence of aortic valve sclerosis or stenosis.  10. 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  11. The inferior vena cava is normal in size with greater than 50%  respiratory variability, suggesting right atrial pressure of 3 mmHg.  12. TR signal is inadequate for assessing pulmonary artery systolic  pressure.   Epic records are reviewed at length today  CHA2DS2-VASc Score = 3  The patient's score is based upon: CHF History: 0 HTN History: 1 Age : 2 Diabetes History: 0 Stroke History: 0 Vascular Disease History: 0 Gender: 0      ASSESSMENT AND PLAN: 1. Typical atrial flutter The patient's CHA2DS2-VASc score is 3, indicating a 3.2% annual risk of stroke.   General education about atrial flutter provided and questions answered. We also discussed his stroke risk and the risks and benefits of anticoagulation. Will plan to start Eliquis 5 mg BID We discussed therapeutic options including rate control, AAD, DCCV, and ablation. Patient would like to be considered for ablation. This would postpone his shoulder surgery as we would not be able to stop anticoagulation for at least 4 weeks post ablation. Patient in agreement with plan. Will refer to EP.  2. Secondary Hypercoagulable State (ICD10:  D68.69) The patient is at significant risk for stroke/thromboembolism based upon his CHA2DS2-VASc Score of 3.  Start Apixaban (Eliquis).   3. Obesity Body mass index is 35.69 kg/m. Lifestyle modification was discussed at length including regular exercise and weight reduction.  4. Obstructive sleep apnea Borderline. CPAP was not recommended.   5. HTN Stable, no changes today.   Follow up with EP to consider ablation.    [facility] [address] [place], [place]  [id number] [phone] 11/18/2019 3:24 PM

## 2019-11-18 NOTE — Patient Instructions (Signed)
Start Eliquis 5mg  twice a day   Scheduling will contact you to set up appointment with Dr. Quentin Ore

## 2019-11-24 NOTE — Progress Notes (Signed)
Electrophysiology Office Note:    Date:  11/25/2019   ID:  Noah Huffman, DOB 11-19-1940, MRN [id number]  PCP:  [name], MD  [facility] Cardiologist:  No primary care provider on file.  [facility] Electrophysiologist:  Vickie Epley, MD   Referring MD: Oliver Barre, PA   Chief Complaint: Atrial flutter  History of Present Illness:    Noah Huffman is a 79 y.o. male with a hx of hypertension, obstructive sleep apnea, COPD, thoracic aortic aneurysm presents to the clinic for an an opinion regarding a new diagnosis of atrial flutter at the request of Noah Peals, PA-C.  He was diagnosed with atrial flutter on November 18, 2019 at his primary care physician's office after presenting there for a preoperative evaluation for a shoulder surgery.  He reports not using CPAP for his mild OSA.  At his appointment in the A. fib clinic on August 31, he was noted to be in typical atrial flutter. He feels more winded than usual with a decreased exercise capacity over the past year. He doesn't feel palpitations. No problem with the anticoagulant.  Past Medical History:  Diagnosis Date   Allergy    seasonal   Asthma    BPH (benign prostatic hypertrophy) with urinary obstruction    Cataract    removed both eyes   Diverticulosis    GERD (gastroesophageal reflux disease)    Hyperglycemia    Hyperlipidemia    Hypertension    Inguinal hernia    Neuromuscular disorder (HCC)    neuropathy in feet    Obesity    OSA (obstructive sleep apnea) 06/06/2013   Osteoarthritis    Sleep apnea    no cpap    Past Surgical History:  Procedure Laterality Date   APPENDECTOMY     arthroscopic knee surgery     CATARACT EXTRACTION  2012   bilateral   COLONOSCOPY     HAMMER TOE SURGERY     INGUINAL HERNIA REPAIR     right   POLYPECTOMY     TONSILLECTOMY AND ADENOIDECTOMY     TOTAL HIP ARTHROPLASTY  2007   right    Current Medications: Current Meds  Medication  Sig   albuterol (VENTOLIN HFA) 108 (90 Base) MCG/ACT inhaler INHALE 2 PUFFS PO Q 4 H PRN   apixaban (ELIQUIS) 5 MG TABS tablet Take 1 tablet (5 mg total) by mouth 2 (two) times daily.   budesonide-formoterol (SYMBICORT) 160-4.5 MCG/ACT inhaler Inhale 2 puffs into the lungs 2 (two) times a day.   cetirizine (ZYRTEC) 10 MG tablet TK 1 T PO QD PRN   gabapentin (NEURONTIN) 600 MG tablet TK 2 TS PO TID   HYDROcodone-acetaminophen (NORCO/VICODIN) 5-325 MG tablet Take 1 tablet by mouth every 6 (six) hours.   losartan-hydrochlorothiazide (HYZAAR) 100-25 MG tablet Take 1 tablet by mouth daily.   Multiple Vitamins-Minerals (PRESERVISION AREDS 2) CAPS Take by mouth.   rosuvastatin (CRESTOR) 10 MG tablet    Tiotropium Bromide Monohydrate (SPIRIVA RESPIMAT) 2.5 MCG/ACT AERS Inhale 2 puffs into the lungs daily.   Current Facility-Administered Medications for the 11/25/19 encounter (Office Visit) with Vickie Epley, MD  Medication   0.9 %  sodium chloride infusion     Allergies:   Morphine and related   Social History   Socioeconomic History   Marital status: Married    Spouse name: Not on file   Number of children: 2   Years of education: Not on file   Highest  education level: Not on file  Occupational History   Occupation: retired  Tobacco Use   Smoking status: Former Smoker    Packs/day: 2.50    Years: 25.00    Pack years: 62.50    Types: Cigarettes    Quit date: 03/20/1988    Years since quitting: 31.7   Smokeless tobacco: Never Used  Substance and Sexual Activity   Alcohol use: Yes    Alcohol/week: 1.0 standard drink    Types: 1 Shots of liquor per week    Comment: 1-2 drinks per day or less    Drug use: No   Sexual activity: Not on file  Other Topics Concern   Not on file  Social History Narrative   Not on file   Social Determinants of Health   Financial Resource Strain:    Difficulty of Paying Living Expenses: Not on file  Food Insecurity:     Worried About Noah Huffman in the Last Year: Not on file   Ran Out of Food in the Last Year: Not on file  Transportation Needs:    Lack of Transportation (Medical): Not on file   Lack of Transportation (Non-Medical): Not on file  Physical Activity:    Days of Exercise per Week: Not on file   Minutes of Exercise per Session: Not on file  Stress:    Feeling of Stress : Not on file  Social Connections:    Frequency of Communication with Friends and Family: Not on file   Frequency of Social Gatherings with Friends and Family: Not on file   Attends Religious Services: Not on file   Active Member of Clubs or Organizations: Not on file   Attends Archivist Meetings: Not on file   Marital Status: Not on file     Family History: The patient's family history includes Brain cancer in his brother; Colon cancer (age of onset: 83) in his mother; Leukemia in his father; Pneumonia in his father; Pulmonary fibrosis in his brother; Transient ischemic attack in his father. There is no history of Colon polyps, Esophageal cancer, Rectal cancer, or Stomach cancer.  ROS:   Please see the history of present illness.    All other systems reviewed and are negative.  EKGs/Labs/Other Studies Reviewed:    The following studies were reviewed today: Echo, ecg  02/11/2019 Echo . Left ventricular ejection fraction, by visual estimation, is 55 to  60%. The left ventricle has normal function. There is mildly increased  left ventricular hypertrophy.  2. Left ventricular diastolic parameters are consistent with Grade I  diastolic dysfunction (impaired relaxation).  3. The left ventricle has no regional wall motion abnormalities.  4. Global right ventricle has normal systolic function.The right  ventricular size is normal. No increase in right ventricular wall  thickness.  5. Left atrial size was normal.  6. Right atrial size was normal.  7. The mitral valve is normal in  structure. No evidence of mitral valve  regurgitation.  8. The tricuspid valve is normal in structure. Tricuspid valve  regurgitation is not demonstrated.  9. The aortic valve is tricuspid. Aortic valve regurgitation is not  visualized. No evidence of aortic valve sclerosis or stenosis.  10. 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  11. The inferior vena cava is normal in size with greater than 50%  respiratory variability, suggesting right atrial pressure of 3 mmHg.  12. TR signal is inadequate for assessing pulmonary artery systolic  pressure.  11/18/2019 ECG shows typical appearing atrial flutter with a ventricular rate of 90 bpm.  June 01, 2015 ECG shows sinus bradycardia  EKG:  The ekg ordered today demonstrates atrial flutter.  Recent Labs: No results found for requested labs within last 8760 hours.  Recent Lipid Panel No results found for: CHOL, TRIG, HDL, CHOLHDL, VLDL, LDLCALC, LDLDIRECT  Physical Exam:    VS:  BP 110/64    Pulse 92    Ht 6\' 3"  (1.905 m)    Wt 279 lb (126.6 kg)    SpO2 96%    BMI 34.87 kg/m     Wt Readings from Last 3 Encounters:  11/25/19 279 lb (126.6 kg)  11/18/19 278 lb (126.1 kg)  04/09/19 281 lb 9.6 oz (127.7 kg)     GEN: Well nourished, well developed in no acute distress HEENT: Normal NECK: No JVD; No carotid bruits LYMPHATICS: No lymphadenopathy CARDIAC: RRR (AFL), no murmurs, rubs, gallops RESPIRATORY:  Clear to auscultation without rales, wheezing or rhonchi  ABDOMEN: Soft, non-tender, non-distended MUSCULOSKELETAL:  No edema; No deformity  SKIN: Warm and dry NEUROLOGIC:  Alert and oriented x 3 PSYCHIATRIC:  Normal affect   ASSESSMENT:    1. Typical atrial flutter ([place])   2. Essential hypertension   3. OSA (obstructive sleep apnea)   4. Chronic obstructive pulmonary disease, unspecified COPD type ([place])    PLAN:    In order of problems listed above:  1. Typical atrial flutter CHA2DS2-VASc of 3 for age  and hypertension.  Discussed management options including pharmacologic therapy, cardioversion, ablation in the patient wishes to proceed with ablation.  To confirm no concomitant atrial fibrillation, will order a 2-week ZIO monitor as this would change the ablation strategy.   Risk, benefits, and alternatives to EP study and radiofrequency ablation for afib/flutter were also discussed in detail today. These risks include but are not limited to stroke, bleeding, vascular damage, tamponade, perforation, damage to the esophagus, lungs, and other structures, pulmonary vein stenosis, worsening renal function, and death. The patient understands these risk and wishes to proceed.  We will therefore proceed with catheter ablation at the next available time.  Carto, ICE, anesthesia are requested for the procedure.    2. HTN Controlled on today's visit. Continue Hyzaar  3. OSA  4. COPD At baseline today. Continue symbicort and spiriva.    Medication Adjustments/Labs and Tests Ordered: Current medicines are reviewed at length with the patient today.  Concerns regarding medicines are outlined above.  Orders Placed This Encounter  Procedures   LONG TERM MONITOR (3-14 DAYS)   EKG 12-Lead   No orders of the defined types were placed in this encounter.   Patient Instructions  Medication Instructions:  Your physician recommends that you continue on your current medications as directed. Please refer to the Current Medication list given to you today.  Labwork: None ordered.  Testing/Procedures: Your physician has recommended that you wear a holter monitor. Holter monitors are medical devices that record the hearts electrical activity. Doctors most often use these monitors to diagnose arrhythmias.   You will wear a ZIO heart monitor for 14 days.   Follow-Up:  Procedure day will be January 02, 2020   Any Other Special Instructions Will Be Listed Below (If Applicable).  If you need a refill  on your cardiac medications before your next appointment, please call your pharmacy.   ZIO XT- Long Term Monitor Instructions   Your physician has requested you wear your ZIO patch monitor 14  days.   This is a single patch monitor.  [employer] supplies one patch monitor per enrollment.  Additional stickers are not available.   Please do not apply patch if you will be having a Nuclear Stress Test, Echocardiogram, Cardiac CT, MRI, or Chest Xray during the time frame you would be wearing the monitor. The patch cannot be worn during these tests.  You cannot remove and re-apply the ZIO XT patch monitor.   Your ZIO patch monitor will be sent USPS Priority mail from [facility] directly to your home address. The monitor may also be mailed to a PO BOX if home delivery is not available.   It may take 3-5 days to receive your monitor after you have been enrolled.   Once you have received you monitor, please review enclosed instructions.  Your monitor has already been registered assigning a specific monitor serial # to you.   Applying the monitor   Shave hair from upper left chest.   Hold abrader disc by orange tab.  Rub abrader in 40 strokes over left upper chest as indicated in your monitor instructions.   Clean area with 4 enclosed alcohol pads .  Use all pads to assure are is cleaned thoroughly.  Let dry.   Apply patch as indicated in monitor instructions.  Patch will be place under collarbone on left side of chest with arrow pointing upward.   Rub patch adhesive wings for 2 minutes.Remove white label marked "1".  Remove white label marked "2".  Rub patch adhesive wings for 2 additional minutes.   While looking in a mirror, press and release button in center of patch.  A small green light will flash 3-4 times .  This will be your only indicator the monitor has been turned on.     Do not shower for the first 24 hours.  You may shower after the first 24 hours.   Press button if you feel a  symptom. You will hear a small click.  Record Date, Time and Symptom in the Patient Log Book.   When you are ready to remove patch, follow instructions on last 2 pages of Patient Log Book.  Stick patch monitor onto last page of Patient Log Book.   Place Patient Log Book in [place] box.  Use locking tab on box and tape box closed securely.  The [employer] has [employer] on it.  Please place in mailbox as soon as possible.  Your physician should have your test results approximately 7 days after the monitor has been mailed back to [facility].   Call [place] at [phone] if you have questions regarding your ZIO XT patch monitor.  Call them immediately if you see an orange light blinking on your monitor.   If your monitor falls off in less than 4 days contact our Monitor department at [phone].  If your monitor becomes loose or falls off after 4 days call [employer] at [phone] for suggestions on securing your monitor.       Signed, Lars Mage, MD, [facility]  11/25/2019 3:17 PM    Electrophysiology [facility]   Anticoagulation instructions: Pt to hold Eliquis for 1 dose(s) prior to procedure and we will plan to resume the day of the procedure unless otherwise instructed after surgery.  Medication instructions morning of: The patient should take their medications the morning of the procedure, except for any diuretic (lasix, torsemide, or bumex).   Discharge: Our plan will  be to discharge the patient same day after a period of observation

## 2019-11-25 ENCOUNTER — Encounter: Payer: Self-pay | Admitting: Cardiology

## 2019-11-25 ENCOUNTER — Telehealth: Payer: Self-pay | Admitting: Radiology

## 2019-11-25 ENCOUNTER — Ambulatory Visit: Payer: PPO | Admitting: Cardiology

## 2019-11-25 ENCOUNTER — Other Ambulatory Visit: Payer: Self-pay

## 2019-11-25 VITALS — BP 110/64 | HR 92 | Ht 75.0 in | Wt 279.0 lb

## 2019-11-25 DIAGNOSIS — J449 Chronic obstructive pulmonary disease, unspecified: Secondary | ICD-10-CM

## 2019-11-25 DIAGNOSIS — I483 Typical atrial flutter: Secondary | ICD-10-CM

## 2019-11-25 DIAGNOSIS — G4733 Obstructive sleep apnea (adult) (pediatric): Secondary | ICD-10-CM

## 2019-11-25 DIAGNOSIS — I10 Essential (primary) hypertension: Secondary | ICD-10-CM

## 2019-11-25 NOTE — Patient Instructions (Addendum)
Medication Instructions:  Your physician recommends that you continue on your current medications as directed. Please refer to the Current Medication list given to you today.  Labwork: None ordered.  Testing/Procedures: Your physician has recommended that you wear a holter monitor. Holter monitors are medical devices that record the heart's electrical activity. Doctors most often use these monitors to diagnose arrhythmias.   You will wear a ZIO heart monitor for 14 days.   Follow-Up:  Procedure day will be January 02, 2020   Any Other Special Instructions Will Be Listed Below (If Applicable).  If you need a refill on your cardiac medications before your next appointment, please call your pharmacy.   ZIO XT- Long Term Monitor Instructions   Your physician has requested you wear your ZIO patch monitor 14 days.   This is a single patch monitor.  [employer] supplies one patch monitor per enrollment.  Additional stickers are not available.   Please do not apply patch if you will be having a Nuclear Stress Test, Echocardiogram, Cardiac CT, MRI, or Chest Xray during the time frame you would be wearing the monitor. The patch cannot be worn during these tests.  You cannot remove and re-apply the ZIO XT patch monitor.   Your ZIO patch monitor will be sent USPS Priority mail from [employer] directly to your home address. The monitor may also be mailed to a PO BOX if home delivery is not available.   It may take 3-5 days to receive your monitor after you have been enrolled.   Once you have received you monitor, please review enclosed instructions.  Your monitor has already been registered assigning a specific monitor serial # to you.   Applying the monitor   Shave hair from upper left chest.   Hold abrader disc by orange tab.  Rub abrader in 40 strokes over left upper chest as indicated in your monitor instructions.   Clean area with 4 enclosed alcohol pads .  Use all pads to assure  are is cleaned thoroughly.  Let dry.   Apply patch as indicated in monitor instructions.  Patch will be place under collarbone on left side of chest with arrow pointing upward.   Rub patch adhesive wings for 2 minutes.Remove white label marked "1".  Remove white label marked "2".  Rub patch adhesive wings for 2 additional minutes.   While looking in a mirror, press and release button in center of patch.  A small green light will flash 3-4 times .  This will be your only indicator the monitor has been turned on.     Do not shower for the first 24 hours.  You may shower after the first 24 hours.   Press button if you feel a symptom. You will hear a small click.  Record Date, Time and Symptom in the Patient Log Book.   When you are ready to remove patch, follow instructions on last 2 pages of Patient Log Book.  Stick patch monitor onto last page of Patient Log Book.   Place Patient Log Book in [place] box.  Use locking tab on box and tape box closed securely.  The [employer] has [employer] on it.  Please place in mailbox as soon as possible.  Your physician should have your test results approximately 7 days after the monitor has been mailed back to [facility].   Call [facility] at [phone] if you have questions regarding your ZIO XT patch monitor.  Call  them immediately if you see an orange light blinking on your monitor.   If your monitor falls off in less than 4 days contact our Monitor department at [phone].  If your monitor becomes loose or falls off after 4 days call [employer] at [phone] for suggestions on securing your monitor.

## 2019-11-25 NOTE — Telephone Encounter (Signed)
Enrolled patient for a 14 day Zio XT  monitor to be mailed to patients home  °

## 2019-11-29 ENCOUNTER — Other Ambulatory Visit (INDEPENDENT_AMBULATORY_CARE_PROVIDER_SITE_OTHER): Payer: PPO

## 2019-11-29 DIAGNOSIS — I483 Typical atrial flutter: Secondary | ICD-10-CM | POA: Diagnosis not present

## 2019-11-30 ENCOUNTER — Other Ambulatory Visit: Payer: Self-pay

## 2019-11-30 ENCOUNTER — Emergency Department (HOSPITAL_BASED_OUTPATIENT_CLINIC_OR_DEPARTMENT_OTHER)
Admission: EM | Admit: 2019-11-30 | Discharge: 2019-11-30 | Disposition: A | Discharge status: Home / Self Care | Payer: PPO | Attending: Emergency Medicine | Admitting: Emergency Medicine

## 2019-11-30 ENCOUNTER — Emergency Department (HOSPITAL_BASED_OUTPATIENT_CLINIC_OR_DEPARTMENT_OTHER): Payer: PPO

## 2019-11-30 ENCOUNTER — Encounter (HOSPITAL_BASED_OUTPATIENT_CLINIC_OR_DEPARTMENT_OTHER): Payer: Self-pay | Admitting: Emergency Medicine

## 2019-11-30 DIAGNOSIS — J9811 Atelectasis: Secondary | ICD-10-CM | POA: Diagnosis not present

## 2019-11-30 DIAGNOSIS — Z7901 Long term (current) use of anticoagulants: Secondary | ICD-10-CM | POA: Diagnosis not present

## 2019-11-30 DIAGNOSIS — Z79899 Other long term (current) drug therapy: Secondary | ICD-10-CM | POA: Insufficient documentation

## 2019-11-30 DIAGNOSIS — R0602 Shortness of breath: Secondary | ICD-10-CM | POA: Diagnosis not present

## 2019-11-30 DIAGNOSIS — I10 Essential (primary) hypertension: Secondary | ICD-10-CM | POA: Diagnosis not present

## 2019-11-30 DIAGNOSIS — A419 Sepsis, unspecified organism: Secondary | ICD-10-CM | POA: Diagnosis not present

## 2019-11-30 DIAGNOSIS — N3001 Acute cystitis with hematuria: Secondary | ICD-10-CM | POA: Diagnosis not present

## 2019-11-30 DIAGNOSIS — Z87891 Personal history of nicotine dependence: Secondary | ICD-10-CM | POA: Insufficient documentation

## 2019-11-30 DIAGNOSIS — Z7951 Long term (current) use of inhaled steroids: Secondary | ICD-10-CM | POA: Insufficient documentation

## 2019-11-30 DIAGNOSIS — J45909 Unspecified asthma, uncomplicated: Secondary | ICD-10-CM | POA: Diagnosis not present

## 2019-11-30 DIAGNOSIS — R509 Fever, unspecified: Secondary | ICD-10-CM | POA: Diagnosis present

## 2019-11-30 DIAGNOSIS — Z96641 Presence of right artificial hip joint: Secondary | ICD-10-CM | POA: Diagnosis not present

## 2019-11-30 DIAGNOSIS — R000 Tachycardia, unspecified: Secondary | ICD-10-CM | POA: Insufficient documentation

## 2019-11-30 DIAGNOSIS — I4891 Unspecified atrial fibrillation: Secondary | ICD-10-CM | POA: Diagnosis not present

## 2019-11-30 HISTORY — DX: Unspecified atrial fibrillation: I48.91

## 2019-11-30 LAB — URINALYSIS, MICROSCOPIC (REFLEX): RBC / HPF: >50 RBC/hpf (ref 0–5)

## 2019-11-30 LAB — CBC WITH DIFFERENTIAL/PLATELET
Abs Immature Granulocytes: 0.06 10*3/uL (ref 0.00–0.07)
Basophils Absolute: 0.1 10*3/uL (ref 0.0–0.1)
Basophils Relative: 0 %
Eosinophils Absolute: 0.1 10*3/uL (ref 0.0–0.5)
Eosinophils Relative: 1 %
HCT: 41.4 % (ref 39.0–52.0)
Hemoglobin: 14 g/dL (ref 13.0–17.0)
Immature Granulocytes: 1 %
Lymphocytes Relative: 5 %
Lymphs Abs: 0.6 10*3/uL — ABNORMAL LOW (ref 0.7–4.0)
MCH: 30.8 pg (ref 26.0–34.0)
MCHC: 33.8 g/dL (ref 30.0–36.0)
MCV: 91 fL (ref 80.0–100.0)
Monocytes Absolute: 0.5 10*3/uL (ref 0.1–1.0)
Monocytes Relative: 4 %
Neutro Abs: 11.9 10*3/uL — ABNORMAL HIGH (ref 1.7–7.7)
Neutrophils Relative %: 89 %
Platelets: 183 10*3/uL (ref 150–400)
RBC: 4.55 MIL/uL (ref 4.22–5.81)
RDW: 13.3 % (ref 11.5–15.5)
WBC: 13.2 10*3/uL — ABNORMAL HIGH (ref 4.0–10.5)
nRBC: 0 % (ref 0.0–0.2)

## 2019-11-30 LAB — URINALYSIS, ROUTINE W REFLEX MICROSCOPIC
Glucose, UA: NEGATIVE mg/dL
Ketones, ur: NEGATIVE mg/dL
Nitrite: POSITIVE — AB
Protein, ur: NEGATIVE mg/dL
Specific Gravity, Urine: 1.025 (ref 1.005–1.030)
pH: 5.5 (ref 5.0–8.0)

## 2019-11-30 LAB — COMPREHENSIVE METABOLIC PANEL
ALT: 15 U/L (ref 0–44)
AST: 24 U/L (ref 15–41)
Albumin: 4.2 g/dL (ref 3.5–5.0)
Alkaline Phosphatase: 48 U/L (ref 38–126)
Anion gap: 10 (ref 5–15)
BUN: 23 mg/dL (ref 8–23)
CO2: 25 mmol/L (ref 22–32)
Calcium: 8.7 mg/dL — ABNORMAL LOW (ref 8.9–10.3)
Chloride: 105 mmol/L (ref 98–111)
Creatinine, Ser: 0.8 mg/dL (ref 0.61–1.24)
GFR calc Af Amer: >60 mL/min (ref >60)
GFR calc non Af Amer: >60 mL/min (ref >60)
Glucose, Bld: 165 mg/dL — ABNORMAL HIGH (ref 70–99)
Potassium: 3.2 mmol/L — ABNORMAL LOW (ref 3.5–5.1)
Sodium: 140 mmol/L (ref 135–145)
Total Bilirubin: 1.5 mg/dL — ABNORMAL HIGH (ref 0.3–1.2)
Total Protein: 6.7 g/dL (ref 6.5–8.1)

## 2019-11-30 LAB — PROTIME-INR
INR: 1.2 (ref 0.8–1.2)
Prothrombin Time: 14.4 seconds (ref 11.4–15.2)

## 2019-11-30 LAB — APTT: aPTT: 33 seconds (ref 24–36)

## 2019-11-30 LAB — LACTIC ACID, PLASMA
Lactic Acid, Venous: 1.7 mmol/L (ref 0.5–1.9)
Lactic Acid, Venous: 1.8 mmol/L (ref 0.5–1.9)

## 2019-11-30 MED ORDER — POTASSIUM CHLORIDE CRYS ER 20 MEQ PO TBCR
40.0000 meq | EXTENDED_RELEASE_TABLET | Freq: Once | ORAL | Status: AC
  Administered 2019-11-30: 40 meq via ORAL
  Filled 2019-11-30: qty 2

## 2019-11-30 MED ORDER — LACTATED RINGERS IV BOLUS (SEPSIS)
1000.0000 mL | Freq: Once | INTRAVENOUS | Status: AC
  Administered 2019-11-30: 1000 mL via INTRAVENOUS

## 2019-11-30 MED ORDER — CEPHALEXIN 250 MG PO CAPS
1000.0000 mg | ORAL_CAPSULE | Freq: Once | ORAL | Status: AC
  Administered 2019-11-30: 1000 mg via ORAL
  Filled 2019-11-30: qty 4

## 2019-11-30 MED ORDER — KETOROLAC TROMETHAMINE 60 MG/2ML IM SOLN
15.0000 mg | Freq: Once | INTRAMUSCULAR | Status: AC
  Administered 2019-11-30: 15 mg via INTRAMUSCULAR
  Filled 2019-11-30: qty 2

## 2019-11-30 MED ORDER — CEPHALEXIN 500 MG PO CAPS: 500.0000 mg | ORAL_CAPSULE | Freq: Four times a day (QID) | ORAL | 0 refills | Status: DC

## 2019-11-30 MED ORDER — SODIUM CHLORIDE 0.9 % IV SOLN: 1.0000 g | Freq: Once | INTRAVENOUS | Status: DC

## 2019-11-30 NOTE — ED Notes (Signed)
Multiple bladder scans 15ml, 45ml, 81ml. Bladder appearance on scanner shows little void space.

## 2019-11-30 NOTE — ED Triage Notes (Addendum)
Chills, SOB and urinary frequency since early morning.

## 2019-11-30 NOTE — ED Notes (Signed)
ED Provider at bedside. 

## 2019-11-30 NOTE — Discharge Instructions (Signed)
Return for inability to eat or drink or if you are doing poorly at home in 1 to come to the hospital.

## 2019-11-30 NOTE — ED Provider Notes (Signed)
[place] EMERGENCY DEPARTMENT Provider Note   CSN: [id number] Arrival date & time: 11/30/19  9518     History Chief Complaint  Patient presents with  . Chills  . Urinary Frequency  . Shortness of Breath    Noah Huffman is a 79 y.o. male.  79 yo M with a chief complaints of fevers and chills.  This is been going on for since last night.  He has a chronic cough but thinks that that also worsened last night.  Has had headaches for the past couple days.  Has been constipated denies diarrhea.  Denies abdominal pain.  Has been having increased urinary frequency.  Has a history of a urinary tract infection post urinary catheter placement when he had a procedure done.  Thinks that his symptoms are somewhat similar to that.  Has been recently diagnosed with A. fib/flutter and has had multiple visits to the cardiologist.  Currently has a Zio patch in place with EP study planned.  Denies sick contacts.  The history is provided by the patient.  Urinary Frequency This is a new problem. The current episode started yesterday. The problem occurs constantly. The problem has not changed since onset.Associated symptoms include shortness of breath. Pertinent negatives include no chest pain, no abdominal pain and no headaches. Nothing aggravates the symptoms. Nothing relieves the symptoms. He has tried nothing for the symptoms. The treatment provided no relief.  Shortness of Breath Associated symptoms: no abdominal pain, no chest pain, no fever, no headaches, no rash and no vomiting        Past Medical History:  Diagnosis Date  . Allergy    seasonal  . Asthma   . Atrial fibrillation ([place])   . BPH (benign prostatic hypertrophy) with urinary obstruction   . Cataract    removed both eyes  . Diverticulosis   . GERD (gastroesophageal reflux disease)   . Hyperglycemia   . Hyperlipidemia   . Hypertension   . Inguinal hernia   . Neuromuscular disorder ([facility])    neuropathy in feet   .  Obesity   . OSA (obstructive sleep apnea) 06/06/2013  . Osteoarthritis   . Sleep apnea    no cpap    Patient Active Problem List   Diagnosis Date Noted  . 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 Secondary hypercoagulable state ([place]) 11/18/2019  . OSA (obstructive sleep apnea) 06/06/2013  . 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 HBP (high blood pressure) 10/03/2012  . Cough 10/02/2012    Past Surgical History:  Procedure Laterality Date  . APPENDECTOMY    . arthroscopic knee surgery    . CATARACT EXTRACTION  2012   bilateral  . COLONOSCOPY    . HAMMER TOE SURGERY    . INGUINAL HERNIA REPAIR     right  . POLYPECTOMY    . TONSILLECTOMY AND ADENOIDECTOMY    . TOTAL HIP ARTHROPLASTY  2007   right       Family History  Problem Relation Age of Onset  . Leukemia Father   . Transient ischemic attack Father   . Pneumonia Father   . Colon cancer Mother 90  . Brain cancer Brother   . Pulmonary fibrosis Brother   . Colon polyps Neg Hx   . Esophageal cancer Neg Hx   . Rectal cancer Neg Hx   . Stomach cancer Neg Hx     Social History   Tobacco Use  . Smoking status: Former Smoker    Packs/day:  2.50    Years: 25.00    Pack years: 62.50    Types: Cigarettes    Quit date: 03/20/1988    Years since quitting: 31.7  . Smokeless tobacco: Never Used  Substance Use Topics  . Alcohol use: Yes    Alcohol/week: 1.0 standard drink    Types: 1 Shots of liquor per week    Comment: 1-2 drinks per day or less   . Drug use: No    Home Medications Prior to Admission medications   Medication Sig Start Date End Date Taking? Authorizing Provider  albuterol (VENTOLIN HFA) 108 (90 Base) MCG/ACT inhaler INHALE 2 PUFFS PO Q 4 H PRN 12/16/18   [provider]  apixaban (ELIQUIS) 5 MG TABS tablet Take 1 tablet (5 mg total) by mouth 2 (two) times daily. 11/18/19   Fenton, Clint R, PA  budesonide-formoterol (SYMBICORT) 160-4.5 MCG/ACT inhaler Inhale 2 puffs into the lungs 2 (two) times a day.  09/05/18   Brand Males, MD  cephALEXin (KEFLEX) 500 MG capsule Take 1 capsule (500 mg total) by mouth 4 (four) times daily. 11/30/19   Deno Etienne, DO  cetirizine (ZYRTEC) 10 MG tablet TK 1 T PO QD PRN 08/15/17   [provider]  gabapentin (NEURONTIN) 600 MG tablet TK 2 TS PO TID 08/08/17   [provider]  HYDROcodone-acetaminophen (NORCO/VICODIN) 5-325 MG tablet Take 1 tablet by mouth every 6 (six) hours. 11/11/19   [provider]  losartan-hydrochlorothiazide (HYZAAR) 100-25 MG tablet Take 1 tablet by mouth daily. 04/12/15   [provider]  Multiple Vitamins-Minerals (PRESERVISION AREDS 2) CAPS Take by mouth.    [provider]  rosuvastatin (CRESTOR) 10 MG tablet  08/24/17   [provider]  Tiotropium Bromide Monohydrate (SPIRIVA RESPIMAT) 2.5 MCG/ACT AERS Inhale 2 puffs into the lungs daily. 05/22/18   Brand Males, MD    Allergies    Morphine and related  Review of Systems   Review of Systems  Constitutional: Negative for chills and fever.  HENT: Negative for congestion and facial swelling.   Eyes: Negative for discharge and visual disturbance.  Respiratory: Positive for shortness of breath.   Cardiovascular: Negative for chest pain and palpitations.  Gastrointestinal: Negative for abdominal pain, diarrhea and vomiting.  Genitourinary: Positive for frequency.  Musculoskeletal: Negative for arthralgias and myalgias.  Skin: Negative for color change and rash.  Neurological: Negative for tremors, syncope and headaches.  Psychiatric/Behavioral: Negative for confusion and dysphoric mood.    Physical Exam Updated Vital Signs BP 115/73 (BP Location: Right Arm)   Pulse 99   Temp 98.7 F (37.1 C) (Oral)   Resp 18   Ht 6\' 4"  (1.93 m)   Wt 127 kg   SpO2 95%   BMI 34.08 kg/m   Physical Exam Vitals and nursing note reviewed.  Constitutional:      Appearance: He is well-developed. He is diaphoretic.  HENT:     Head:  Normocephalic and atraumatic.  Eyes:     Pupils: Pupils are equal, round, and reactive to light.  Neck:     Vascular: No JVD.  Cardiovascular:     Rate and Rhythm: Regular rhythm. Tachycardia present.     Heart sounds: No murmur heard.  No friction rub. No gallop.   Pulmonary:     Effort: No respiratory distress.     Breath sounds: No wheezing.  Abdominal:     General: There is no distension.     Tenderness: There is no guarding  or rebound.  Musculoskeletal:        General: Normal range of motion.     Cervical back: Normal range of motion and neck supple.  Skin:    Coloration: Skin is not pale.     Findings: No rash.  Neurological:     Mental Status: He is alert and oriented to person, place, and time.  Psychiatric:        Behavior: Behavior normal.     ED Results / Procedures / Treatments   Labs (all labs ordered are listed, but only abnormal results are displayed) Labs Reviewed  COMPREHENSIVE METABOLIC PANEL - Abnormal; Notable for the following components:      Result Value   Potassium 3.2 (*)    Glucose, Bld 165 (*)    Calcium 8.7 (*)    Total Bilirubin 1.5 (*)    All other components within normal limits  CBC WITH DIFFERENTIAL/PLATELET - Abnormal; Notable for the following components:   WBC 13.2 (*)    Neutro Abs 11.9 (*)    Lymphs Abs 0.6 (*)    All other components within normal limits  URINALYSIS, ROUTINE W REFLEX MICROSCOPIC - Abnormal; Notable for the following components:   APPearance CLOUDY (*)    Hgb urine dipstick LARGE (*)    Bilirubin Urine SMALL (*)    Nitrite POSITIVE (*)    Leukocytes,Ua TRACE (*)    All other components within normal limits  URINALYSIS, MICROSCOPIC (REFLEX) - Abnormal; Notable for the following components:   Bacteria, UA MANY (*)    All other components within normal limits  CULTURE, BLOOD (ROUTINE X 2)  URINE CULTURE  CULTURE, BLOOD (ROUTINE X 2)  LACTIC ACID, PLASMA  LACTIC ACID, PLASMA  PROTIME-INR  APTT     EKG EKG Interpretation  Date/Time:  Sunday November 30 2019 09:20:36 EDT Ventricular Rate:  123 PR Interval:    QRS Duration: 123 QT Interval:  347 QTC Calculation: 501 R Axis:   -63 Text Interpretation: Atrial flutter RBBB and LAFB Abnormal lateral Q waves No significant change since last tracing Confirmed by Deno Etienne [phone]) on 11/30/2019 9:30:46 AM   Radiology DG Chest Port 1 View  Result Date: 11/30/2019 CLINICAL DATA:  Chills, shortness of breath, and urinary frequency since early morning, atrial fibrillation, question sepsis EXAM: PORTABLE CHEST 1 VIEW COMPARISON:  Portable exam 1000 hours compared to 10/02/2012 FINDINGS: Normal heart size, mediastinal contours, and pulmonary vascularity. Electronic monitoring device projects over LEFT upper lobe. Subsegmental atelectasis LEFT base, mild. Slight chronic accentuation of LEFT infrahilar markings stable. No definite infiltrate, pleural effusion or pneumothorax. IMPRESSION: Chronic accentuation of LEFT basilar markings with mild subsegmental atelectasis Electronically Signed   By: Lavonia Dana M.D.   On: 11/30/2019 10:26    Procedures Procedures (including critical care time)  Medications Ordered in ED Medications  lactated ringers bolus 1,000 mL (0 mLs Intravenous Stopped 11/30/19 1135)  potassium chloride SA (KLOR-CON) CR tablet 40 mEq (40 mEq Oral Given 11/30/19 1035)  ketorolac (TORADOL) injection 15 mg (15 mg Intramuscular Given 11/30/19 1144)  lactated ringers bolus 1,000 mL (0 mLs Intravenous Stopped 11/30/19 1432)  cephALEXin (KEFLEX) capsule 1,000 mg (1,000 mg Oral Given 11/30/19 1431)    ED Course  I have reviewed the triage vital signs and the nursing notes.  Pertinent labs & imaging results that were available during my care of the patient were reviewed by me and considered in my medical decision making (see chart for details).    MDM  Rules/Calculators/A&P                          79 yo M with a chief  complaints of fevers and chills.  Started last night.  Having some urinary symptoms with this.  Will obtain a laboratory evaluation here.  Chest x-ray UA.  Reassess.  UA with infection.  Discussed with family, offered admission, would like to try and go home.  Patient's lactate is normal his white blood cell count is mildly elevated at 13,000.  3:24 PM:  I have discussed the diagnosis/risks/treatment options with the patient and family and believe the pt to be eligible for discharge home to follow-up with PCP. We also discussed returning to the ED immediately if new or worsening sx occur. We discussed the sx which are most concerning (e.g., sudden worsening pain, fever, inability to tolerate by mouth) that necessitate immediate return. Medications administered to the patient during their visit and any new prescriptions provided to the patient are listed below.  Medications given during this visit Medications  lactated ringers bolus 1,000 mL (0 mLs Intravenous Stopped 11/30/19 1135)  potassium chloride SA (KLOR-CON) CR tablet 40 mEq (40 mEq Oral Given 11/30/19 1035)  ketorolac (TORADOL) injection 15 mg (15 mg Intramuscular Given 11/30/19 1144)  lactated ringers bolus 1,000 mL (0 mLs Intravenous Stopped 11/30/19 1432)  cephALEXin (KEFLEX) capsule 1,000 mg (1,000 mg Oral Given 11/30/19 1431)     The patient appears reasonably screen and/or stabilized for discharge and I doubt any other medical condition or other [facility] requiring further screening, evaluation, or treatment in the ED at this time prior to discharge.     Final Clinical Impression(s) / ED Diagnoses Final diagnoses:  Acute cystitis with hematuria    Rx / DC Orders ED Discharge Orders         Ordered    cephALEXin (KEFLEX) 500 MG capsule  4 times daily        11/30/19 [place], Fatimah Sundquist, DO 11/30/19 1524

## 2019-11-30 NOTE — ED Notes (Signed)
Bladder scan done resulted 95 ml

## 2019-12-02 LAB — URINE CULTURE: Culture: >=100000 — AB

## 2019-12-03 ENCOUNTER — Telehealth: Payer: Self-pay | Admitting: *Deleted

## 2019-12-03 ENCOUNTER — Ambulatory Visit: Payer: PPO | Admitting: Internal Medicine

## 2019-12-03 DIAGNOSIS — Z03818 Encounter for observation for suspected exposure to other biological agents ruled out: Secondary | ICD-10-CM | POA: Diagnosis not present

## 2019-12-03 DIAGNOSIS — Z20822 Contact with and (suspected) exposure to covid-19: Secondary | ICD-10-CM | POA: Diagnosis not present

## 2019-12-03 NOTE — Telephone Encounter (Signed)
Post ED Visit - Positive Culture Follow-up  Culture report reviewed by antimicrobial stewardship pharmacist: [place] Team []  Elenor Quinones, Pharm.D. []  Heide Guile, Pharm.D., BCPS AQ-ID []  Parks Neptune, Pharm.D., BCPS []  Alycia Rossetti, Pharm.D., BCPS []  [place], Pharm.D., BCPS, AAHIVP []  Legrand Como, Pharm.D., BCPS, AAHIVP []  Salome Arnt, PharmD, BCPS []  Johnnette Gourd, PharmD, BCPS []  Hughes Better, PharmD, BCPS []  Leeroy Cha, PharmD []  Laqueta Linden, PharmD, BCPS []  Albertina Parr, PharmD  [place] Team []  Leodis Sias, PharmD []  Lindell Spar, PharmD []  Royetta Asal, PharmD []  Graylin Shiver, Rph []  Rema Fendt) Glennon Mac, PharmD []  Arlyn Dunning, PharmD []  Netta Cedars, PharmD []  Dia Sitter, PharmD []  Leone Haven, PharmD []  Gretta Arab, PharmD []  Theodis Shove, PharmD []  Peggyann Juba, PharmD []  Reuel Boom, PharmD   Positive urine culture Treated with Cephalexin, organism sensitive to the same and no further patient follow-up is required at this time. Romilda Garret, PharmD  Harlon Flor Talley 12/03/2019, 3:30 PM

## 2019-12-05 LAB — CULTURE, BLOOD (ROUTINE X 2)
Culture: NO GROWTH
Culture: NO GROWTH
Special Requests: ADEQUATE
Special Requests: ADEQUATE

## 2019-12-17 DIAGNOSIS — I483 Typical atrial flutter: Secondary | ICD-10-CM | POA: Diagnosis not present

## 2019-12-22 ENCOUNTER — Telehealth: Payer: Self-pay

## 2019-12-22 DIAGNOSIS — I483 Typical atrial flutter: Secondary | ICD-10-CM

## 2019-12-22 NOTE — Telephone Encounter (Signed)
Call placed to pt.  Advised he did not have any atrial fibrillation on his heart monitor.  His procedure will be an atrial flutter ablation.  Pt scheduled for lab work and covid test on 12/31/2019.  Pt has instruction letter.  Reiterated instructions.  All questions answered, work up complete.

## 2019-12-24 DIAGNOSIS — I10 Essential (primary) hypertension: Secondary | ICD-10-CM | POA: Diagnosis not present

## 2019-12-26 ENCOUNTER — Other Ambulatory Visit: Payer: Self-pay | Admitting: Internal Medicine

## 2019-12-26 DIAGNOSIS — R3589 Other polyuria: Secondary | ICD-10-CM

## 2019-12-26 DIAGNOSIS — R351 Nocturia: Secondary | ICD-10-CM

## 2019-12-31 ENCOUNTER — Other Ambulatory Visit (HOSPITAL_COMMUNITY)
Admission: RE | Admit: 2019-12-31 | Discharge: 2019-12-31 | Disposition: A | Discharge status: Home / Self Care | Payer: PPO | Source: Ambulatory Visit | Attending: Cardiology | Admitting: Cardiology

## 2019-12-31 ENCOUNTER — Other Ambulatory Visit: Payer: Self-pay

## 2019-12-31 ENCOUNTER — Other Ambulatory Visit: Payer: PPO | Admitting: *Deleted

## 2019-12-31 DIAGNOSIS — I483 Typical atrial flutter: Secondary | ICD-10-CM | POA: Diagnosis not present

## 2019-12-31 DIAGNOSIS — Z01812 Encounter for preprocedural laboratory examination: Secondary | ICD-10-CM | POA: Diagnosis not present

## 2019-12-31 DIAGNOSIS — Z20822 Contact with and (suspected) exposure to covid-19: Secondary | ICD-10-CM | POA: Diagnosis not present

## 2019-12-31 LAB — CBC WITH DIFFERENTIAL/PLATELET
Basophils Absolute: 0 10*3/uL (ref 0.0–0.2)
Basos: 0 %
EOS (ABSOLUTE): 0.4 10*3/uL (ref 0.0–0.4)
Eos: 5 %
Hematocrit: 41.9 % (ref 37.5–51.0)
Hemoglobin: 14.1 g/dL (ref 13.0–17.7)
Lymphocytes Absolute: 2.3 10*3/uL (ref 0.7–3.1)
Lymphs: 28 %
MCH: 30.5 pg (ref 26.6–33.0)
MCHC: 33.7 g/dL (ref 31.5–35.7)
MCV: 91 fL (ref 79–97)
Monocytes Absolute: 0.7 10*3/uL (ref 0.1–0.9)
Monocytes: 9 %
Neutrophils Absolute: 4.8 10*3/uL (ref 1.4–7.0)
Neutrophils: 58 %
Platelets: 204 10*3/uL (ref 150–450)
RBC: 4.62 x10E6/uL (ref 4.14–5.80)
RDW: 14.6 % (ref 11.6–15.4)
WBC: 8.3 10*3/uL (ref 3.4–10.8)

## 2019-12-31 LAB — BASIC METABOLIC PANEL
BUN/Creatinine Ratio: 28 — ABNORMAL HIGH (ref 10–24)
BUN: 21 mg/dL (ref 8–27)
CO2: 31 mmol/L — ABNORMAL HIGH (ref 20–29)
Calcium: 9.1 mg/dL (ref 8.6–10.2)
Chloride: 102 mmol/L (ref 96–106)
Creatinine, Ser: 0.75 mg/dL — ABNORMAL LOW (ref 0.76–1.27)
GFR calc Af Amer: 102 mL/min/{1.73_m2} (ref >59)
GFR calc non Af Amer: 88 mL/min/{1.73_m2} (ref >59)
Glucose: 121 mg/dL — ABNORMAL HIGH (ref 65–99)
Potassium: 3.2 mmol/L — ABNORMAL LOW (ref 3.5–5.2)
Sodium: 140 mmol/L (ref 134–144)

## 2019-12-31 LAB — SARS CORONAVIRUS 2 (TAT 6-24 HRS): SARS Coronavirus 2: NEGATIVE

## 2020-01-01 ENCOUNTER — Ambulatory Visit
Admission: RE | Admit: 2020-01-01 | Discharge: 2020-01-01 | Disposition: A | Discharge status: Home / Self Care | Payer: PPO | Source: Ambulatory Visit | Attending: Internal Medicine | Admitting: Internal Medicine

## 2020-01-01 ENCOUNTER — Telehealth: Payer: Self-pay | Admitting: Internal Medicine

## 2020-01-01 DIAGNOSIS — R3589 Other polyuria: Secondary | ICD-10-CM

## 2020-01-01 DIAGNOSIS — R351 Nocturia: Secondary | ICD-10-CM

## 2020-01-01 NOTE — Progress Notes (Signed)
Left voice mail of the following instructions for tomorrows procedure.  Instructed patient on the following items: Arrival time 0530 Nothing to eat or drink after midnight No meds AM of procedure Responsible person to drive you home and stay with you for 24 hrs  Have you missed any doses of anti-coagulant Eliquis, take both doses today, no medications in the morning

## 2020-01-02 ENCOUNTER — Ambulatory Visit (HOSPITAL_COMMUNITY): Payer: PPO | Admitting: Anesthesiology

## 2020-01-02 ENCOUNTER — Encounter (HOSPITAL_COMMUNITY): Payer: Self-pay | Admitting: Cardiology

## 2020-01-02 ENCOUNTER — Other Ambulatory Visit: Payer: Self-pay

## 2020-01-02 ENCOUNTER — Ambulatory Visit (HOSPITAL_COMMUNITY)
Admission: RE | Admit: 2020-01-02 | Discharge: 2020-01-02 | Disposition: A | Discharge status: Home / Self Care | Payer: PPO | Attending: Cardiology | Admitting: Cardiology

## 2020-01-02 ENCOUNTER — Encounter (HOSPITAL_COMMUNITY)
Admission: RE | Disposition: A | Discharge status: Home / Self Care | Payer: PPO | Source: Home / Self Care | Attending: Cardiology

## 2020-01-02 DIAGNOSIS — Z7901 Long term (current) use of anticoagulants: Secondary | ICD-10-CM | POA: Diagnosis not present

## 2020-01-02 DIAGNOSIS — I10 Essential (primary) hypertension: Secondary | ICD-10-CM | POA: Diagnosis not present

## 2020-01-02 DIAGNOSIS — I4891 Unspecified atrial fibrillation: Secondary | ICD-10-CM | POA: Diagnosis not present

## 2020-01-02 DIAGNOSIS — J449 Chronic obstructive pulmonary disease, unspecified: Secondary | ICD-10-CM | POA: Insufficient documentation

## 2020-01-02 DIAGNOSIS — I712 Thoracic aortic aneurysm, without rupture: Secondary | ICD-10-CM | POA: Insufficient documentation

## 2020-01-02 DIAGNOSIS — G4733 Obstructive sleep apnea (adult) (pediatric): Secondary | ICD-10-CM | POA: Diagnosis not present

## 2020-01-02 DIAGNOSIS — E785 Hyperlipidemia, unspecified: Secondary | ICD-10-CM | POA: Diagnosis not present

## 2020-01-02 DIAGNOSIS — Z885 Allergy status to narcotic agent status: Secondary | ICD-10-CM | POA: Insufficient documentation

## 2020-01-02 DIAGNOSIS — Z7951 Long term (current) use of inhaled steroids: Secondary | ICD-10-CM | POA: Insufficient documentation

## 2020-01-02 DIAGNOSIS — K219 Gastro-esophageal reflux disease without esophagitis: Secondary | ICD-10-CM | POA: Insufficient documentation

## 2020-01-02 DIAGNOSIS — Z6834 Body mass index (BMI) 34.0-34.9, adult: Secondary | ICD-10-CM | POA: Insufficient documentation

## 2020-01-02 DIAGNOSIS — E669 Obesity, unspecified: Secondary | ICD-10-CM | POA: Diagnosis not present

## 2020-01-02 DIAGNOSIS — Z87891 Personal history of nicotine dependence: Secondary | ICD-10-CM | POA: Insufficient documentation

## 2020-01-02 DIAGNOSIS — I483 Typical atrial flutter: Secondary | ICD-10-CM | POA: Diagnosis not present

## 2020-01-02 DIAGNOSIS — Z79899 Other long term (current) drug therapy: Secondary | ICD-10-CM | POA: Diagnosis not present

## 2020-01-02 HISTORY — PX: A-FLUTTER ABLATION: EP1230

## 2020-01-02 LAB — POTASSIUM: Potassium: 3.3 mmol/L — ABNORMAL LOW (ref 3.5–5.1)

## 2020-01-02 SURGERY — A-FLUTTER ABLATION
Anesthesia: General

## 2020-01-02 MED ORDER — HEPARIN SODIUM (PORCINE) 1000 UNIT/ML IJ SOLN
INTRAMUSCULAR | Status: AC
  Filled 2020-01-02: qty 1

## 2020-01-02 MED ORDER — ONDANSETRON HCL 4 MG/2ML IJ SOLN
INTRAMUSCULAR | Status: DC | PRN
  Administered 2020-01-02: 4 mg via INTRAVENOUS

## 2020-01-02 MED ORDER — HEPARIN (PORCINE) IN NACL 1000-0.9 UT/500ML-% IV SOLN
INTRAVENOUS | Status: AC
  Filled 2020-01-02: qty 500

## 2020-01-02 MED ORDER — HEPARIN (PORCINE) IN NACL 1000-0.9 UT/500ML-% IV SOLN
INTRAVENOUS | Status: DC | PRN
  Administered 2020-01-02 (×3): 500 mL

## 2020-01-02 MED ORDER — PHENYLEPHRINE HCL-NACL 10-0.9 MG/250ML-% IV SOLN
INTRAVENOUS | Status: DC | PRN
  Administered 2020-01-02: 20 ug/min via INTRAVENOUS

## 2020-01-02 MED ORDER — LIDOCAINE 2% (20 MG/ML) 5 ML SYRINGE
INTRAMUSCULAR | Status: DC | PRN
  Administered 2020-01-02: 50 mg via INTRAVENOUS

## 2020-01-02 MED ORDER — ISOPROTERENOL HCL 0.2 MG/ML IJ SOLN
INTRAVENOUS | Status: DC | PRN
  Administered 2020-01-02: 2 ug/min via INTRAVENOUS

## 2020-01-02 MED ORDER — FENTANYL CITRATE (PF) 250 MCG/5ML IJ SOLN
INTRAMUSCULAR | Status: DC | PRN
Start: 2020-01-02 — End: 2020-01-02
  Administered 2020-01-02: 100 ug via INTRAVENOUS

## 2020-01-02 MED ORDER — DEXAMETHASONE SODIUM PHOSPHATE 10 MG/ML IJ SOLN
INTRAMUSCULAR | Status: DC | PRN
  Administered 2020-01-02: 4 mg via INTRAVENOUS

## 2020-01-02 MED ORDER — HEPARIN SODIUM (PORCINE) 1000 UNIT/ML IJ SOLN
INTRAMUSCULAR | Status: DC | PRN
  Administered 2020-01-02: 1000 [IU] via INTRAVENOUS

## 2020-01-02 MED ORDER — PROPOFOL 10 MG/ML IV BOLUS
INTRAVENOUS | Status: DC | PRN
  Administered 2020-01-02: 180 mg via INTRAVENOUS

## 2020-01-02 MED ORDER — SODIUM CHLORIDE 0.9 % IV SOLN: INTRAVENOUS | Status: DC

## 2020-01-02 MED ORDER — SUGAMMADEX SODIUM 200 MG/2ML IV SOLN
INTRAVENOUS | Status: DC | PRN
  Administered 2020-01-02: 260 mg via INTRAVENOUS

## 2020-01-02 MED ORDER — ISOPROTERENOL HCL 0.2 MG/ML IJ SOLN
INTRAMUSCULAR | Status: AC
  Filled 2020-01-02: qty 5

## 2020-01-02 MED ORDER — ONDANSETRON HCL 4 MG/2ML IJ SOLN: 4.0000 mg | Freq: Four times a day (QID) | INTRAMUSCULAR | Status: DC | PRN

## 2020-01-02 MED ORDER — APIXABAN 5 MG PO TABS
5.0000 mg | ORAL_TABLET | Freq: Two times a day (BID) | ORAL | Status: DC
  Administered 2020-01-02: 5 mg via ORAL
  Filled 2020-01-02: qty 1

## 2020-01-02 MED ORDER — ROCURONIUM BROMIDE 10 MG/ML (PF) SYRINGE
PREFILLED_SYRINGE | INTRAVENOUS | Status: DC | PRN
  Administered 2020-01-02 (×2): 10 mg via INTRAVENOUS
  Administered 2020-01-02: 70 mg via INTRAVENOUS

## 2020-01-02 SURGICAL SUPPLY — 15 items
BLANKET WARM UNDERBOD FULL ACC (MISCELLANEOUS) ×1 IMPLANT
CATH 8FR REPROCESSED SOUNDSTAR (CATHETERS) ×2 IMPLANT
CATH 8FR SOUNDSTAR REPROCESSED (CATHETERS) IMPLANT
CATH SMTCH THERMOCOOL SF DF (CATHETERS) ×1 IMPLANT
CATH WEBSTER BI DIR CS D-F CRV (CATHETERS) ×1 IMPLANT
CLOSURE PERCLOSE PROSTYLE (VASCULAR PRODUCTS) ×3 IMPLANT
PACK EP LATEX FREE (CUSTOM PROCEDURE TRAY) ×2
PACK EP LF (CUSTOM PROCEDURE TRAY) ×1 IMPLANT
PAD PRO RADIOLUCENT 2001M-C (PAD) ×2 IMPLANT
PATCH CARTO3 (PAD) ×1 IMPLANT
SHEATH CARTO VIZIGO SM CVD (SHEATH) ×1 IMPLANT
SHEATH PINNACLE 8F 10CM (SHEATH) ×2 IMPLANT
SHEATH PINNACLE 9F 10CM (SHEATH) ×1 IMPLANT
SHEATH PROBE COVER 6X72 (BAG) ×1 IMPLANT
TUBING SMART ABLATE COOLFLOW (TUBING) ×1 IMPLANT

## 2020-01-02 NOTE — H&P (Signed)
Electrophysiology Office Note:    Date:  01/02/2020  ID:  Noah Huffman, DOB 03/24/1940, MRN [id number]  PCP:  [name], MD      [facility] Cardiologist:  No primary care provider on file.  [facility] Electrophysiologist:  Vickie Epley, MD   Referring MD: Oliver Barre, PA   Chief Complaint: Atrial flutter  History of Present Illness:    Noah Huffman is a 79 y.o. male with a hx of hypertension, obstructive sleep apnea, COPD, thoracic aortic aneurysm presents to the clinic for an an opinion regarding a new diagnosis of atrial flutter at the request of Adline Peals, PA-C.  He was diagnosed with atrial flutter on November 18, 2019 at his primary care physician's office after presenting there for a preoperative evaluation for a shoulder surgery.  He reports not using CPAP for his mild OSA.  At his appointment in the A. fib clinic on August 31, he was noted to be in typical atrial flutter. He feels more winded than usual with a decreased exercise capacity over the past year. He doesn't feel palpitations. No problem with the anticoagulant.      Past Medical History:  Diagnosis Date  . Allergy    seasonal  . Asthma   . BPH (benign prostatic hypertrophy) with urinary obstruction   . Cataract    removed both eyes  . Diverticulosis   . GERD (gastroesophageal reflux disease)   . Hyperglycemia   . Hyperlipidemia   . Hypertension   . Inguinal hernia   . Neuromuscular disorder (HCC)    neuropathy in feet   . Obesity   . OSA (obstructive sleep apnea) 06/06/2013  . Osteoarthritis   . Sleep apnea    no cpap         Past Surgical History:  Procedure Laterality Date  . APPENDECTOMY    . arthroscopic knee surgery    . CATARACT EXTRACTION  2012   bilateral  . COLONOSCOPY    . HAMMER TOE SURGERY    . INGUINAL HERNIA REPAIR     right  . POLYPECTOMY    . TONSILLECTOMY AND ADENOIDECTOMY    . TOTAL HIP ARTHROPLASTY  2007     right    Current Medications: Active Medications      Current Meds  Medication Sig  . albuterol (VENTOLIN HFA) 108 (90 Base) MCG/ACT inhaler INHALE 2 PUFFS PO Q 4 H PRN  . apixaban (ELIQUIS) 5 MG TABS tablet Take 1 tablet (5 mg total) by mouth 2 (two) times daily.  . budesonide-formoterol (SYMBICORT) 160-4.5 MCG/ACT inhaler Inhale 2 puffs into the lungs 2 (two) times a day.  . cetirizine (ZYRTEC) 10 MG tablet TK 1 T PO QD PRN  . gabapentin (NEURONTIN) 600 MG tablet TK 2 TS PO TID  . HYDROcodone-acetaminophen (NORCO/VICODIN) 5-325 MG tablet Take 1 tablet by mouth every 6 (six) hours.  Marland Kitchen losartan-hydrochlorothiazide (HYZAAR) 100-25 MG tablet Take 1 tablet by mouth daily.  . Multiple Vitamins-Minerals (PRESERVISION AREDS 2) CAPS Take by mouth.  . rosuvastatin (CRESTOR) 10 MG tablet   . Tiotropium Bromide Monohydrate (SPIRIVA RESPIMAT) 2.5 MCG/ACT AERS Inhale 2 puffs into the lungs daily.      Current Facility-Administered Medications for the 11/25/19 encounter (Office Visit) with Vickie Epley, MD  Medication  . 0.9 %  sodium chloride infusion       Allergies:   Morphine and related   Social History        Socioeconomic History  .  Marital status: Married    Spouse name: Not on file  . Number of children: 2  . Years of education: Not on file  . Highest education level: Not on file  Occupational History  . Occupation: retired  Tobacco Use  . Smoking status: Former Smoker    Packs/day: 2.50    Years: 25.00    Pack years: 62.50    Types: Cigarettes    Quit date: 03/20/1988    Years since quitting: 31.7  . Smokeless tobacco: Never Used  Substance and Sexual Activity  . Alcohol use: Yes    Alcohol/week: 1.0 standard drink    Types: 1 Shots of liquor per week    Comment: 1-2 drinks per day or less   . Drug use: No  . Sexual activity: Not on file  Other Topics Concern  . Not on file  Social History Narrative  . Not on file   Social  Determinants of Health      Financial Resource Strain:   . Difficulty of Paying Living Expenses: Not on file  Food Insecurity:   . Worried About Charity fundraiser in the Last Year: Not on file  . Ran Out of Food in the Last Year: Not on file  Transportation Needs:   . Lack of Transportation (Medical): Not on file  . Lack of Transportation (Non-Medical): Not on file  Physical Activity:   . Days of Exercise per Week: Not on file  . Minutes of Exercise per Session: Not on file  Stress:   . Feeling of Stress : Not on file  Social Connections:   . Frequency of Communication with Friends and Family: Not on file  . Frequency of Social Gatherings with Friends and Family: Not on file  . Attends Religious Services: Not on file  . Active Member of Clubs or Organizations: Not on file  . Attends Archivist Meetings: Not on file  . Marital Status: Not on file     Family History: The patient's family history includes Brain cancer in his brother; Colon cancer (age of onset: 6) in his mother; Leukemia in his father; Pneumonia in his father; Pulmonary fibrosis in his brother; Transient ischemic attack in his father. There is no history of Colon polyps, Esophageal cancer, Rectal cancer, or Stomach cancer.  ROS:   Please see the history of present illness.    All other systems reviewed and are negative.  EKGs/Labs/Other Studies Reviewed:    The following studies were reviewed today: Echo, ecg  02/11/2019 Echo . Left ventricular ejection fraction, by visual estimation, is 55 to  60%. The left ventricle has normal function. There is mildly increased  left ventricular hypertrophy.  2. Left ventricular diastolic parameters are consistent with Grade I  diastolic dysfunction (impaired relaxation).  3. The left ventricle has no regional wall motion abnormalities.  4. Global right ventricle has normal systolic function.The right  ventricular size is normal. No increase in right  ventricular wall  thickness.  5. Left atrial size was normal.  6. Right atrial size was normal.  7. The mitral valve is normal in structure. No evidence of mitral valve  regurgitation.  8. The tricuspid valve is normal in structure. Tricuspid valve  regurgitation is not demonstrated.  9. The aortic valve is tricuspid. Aortic valve regurgitation is not  visualized. No evidence of aortic valve sclerosis or stenosis.  10. 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  11. The inferior vena cava  is normal in size with greater than 50%  respiratory variability, suggesting right atrial pressure of 3 mmHg.  12. TR signal is inadequate for assessing pulmonary artery systolic  pressure.   11/18/2019 ECG shows typical appearing atrial flutter with a ventricular rate of 90 bpm.  June 01, 2015 ECG shows sinus bradycardia  EKG:  The ekg ordered today demonstrates atrial flutter.  Recent Labs: No results found for requested labs within last 8760 hours.  Recent Lipid Panel Labs (Brief)  No results found for: CHOL, TRIG, HDL, CHOLHDL, VLDL, LDLCALC, LDLDIRECT    Physical Exam:    VS:  BP 110/64   Pulse 92   Ht 6\' 3"  (1.905 m)   Wt 279 lb (126.6 kg)   SpO2 96%   BMI 34.87 kg/m        Wt Readings from Last 3 Encounters:  11/25/19 279 lb (126.6 kg)  11/18/19 278 lb (126.1 kg)  04/09/19 281 lb 9.6 oz (127.7 kg)     GEN: Well nourished, well developed in no acute distress HEENT: Normal NECK: No JVD; No carotid bruits LYMPHATICS: No lymphadenopathy CARDIAC: RRR (AFL), no murmurs, rubs, gallops RESPIRATORY:  Clear to auscultation without rales, wheezing or rhonchi  ABDOMEN: Soft, non-tender, non-distended MUSCULOSKELETAL:  No edema; No deformity  SKIN: Warm and dry NEUROLOGIC:  Alert and oriented x 3 PSYCHIATRIC:  Normal affect   ASSESSMENT:    1. Typical atrial flutter ([place])   2. Essential hypertension   3. OSA (obstructive sleep apnea)   4.  Chronic obstructive pulmonary disease, unspecified COPD type ([place])    PLAN:    In order of problems listed above:  1. Typical atrial flutter CHA2DS2-VASc of 3 for age and hypertension.  Discussed management options including pharmacologic therapy, cardioversion, ablation in the patient wishes to proceed with ablation.  To confirm no concomitant atrial fibrillation, will order a 2-week ZIO monitor as this would change the ablation strategy.   Risk, benefits, and alternatives to EP study and radiofrequency ablation for afib/flutter were also discussed in detail today. These risks include but are not limited to stroke, bleeding, vascular damage, tamponade, perforation, damage to the esophagus, lungs, and other structures, pulmonary vein stenosis, worsening renal function, and death. The patient understands these risk and wishes to proceed.  We will therefore proceed with catheter ablation at the next available time.  Carto, ICE, anesthesia are requested for the procedure.    2. HTN Controlled on today's visit. Continue Hyzaar  3. OSA  4. COPD At baseline today. Continue symbicort and spiriva.    ------------------------------------------------------------------------------------------------------------------ I have seen, examined the patient this AM. Plan to proceed with AFL ablation given zio showing only AFL.    Vickie Epley, MD 01/02/2020 7:13 AM

## 2020-01-02 NOTE — Transfer of Care (Signed)
Immediate Anesthesia Transfer of Care Note  Patient: Noah Huffman  Procedure(s) Performed: A-FLUTTER ABLATION (N/A )  Patient Location: PACU  Anesthesia Type:General  Level of Consciousness: awake and patient cooperative  Airway & Oxygen Therapy: Patient Spontanous Breathing and Patient connected to nasal cannula oxygen  Post-op Assessment: Report given to RN and Post -op Vital signs reviewed and stable  Post vital signs: Reviewed and stable  Last Vitals:  Vitals Value Taken Time  BP 147/96 01/02/20 0946  Temp    Pulse 84 01/02/20 0946  Resp 13 01/02/20 0946  SpO2 96 % 01/02/20 0946  Vitals shown include unvalidated device data.  Last Pain:  Vitals:   01/02/20 0931  TempSrc:   PainSc: 0-No pain      Patients Stated Pain Goal: 3 (73/73/08 1683)  Complications: No complications documented.

## 2020-01-02 NOTE — Progress Notes (Signed)
Up to bathroom and tolerated well; right groin stable, no bleeding or hematoma; Dr Quentin Ore in and ok to d/c home

## 2020-01-02 NOTE — Anesthesia Preprocedure Evaluation (Addendum)
Anesthesia Evaluation  Patient identified by MRN, date of birth, ID band Patient awake    Reviewed: Allergy & Precautions, NPO status , Patient's Chart, lab work & pertinent test results  Airway Mallampati: III  TM Distance: >3 FB Neck ROM: Full    Dental  (+) Teeth Intact, Dental Advisory Given, Missing,    Pulmonary asthma , sleep apnea , COPD, former smoker,    Pulmonary exam normal        Cardiovascular hypertension, + dysrhythmias Atrial Fibrillation  Rhythm:Regular Rate:Normal     Neuro/Psych  Neuromuscular disease negative psych ROS   GI/Hepatic Neg liver ROS, GERD  ,  Endo/Other  negative endocrine ROS  Renal/GU negative Renal ROS     Musculoskeletal  (+) Arthritis ,   Abdominal (+) + obese,   Peds  Hematology negative hematology ROS (+)   Anesthesia Other Findings   Reproductive/Obstetrics                          Anesthesia Physical Anesthesia Plan  ASA: III  Anesthesia Plan: General   Post-op Pain Management:    Induction: Intravenous  PONV Risk Score and Plan: 2 and Ondansetron, Treatment may vary due to age or medical condition and Dexamethasone  Airway Management Planned: Oral ETT  Additional Equipment: None  Intra-op Plan:   Post-operative Plan: Extubation in OR  Informed Consent:   Plan Discussed with: CRNA  Anesthesia Plan Comments:         Anesthesia Quick Evaluation

## 2020-01-02 NOTE — Anesthesia Postprocedure Evaluation (Signed)
Anesthesia Post Note  Patient: Noah Huffman  Procedure(s) Performed: A-FLUTTER ABLATION (N/A )     Patient location during evaluation: PACU Anesthesia Type: General Level of consciousness: awake and alert Pain management: pain level controlled Vital Signs Assessment: post-procedure vital signs reviewed and stable Respiratory status: spontaneous breathing, nonlabored ventilation, respiratory function stable and patient connected to nasal cannula oxygen Cardiovascular status: blood pressure returned to baseline and stable Postop Assessment: no apparent nausea or vomiting Anesthetic complications: no   No complications documented.  Last Vitals:  Vitals:   01/02/20 1025 01/02/20 1030  BP: (!) 141/92 140/82  Pulse: 83 82  Resp: 11 11  Temp:    SpO2: 96% 96%    Last Pain:  Vitals:   01/02/20 0931  TempSrc:   PainSc: 0-No pain                 Effie Berkshire

## 2020-01-02 NOTE — Discharge Instructions (Signed)
Cardiac Ablation, Care After This sheet gives you information about how to care for yourself after your procedure. Your health care provider may also give you more specific instructions. If you have problems or questions, contact your health care provider. What can I expect after the procedure? After the procedure, it is common to have:  Bruising around your puncture site.  Tenderness around your puncture site.  Skipped heartbeats.  Tiredness (fatigue). Follow these instructions at home: Puncture site care   Follow instructions from your health care provider about how to take care of your puncture site. Make sure you: ? Wash your hands with soap and water before you change your bandage (dressing). If soap and water are not available, use hand sanitizer. ? Change your dressing as told by your health care provider. ? Leave stitches (sutures), skin glue, or adhesive strips in place. These skin closures may need to stay in place for up to 2 weeks. If adhesive strip edges start to loosen and curl up, you may trim the loose edges. Do not remove adhesive strips completely unless your health care provider tells you to do that.  Check your puncture site every day for signs of infection. Check for: ? Redness, swelling, or pain. ? Fluid or blood. If your puncture site starts to bleed, lie down on your back, apply firm pressure to the area, and contact your health care provider. ? Warmth. ? Pus or a bad smell. Driving  Ask your health care provider when it is safe for you to drive again after the procedure.  Do not drive or use heavy machinery while taking prescription pain medicine.  Do not drive for 24 hours if you were given a medicine to help you relax (sedative) during your procedure. Activity  Avoid activities that take a lot of effort for at least 3 days after your procedure.  Do not lift anything that is heavier than 10 lb (4.5 kg), or the limit that you are told, until your health  care provider says that it is safe.  Return to your normal activities as told by your health care provider. Ask your health care provider what activities are safe for you. General instructions  Take over-the-counter and prescription medicines only as told by your health care provider.  Do not use any products that contain nicotine or tobacco, such as cigarettes and e-cigarettes. If you need help quitting, ask your health care provider.  Do not take baths, swim, or use a hot tub until your health care provider approves.  Do not drink alcohol for 24 hours after your procedure.  Keep all follow-up visits as told by your health care provider. This is important. Contact a health care provider if:  You have redness, mild swelling, or pain around your puncture site.  You have fluid or blood coming from your puncture site that stops after applying firm pressure to the area.  Your puncture site feels warm to the touch.  You have pus or a bad smell coming from your puncture site.  You have a fever.  You have chest pain or discomfort that spreads to your neck, jaw, or arm.  You are sweating a lot.  You feel nauseous.  You have a fast or irregular heartbeat.  You have shortness of breath.  You are dizzy or light-headed and feel the need to lie down.  You have pain or numbness in the arm or leg closest to your puncture site. Get help right away if:  Your puncture   site suddenly swells.  Your puncture site is bleeding and the bleeding does not stop after applying firm pressure to the area. These symptoms may represent a serious problem that is an emergency. Do not wait to see if the symptoms will go away. Get medical help right away. Call your local emergency services (911 in the [place] Do not drive yourself to the hospital. Summary  After the procedure, it is normal to have bruising and tenderness at the puncture site in your groin, neck, or forearm.  Check your puncture site every  day for signs of infection.  Get help right away if your puncture site is bleeding and the bleeding does not stop after applying firm pressure to the area. This is a medical emergency. This information is not intended to replace advice given to you by your health care provider. Make sure you discuss any questions you have with your health care provider. Document Revised: 02/16/2017 Document Reviewed: 06/15/2016 Elsevier Patient Education  [place] procedure care instructions No driving for 4 days. No lifting over 5 lbs for 1 week. No vigorous or sexual activity for 1 week. You may return to work/your usual activities on 01/09/2020. Keep procedure site clean & dry. If you notice increased pain, swelling, bleeding or pus, call/return!  You may shower, but no soaking baths/hot tubs/pools for 1 week.

## 2020-01-02 NOTE — Anesthesia Procedure Notes (Signed)
Procedure Name: Intubation Date/Time: 01/02/2020 7:43 AM Performed by: Renato Shin, CRNA Pre-anesthesia Checklist: Patient identified, Emergency Drugs available, Suction available and Patient being monitored Patient Re-evaluated:Patient Re-evaluated prior to induction Oxygen Delivery Method: Circle system utilized Preoxygenation: Pre-oxygenation with 100% oxygen Induction Type: IV induction Ventilation: Mask ventilation without difficulty and Oral airway inserted - appropriate to patient size Laryngoscope Size: Miller and 3 Grade View: Grade I Tube type: Oral Tube size: 7.5 mm Number of attempts: 1 Airway Equipment and Method: Stylet and Oral airway Placement Confirmation: ETT inserted through vocal cords under direct vision,  positive ETCO2 and breath sounds checked- equal and bilateral Secured at: 22 cm Tube secured with: Tape Dental Injury: Teeth and Oropharynx as per pre-operative assessment

## 2020-01-02 NOTE — Telephone Encounter (Signed)
Per last visit with MR   Hoarse voice quality Dry mouth  - followup per Dr Elgie Congo  -Please ask him if your gabapentin should be increased  I do not see that we prescribed this for him  LMTCB to discuss and also due for appt with MR

## 2020-01-05 ENCOUNTER — Encounter (HOSPITAL_COMMUNITY): Payer: Self-pay | Admitting: Cardiology

## 2020-01-08 NOTE — Telephone Encounter (Signed)
Called and spoke to pt. Pt states his PCP refilled the gabapentin. Pt aware to keep follow with MR on 10/27. Nothing further needed at this time.

## 2020-01-14 ENCOUNTER — Other Ambulatory Visit: Payer: Self-pay

## 2020-01-14 ENCOUNTER — Encounter: Payer: Self-pay | Admitting: Internal Medicine

## 2020-01-14 ENCOUNTER — Ambulatory Visit: Payer: PPO | Admitting: Internal Medicine

## 2020-01-14 VITALS — BP 140/76 | HR 68 | Temp 97.4°F | Ht 76.0 in | Wt 276.2 lb

## 2020-01-14 DIAGNOSIS — J479 Bronchiectasis, uncomplicated: Secondary | ICD-10-CM | POA: Diagnosis not present

## 2020-01-14 DIAGNOSIS — R053 Chronic cough: Secondary | ICD-10-CM

## 2020-01-14 DIAGNOSIS — R0789 Other chest pain: Secondary | ICD-10-CM

## 2020-01-14 DIAGNOSIS — R490 Dysphonia: Secondary | ICD-10-CM | POA: Diagnosis not present

## 2020-01-14 DIAGNOSIS — Z836 Family history of other diseases of the respiratory system: Secondary | ICD-10-CM

## 2020-01-14 DIAGNOSIS — R0602 Shortness of breath: Secondary | ICD-10-CM | POA: Diagnosis not present

## 2020-01-14 MED ORDER — SPIRIVA RESPIMAT 2.5 MCG/ACT IN AERS
2.0000 | INHALATION_SPRAY | Freq: Every day | RESPIRATORY_TRACT | 5 refills | Status: DC
Start: 2020-01-14 — End: 2020-12-08

## 2020-01-14 NOTE — Progress Notes (Signed)
IOV 08/24/2017  Chief Complaint  Patient presents with  . Consult    Referred by Dr. Antonino Hedge due to trouble coughing, coughing up mucus that is dark gray in color, and breathing has become worse. Pt also has c/o hoarseness that began 6 months ago. Pt's main complaint is labored breathing. Pt states he had a brother who died of pulmonary fibrosis.    Noah Huffman , 79 y.o. , with dob Mar 21, 1940 and male ,Not Hispanic or Latino from [address] [place] [address] - presents to pulm clinic for dyspnea and cough.  He is originally from [place] area and moved to [place] in the late 1960s and in [place] since the 1970s.His brother died at age 70 from pulmonary fibrosis lasted at the [facility] he is also worried about that.  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  He smoked heavily over 62 pack smoking history but quit over 25 years ago.  He tells me for the last 4 to 5 years he has had shortness of breath that is slowly progre  ssive.  Definitely present with exertion relieved by rest.  Walking today in our office 185 feet x 3 laps on room air he did have some mild shortness of breath but did not desaturate.  There is no associated chest pain.    He does have a chronic cough that for which she was started on Symbicort few to several years ago and it did seem to help but in the last year or 2 the cough is deteriorated and associated with some yellow sputum production.  He does have some on and off hoarseness of voice that is constant for the last few years.  He recollects having had a ENT evaluation a few years ago that was normal.  He clears his throat.  There is excess feeling of postnasal drip there is some occasional choking episodes he finds the cough annoying and there is an occasional sensation of something sticking in his throat but there is no heartburn or dysphagia or coughing after lying down.  His RSI cough score  is 19.  He is noticed to be on losartan  In 2017 he did have a plain old exercise treadmill test that was normal.   FenO 08/24/2017 = 22 ppb on spiriva and symbicort.  Recently started on Symbicort in the last few months by Dr. Fredderick Phenix allergist.  Patient tells me that allergy testing was negative/normal    In 2014 he had spirometry that shows both reduced FVC and FEV1 with a reduced ratio slightly favoring obstruction  He had a CT chest aug 2018 that shows some bibasilar atelectasis this was a CT chest angiography.  In 2017 he had a CT abdomen pelvis that does not show these applications.  I personally visualized the CT chest and confirmed the findings.    OV 10/08/2017  Chief Complaint  Patient presents with  . Follow-up    PFT done today, clears throat,    Follow-up cough and shortness of breath. He continues to be is on Spiriva and Symbicort. In the interim his symptoms of improvement. He only function test today and his FEV1 is 71% associated with moderate obstruction. DLCO is mildly reduced. Overall he feels great. We discussed pulmonary rehabilitation but he says he will exercise on his own and will think about attending rehabilitation center if x-ray does not help. He had high resolution CT chest  that I personally visualized and it shows bilateral lower lobe bronchiectasis Currently no sputum production.  OV 01/01/2019  Subjective:  Patient ID: Noah Huffman, male , DOB: January 13, 1941 , age 65 y.o. , MRN: [id number] , ADDRESS: [place] [address]   01/01/2019 -   Chief Complaint  Patient presents with  . Bronchiectasis without complication    Patient said there are times when the breathing is worse, especially when hot. Recently had chest CT and was told to some to clinic to discuss.   Follow-up bilateral lower lobe bronchiectasis on Spiriva and Symbicort but with a family history of pulmonary fibrosis.  [His brother who is 5 years younger than him died in  06/17/15 at the age of 64 from pulmonary fibrosis]  HPI GUNTHER ZAWADZKI 79 y.o. -presents for follow-up.  Last seen in July 2019.  Since then he has been on Spiriva and Symbicort.  He says the shortness of breath is stable but his chronic cough with sputum production has gotten worse a little bit especially early in the morning.  He is worried he might have pulmonary fibrosis because his brother died from it.  He also had a recent CT chest of the aorta that reported the bronchiectasis.  Therefore his allergist Dr. Fredderick Phenix is asked him to be reevaluated with [place].  Otherwise no new problems.  The only other health issue in the last 1 year is his left hip pain.        OV 01/28/2019  Subjective:  Patient ID: Noah Huffman, male , DOB: February 10, 1941 , age 33 y.o. , MRN: [id number] , ADDRESS: [place] [address]   01/28/2019 -   Chief Complaint  Patient presents with  . Follow-up    PFT and CT chest performed since last visit and pt is here today to go over the results. Pt states his cough is still the same since last visit.   Follow-up bilateral lower lobe bronchiectasis on Spiriva and Symbicort but with a family history of pulmonary fibrosis.  [His brother who is 5 years younger than him died in 2015-06-17 at the age of 44 from pulmonary fibrosis]  HPI EMAAD NANNA 79 y.o. -returns for follow-up of her pulmonary function testing and high-resolution CT chest.  The high-resolution CT chest is again described as bronchiectasis in the bilateral lower lobes.  To me it looks a little bit like reticulation on my personal visualization and I wonder if there is early fibrosis.  Of note, somebody in radiology told him that he had pulmonary fibrosis and he got upset.  He is very scared about getting pulmonary fibrosis because his brother died from the same.  Regardless the level of radiologic abnormality appears to be very mild.  His pulmonary function test itself is stable compared to a year  ago.  However his symptoms appear to be out of proportion to the pulmonary findings on the CT scan.  He tells me that his cough is severe RSI cough score is 27.  He clears his throat a lot.  He feels like there is lot of phlegm in the throat.  When I asked him if there is a sensation there is a "frog in the throat" he did replied in the affirmative.  He clears her throat a lot.  He did that even during the office visit.  But the cough is stable compared to a year ago  His shortness of breath he states is worse.  Class  III on exertion relieved by rest.  He has significant visceral obesity.  He says his been tested for sleep apnea 3 times in the past but this has been negative.  His last cardiac stress test was June 19, 2015 a plain old treadmill test that I reviewed the result and was normal.  It has been several years since he had an echocardiogram.  He is willing to have another echocardiogram and if this is normal undergo pulmonary stress test.  He is reporting new onset of hoarseness of voice for the last 1 year.  It seems to be intermittent.  He states his wife is also getting it.  He has previous history of smoking.  Has been on Symbicort for 7 years and Spiriva recently.  He has not seen ENT in several years or never seen them at all.    CT chest high resolution  IMPRESSION: 1. No evidence of interstitial lung disease. 2. Mild bibasilar bronchiectasis and scarring, as before. 3. Aortic atherosclerosis (ICD10-170.0). Coronary artery calcification.   Electronically Signed   By: Lorin Picket M.D.   On: 01/24/2019 16:29  OV 04/09/2019  Subjective:  Patient ID: Noah Huffman, male , DOB: 08-01-1940 , age 75 y.o. , MRN: [id number] , ADDRESS: [address] [place] [address]  Follow-up bilateral lower lobe bronchiectasis on Spiriva and Symbicort but with a family history of pulmonary fibrosis.  [His brother who is 5 years younger than him died in June 19, 2015 at the age of 106 from pulmonary  fibrosis]   04/09/2019 -   Chief Complaint  Patient presents with  . Follow-up    f/u bronchiectasis, minimal cough with phlegm with cloudy mucus     HPI KARDELL VIRGIL 79 y.o. -presents for his follow-up of multiple issues.  He is on Spiriva and Symbicort but despite this he has significant amount of cough he is clearing his throat all the time including even at this visit.  He says he has associated dry mouth.  He is already on pre-existing gabapentin.  Med review shows losartan.  He saw Dr. Lorelee Cover and ENT after my referral to him at the last visit.  Apparently has dry mouth and the trying some humidification.  He is some better with his cough.  His chronic shortness of breath continues.  He had an echocardiogram that shows diastolic dysfunction.  He now tells me that because of the pandemic he stopped exercising at the [facility] and he regained 20 pounds and this then contributed the shortness of breath.  He initially agreed for pulmonary stress test but after discussing this with him we think the most likely etiology is obesity, diastolic dysfunction physical deconditioning therefore going to hold off.  I discussed his bilateral lower lobe bronchiectasis at the case conference.  He is very concerned about pulmonary fibrosis because his brother passed away from it.  He also has a previous 60 pack smoking and therefore he is a high risk candidate for pulmonary fibrosis especially being male, Caucasian and age greater than 35.  However the case conference it was resolved that he only had bronchiectasis and no evidence of fibrosis.  Review of the labs indicate that he still needs to have bronchiectasis work-up    OV 01/14/2020   Subjective:  Patient ID: Noah Huffman, male , DOB: 1940-06-14, age 38 y.o. years. , MRN: [id number],  ADDRESS: [place] [address] PCP  [name], MD Providers : Treatment Team:  Attending Provider: Brand Males, MD Patient  Care Team: [name], MD as PCP - General (Internal Medicine) Vickie Epley, MD as PCP - Electrophysiology (Cardiology)    Chief Complaint  Patient presents with  . Follow-up    worsening chest discomfort over last 2 months, incrfeased pain with sneezing     Follow-up bilateral lower lobe bronchiectasis on Spiriva and Symbicort but with a family history of pulmonary fibrosis.  [His brother who is 5 years younger than him died in 2015/06/23 at the age of 39 from pulmonary fibrosiAnd a first cousin in 2021s ]    HPI MILANO ROSEVEAR 79 y.o. -presents for follow-up.  Last seen in early part of the year.  Since then overall shortness of breath and wheezing and cough are stable.  He says he has had atrial flutter and then dealing with urinary tract infections.  He has appointment with his urologist.  He does not believe he is ever taken [place].  He is now on Eliquis.  His right shoulder is bothering him and he plans to have surgery.  In terms of shortness of breath cough and wheezing these are stable.  But he tells me that he has since spring 2021 this intermittent pain in the right supramammary/infraclavicular area.  This is dull and constant.  He thinks it might be related to the shoulder but he is worried it might be related to the lungs.  He tells me that this year another first cousin died from pulmonary fibrosis he is really worried that he might be getting pulmonary fibrosis.   Earlier in the year his autoimmune antibodies were negative but blood IgE was slightly high he still continue Spiriva and Symbicort which he thinks is helping him.  He wants refills for this.   Dr Lorenza Cambridge Reflux Symptom Index (> 13-15 suggestive of LPR cough)  01/28/2019   Hoarseness of problem with voice 4  Clearing  Of Throat 4  Excess throat mucus or feeling of post nasal drip 4  Difficulty swallowing food, liquid or tablets 1  Cough after eating or lying down 3  Breathing difficulties or choking episodes 3  Troublesome  or annoying cough 2  Sensation of something sticking in throat or lump in throat 3  Heartburn, chest pain, indigestion, or stomach acid coming up 3  TOTAL 27     Simple office walk 185 feet x  3 laps goal with forehead probe 08/24/2017   O2 used Room air  Number laps completed 3  Comments about pace Normal pace,, tall man,   Resting Pulse Ox/HR 98% and 61/min  Final Pulse Ox/HR 96% and 88/min  Desaturated </= 88% no  Desaturated <= 3% points no  Got Tachycardic >/= 90/min no  Symptoms at end of test Mild dyspneic  Miscellaneous comments x   Results for JONATHON, TAN (MRN [id number]) as of 01/28/2019 10:58  Ref. Range 10/08/2017 11:44 01/21/2019 13:47  FVC-Pre Latest Units: L 4.03 3.73  FVC-%Pred-Pre Latest Units: % 79 74  Results for AMEER, SANDEN (MRN [id number]) as of 01/28/2019 10:58  Ref. Range 10/08/2017 11:44 01/21/2019 13:47  TLC Latest Units: L 6.82 7.91  TLC % pred Latest Units: % 84 98  Results for ANEES, VANECEK (MRN [id number]) as of 01/28/2019 10:58  Ref. Range 10/08/2017 11:44 01/21/2019 13:47  DLCO unc Latest Units: ml/min/mmHg 28.61 25.55  DLCO unc % pred Latest Units: % 73 88    PFT Results Latest Ref Rng & Units 01/21/2019 10/08/2017  FVC-Pre L 3.73 4.03  FVC-Predicted Pre % 74 79  FVC-Post L 3.69 3.68  FVC-Predicted Post % 73 72  Pre FEV1/FVC % % 64 71  Post FEV1/FCV % % 70 72  FEV1-Pre L 2.40 2.85  FEV1-Predicted Pre % 65 77  FEV1-Post L 2.58 2.63  DLCO uncorrected ml/min/mmHg 25.55 28.61  DLCO UNC% % 88 73  DLVA Predicted % 108 83  TLC L 7.91 6.82  TLC % Predicted % 98 84  RV % Predicted % 139 91      has a past medical history of Allergy, Asthma, Atrial fibrillation (HCC), BPH (benign prostatic hypertrophy) with urinary obstruction, Cataract, Diverticulosis, GERD (gastroesophageal reflux disease), Hyperglycemia, Hyperlipidemia, Hypertension, Inguinal hernia, Neuromuscular disorder ([place]), Obesity, OSA (obstructive sleep apnea) (06/06/2013),  Osteoarthritis, and Sleep apnea.   reports that he quit smoking about 31 years ago. His smoking use included cigarettes. He has a 62.50 pack-year smoking history. He has never used smokeless tobacco.  Past Surgical History:  Procedure Laterality Date  . A-FLUTTER ABLATION N/A 01/02/2020   Procedure: A-FLUTTER ABLATION;  Surgeon: Vickie Epley, MD;  Location: [facility];  Service: Cardiovascular;  Laterality: N/A;  . APPENDECTOMY    . arthroscopic knee surgery    . CATARACT EXTRACTION  2012   bilateral  . COLONOSCOPY    . HAMMER TOE SURGERY    . INGUINAL HERNIA REPAIR     right  . POLYPECTOMY    . TONSILLECTOMY AND ADENOIDECTOMY    . TOTAL HIP ARTHROPLASTY  2007   right    Allergies  Allergen Reactions  . Morphine And Related Rash    Immunization History  Administered Date(s) Administered  . Fluad Quad(high Dose 65+) 12/02/2018, 12/24/2019  . Influenza,inj,Quad PF,6+ Mos 01/14/2017  . PFIZER SARS-COV-2 Vaccination 05/15/2019, 06/10/2019    Family History  Problem Relation Age of Onset  . Leukemia Father   . Transient ischemic attack Father   . Pneumonia Father   . Colon cancer Mother 68  . Brain cancer Brother   . Pulmonary fibrosis Brother   . Colon polyps Neg Hx   . Esophageal cancer Neg Hx   . Rectal cancer Neg Hx   . Stomach cancer Neg Hx      Current Outpatient Medications:  .  albuterol (VENTOLIN HFA) 108 (90 Base) MCG/ACT inhaler, Inhale 2 puffs into the lungs every 4 (four) hours as needed for wheezing or shortness of breath. , Disp: , Rfl:  .  apixaban (ELIQUIS) 5 MG TABS tablet, Take 1 tablet (5 mg total) by mouth 2 (two) times daily., Disp: 60 tablet, Rfl: 3 .  budesonide-formoterol (SYMBICORT) 160-4.5 MCG/ACT inhaler, Inhale 2 puffs into the lungs 2 (two) times a day., Disp: 10.2 g, Rfl: 5 .  cetirizine (ZYRTEC) 10 MG tablet, Take 10 mg by mouth daily as needed for allergies. , Disp: , Rfl: 4 .  gabapentin (NEURONTIN) 600 MG tablet, Take  900 mg by mouth 3 (three) times daily. , Disp: , Rfl: 6 .  losartan-hydrochlorothiazide (HYZAAR) 100-25 MG tablet, Take 1 tablet by mouth daily., Disp: , Rfl:  .  Multiple Vitamins-Minerals (PRESERVISION AREDS 2) CAPS, Take 1 capsule by mouth daily. , Disp: , Rfl:  .  rosuvastatin (CRESTOR) 10 MG tablet, Take 10 mg by mouth daily. , Disp: , Rfl:  .  Tiotropium Bromide Monohydrate (SPIRIVA RESPIMAT) 2.5 MCG/ACT AERS, Inhale 2 puffs into the lungs daily., Disp: 1 Inhaler, Rfl: 5 .  traMADol (ULTRAM) 50 MG tablet, Take 50 mg by mouth  every 6 (six) hours as needed., Disp: , Rfl:   Current Facility-Administered Medications:  .  0.9 %  sodium chloride infusion, 500 mL, Intravenous, Once, Irene Shipper, MD      Objective:   Vitals:   01/14/20 1123  BP: 140/76  Pulse: 68  Temp: (!) 97.4 F (36.3 C)  TempSrc: Temporal  SpO2: 96%  Weight: 276 lb 3.2 oz (125.3 kg)  Height: 6\' 4"  (1.93 m)    Estimated body mass index is 33.62 kg/m as calculated from the following:   Height as of this encounter: 6\' 4"  (1.93 m).   Weight as of this encounter: 276 lb 3.2 oz (125.3 kg).  @WEIGHTCHANGE @  Filed Weights   01/14/20 1123  Weight: 276 lb 3.2 oz (125.3 kg)     Physical Exam  General: No distress. OBESE Neuro: Alert and Oriented x 3. GCS 15. Speech normal Psych: Pleasant Resp:  Barrel Chest - no.  Wheeze - no, Crackles - no, No overt respiratory distress CVS: Normal heart sounds. Murmurs - no Ext: Stigmata of Connective Tissue Disease - no HEENT: Normal upper airway. PEERL +. No post nasal drip        Assessment:       ICD-10-CM   1. Right-sided chest wall pain  R07.89   2. Bronchiectasis without complication (English)  B44.9   3. Family history of pulmonary fibrosis  Z83.6   4. Shortness of breath  R06.02   5. Chronic cough  R05.3   6. Hoarse voice quality  R49.0        Plan:     Patient Instructions  Family history of pulmonary fibrosis Bronchiectasis without complication  (HCC) Previous 60 pack smoking history New onset Rt infra-clavicular/supra mammary chest discomfort since April 2021  -Per case conference in December 2020 there is no evidence of pulmonary fibrosis but you have bronchiectasis - glad shortnes of breath, cough and wheezing are stable -Did not that he have this new discomfort/pain in the right upper chest area -Autoimmune antibodies and early 2021 are normal except slight elevation in blood IgE which is an allergy marker -Glad ENT evaluation in the recent few years is normal -Noted another cousin died from pulmonary fibrosis  Plan -Do repeat high-resolution CT chest supine and prone in the next few weeks -Do FeNO/exhaled nitric oxide test in the next few weeks -Continue current inhaler therapy -Spiriva and Symbicort as before  -We will readdress need for this at follow-up   -Do refill for this today -We will make decision on preoperative evaluation for the right shoulder based on above data - do not take macrobid for any UTI - please tell urologist  - will add this to allergy list - as contraindication   Shortness of breath - out of proportion to lung findings  -Likely drug due to weight gain, visceral obesity, loss of physical fitness and diastolic dysfunction which is stiff heart muscle  Plan  -Hold off on pulmonary stress test -Encourage reentry into physical activity and weight loss    Chronic cough - again out of proportion to lung fnmdings Could 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 [name] to review test results otherwise with nurse practitioner after completing above tests     SIGNATURE    Dr. Brand Males, M.D., F.C.C.P,  Pulmonary and Critical Care Medicine Staff Physician, [facility] Director -  Interstitial Lung Disease  Program  Pulmonary [place] at [place], [place], [address]  Pager: [phone], If no answer or between  15:00h - 7:00h: call [phone] Telephone: [phone]  11:45 AM 01/14/2020

## 2020-01-14 NOTE — Patient Instructions (Addendum)
Family history of pulmonary fibrosis Bronchiectasis without complication (HCC) Previous 60 pack smoking history New onset Rt infra-clavicular/supra mammary chest discomfort since April 2021  -Per case conference in December 2020 there is no evidence of pulmonary fibrosis but you have bronchiectasis - glad shortnes of breath, cough and wheezing are stable -Did not that he have this new discomfort/pain in the right upper chest area -Autoimmune antibodies and early 2021 are normal except slight elevation in blood IgE which is an allergy marker -Glad ENT evaluation in the recent few years is normal -Noted another cousin died from pulmonary fibrosis  Plan -Do repeat high-resolution CT chest supine and prone in the next few weeks -Do FeNO/exhaled nitric oxide test in the next few weeks -Continue current inhaler therapy -Spiriva and Symbicort as before  -We will readdress need for this at follow-up   -Do refill for this today -We will make decision on preoperative evaluation for the right shoulder based on above data - do not take macrobid for any UTI - please tell urologist  - will add this to allergy list - as contraindication   Shortness of breath - out of proportion to lung findings  -Likely drug due to weight gain, visceral obesity, loss of physical fitness and diastolic dysfunction which is stiff heart muscle  Plan  -Hold off on pulmonary stress test -Encourage reentry into physical activity and weight loss    Chronic cough - again out of proportion to lung fnmdings Could 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 [name] to review test results otherwise with nurse practitioner after completing above tests

## 2020-01-14 NOTE — Addendum Note (Signed)
Addended by: Vanessa Barbara on: 01/14/2020 02:11 PM   Modules accepted: Orders

## 2020-01-20 ENCOUNTER — Other Ambulatory Visit: Payer: Self-pay

## 2020-01-20 ENCOUNTER — Other Ambulatory Visit (INDEPENDENT_AMBULATORY_CARE_PROVIDER_SITE_OTHER): Payer: PPO | Admitting: Internal Medicine

## 2020-01-20 ENCOUNTER — Ambulatory Visit: Payer: PPO | Admitting: Internal Medicine

## 2020-01-20 DIAGNOSIS — R053 Chronic cough: Secondary | ICD-10-CM

## 2020-01-20 DIAGNOSIS — J449 Chronic obstructive pulmonary disease, unspecified: Secondary | ICD-10-CM | POA: Diagnosis not present

## 2020-01-20 LAB — POCT EXHALED NITRIC OXIDE: FeNO level (ppb): 35

## 2020-01-20 NOTE — Progress Notes (Signed)
Feno done today.Marland Kitchen

## 2020-01-29 ENCOUNTER — Other Ambulatory Visit: Payer: PPO

## 2020-01-30 ENCOUNTER — Other Ambulatory Visit: Payer: Self-pay

## 2020-01-30 ENCOUNTER — Ambulatory Visit: Payer: PPO | Admitting: Cardiology

## 2020-01-30 ENCOUNTER — Ambulatory Visit (INDEPENDENT_AMBULATORY_CARE_PROVIDER_SITE_OTHER)
Admission: RE | Admit: 2020-01-30 | Discharge: 2020-01-30 | Disposition: A | Discharge status: Home / Self Care | Payer: PPO | Source: Ambulatory Visit | Attending: Internal Medicine | Admitting: Internal Medicine

## 2020-01-30 VITALS — BP 110/64 | HR 69 | Ht 75.75 in | Wt 277.0 lb

## 2020-01-30 DIAGNOSIS — R0602 Shortness of breath: Secondary | ICD-10-CM | POA: Diagnosis not present

## 2020-01-30 DIAGNOSIS — R0789 Other chest pain: Secondary | ICD-10-CM | POA: Diagnosis not present

## 2020-01-30 DIAGNOSIS — I483 Typical atrial flutter: Secondary | ICD-10-CM | POA: Diagnosis not present

## 2020-01-30 DIAGNOSIS — J449 Chronic obstructive pulmonary disease, unspecified: Secondary | ICD-10-CM | POA: Diagnosis not present

## 2020-01-30 DIAGNOSIS — J479 Bronchiectasis, uncomplicated: Secondary | ICD-10-CM

## 2020-01-30 MED ORDER — APIXABAN 5 MG PO TABS: 5.0000 mg | ORAL_TABLET | Freq: Two times a day (BID) | ORAL | 0 refills | Status: DC

## 2020-01-30 NOTE — Progress Notes (Signed)
Electrophysiology Office Follow up Visit Note:    Date:  01/30/2020   ID:  Noah Huffman, DOB 01/09/1941, MRN [id number]  PCP:  [name], MD  [facility] Cardiologist:  No primary care provider on file.  [facility] Electrophysiologist:  Vickie Epley, MD    Interval History:    Noah Huffman is a 79 y.o. male who presents for a follow up visit. They were last seen in clinic November 25, 2019.  He underwent a successful typical atrial flutter ablation on January 02, 2020.  Since that time he is felt no palpitations.  He is under the care of his pulmonologist.  Recently he reported an atypical chest pain along the right chest for which his pulmonologist ordered a high-resolution CT scan.  The chest pain appears to be musculoskeletal given it occurs when he coughs or sneezes.  No correlation to exertion.    Past Medical History:  Diagnosis Date  . Allergy    seasonal  . Asthma   . Atrial fibrillation ([place])   . BPH (benign prostatic hypertrophy) with urinary obstruction   . Cataract    removed both eyes  . Diverticulosis   . GERD (gastroesophageal reflux disease)   . Hyperglycemia   . Hyperlipidemia   . Hypertension   . Inguinal hernia   . Neuromuscular disorder (HCC)    neuropathy in feet   . Obesity   . OSA (obstructive sleep apnea) 06/06/2013  . Osteoarthritis   . Sleep apnea    no cpap    Past Surgical History:  Procedure Laterality Date  . A-FLUTTER ABLATION N/A 01/02/2020   Procedure: A-FLUTTER ABLATION;  Surgeon: Vickie Epley, MD;  Location: [facility];  Service: Cardiovascular;  Laterality: N/A;  . APPENDECTOMY    . arthroscopic knee surgery    . CATARACT EXTRACTION  2012   bilateral  . COLONOSCOPY    . HAMMER TOE SURGERY    . INGUINAL HERNIA REPAIR     right  . POLYPECTOMY    . TONSILLECTOMY AND ADENOIDECTOMY    . TOTAL HIP ARTHROPLASTY  2007   right    Current Medications: Current Meds  Medication Sig  . albuterol  (VENTOLIN HFA) 108 (90 Base) MCG/ACT inhaler Inhale 2 puffs into the lungs every 4 (four) hours as needed for wheezing or shortness of breath.   Marland Kitchen apixaban (ELIQUIS) 5 MG TABS tablet Take 1 tablet (5 mg total) by mouth 2 (two) times daily.  . budesonide-formoterol (SYMBICORT) 160-4.5 MCG/ACT inhaler Inhale 2 puffs into the lungs 2 (two) times a day.  . cetirizine (ZYRTEC) 10 MG tablet Take 10 mg by mouth daily as needed for allergies.   Marland Kitchen gabapentin (NEURONTIN) 600 MG tablet Take 900 mg by mouth 3 (three) times daily.   Marland Kitchen losartan-hydrochlorothiazide (HYZAAR) 100-25 MG tablet Take 1 tablet by mouth daily.  . Multiple Vitamins-Minerals (PRESERVISION AREDS 2) CAPS Take 1 capsule by mouth daily.   . rosuvastatin (CRESTOR) 10 MG tablet Take 10 mg by mouth daily.   . Tiotropium Bromide Monohydrate (SPIRIVA RESPIMAT) 2.5 MCG/ACT AERS Inhale 2 puffs into the lungs daily.  . traMADol (ULTRAM) 50 MG tablet Take 50 mg by mouth every 6 (six) hours as needed.   Current Facility-Administered Medications for the 01/30/20 encounter (Office Visit) with Vickie Epley, MD  Medication  . 0.9 %  sodium chloride infusion     Allergies:   Macrobid [nitrofurantoin] and Morphine and related   Social History  Socioeconomic History  . Marital status: Married    Spouse name: Not on file  . Number of children: 2  . Years of education: Not on file  . Highest education level: Not on file  Occupational History  . Occupation: retired  Tobacco Use  . Smoking status: Former Smoker    Packs/day: 2.50    Years: 25.00    Pack years: 62.50    Types: Cigarettes    Quit date: 03/20/1988    Years since quitting: 31.8  . Smokeless tobacco: Never Used  Substance and Sexual Activity  . Alcohol use: Yes    Alcohol/week: 1.0 standard drink    Types: 1 Shots of liquor per week    Comment: 1-2 drinks per day or less   . Drug use: No  . Sexual activity: Not on file  Other Topics Concern  . Not on file  Social  History Narrative  . Not on file   Social Determinants of Health   Financial Resource Strain:   . Difficulty of Paying Living Expenses: Not on file  Food Insecurity:   . Worried About Charity fundraiser in the Last Year: Not on file  . Ran Out of Food in the Last Year: Not on file  Transportation Needs:   . Lack of Transportation (Medical): Not on file  . Lack of Transportation (Non-Medical): Not on file  Physical Activity:   . Days of Exercise per Week: Not on file  . Minutes of Exercise per Session: Not on file  Stress:   . Feeling of Stress : Not on file  Social Connections:   . Frequency of Communication with Friends and Family: Not on file  . Frequency of Social Gatherings with Friends and Family: Not on file  . Attends Religious Services: Not on file  . Active Member of Clubs or Organizations: Not on file  . Attends Archivist Meetings: Not on file  . Marital Status: Not on file     Family History: The patient's family history includes Brain cancer in his brother; Colon cancer (age of onset: 59) in his mother; Leukemia in his father; Pneumonia in his father; Pulmonary fibrosis in his brother; Transient ischemic attack in his father. There is no history of Colon polyps, Esophageal cancer, Rectal cancer, or Stomach cancer.  ROS:   Please see the history of present illness.    All other systems reviewed and are negative.  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  Obese HEENT: Normal NECK: No JVD; No carotid bruits LYMPHATICS: No lymphadenopathy CARDIAC: RRR, no murmurs, rubs, gallops RESPIRATORY:  Clear to auscultation without rales, wheezing or rhonchi  ABDOMEN: Soft, non-tender,  non-distended MUSCULOSKELETAL:  No edema; No deformity  SKIN: Warm and dry NEUROLOGIC:  Alert and oriented x 3 PSYCHIATRIC:  Normal affect   ASSESSMENT:    1. Typical atrial flutter (HCC)    PLAN:    In order of problems listed above:  1. Typical atrial flutter Successfully ablated on January 02, 2020.  No recurrence of arrhythmia.  Symptoms of atrial fibrillation but would like to avoid long-term anticoagulant use if at all possible.  We will plan on implanting a loop recorder to monitor for atrial fibrillation and discontinue Eliquis after loop is implanted.  Hold Eliquis 2 days prior to loop recorder implant. The procedural details and risks of loop recorder were discussed with the patient in detail and he is agreeable to proceed.  2.  Atypical chest pain Right-sided chest pain that seems to be musculoskeletal in origin given its occurrence with sneezing and coughing fits.  He does have some coronary calcium on his recent CT scan earlier today but no symptoms of ischemia during my visit with him today.  Plan to review the results of today's CT scan at our next visit during the loop recorder implant.  3.  COPD Likely contributing to #2.   Medication Adjustments/Labs and Tests Ordered: Current medicines are reviewed at length with the patient today.  Concerns regarding medicines are outlined above.  Orders Placed This Encounter  Procedures  . EKG 12-Lead   No orders of the defined types were placed in this encounter.    Signed, Lars Mage, MD,  [facility]  01/30/2020 11:20 AM    Electrophysiology [place]

## 2020-01-30 NOTE — Patient Instructions (Addendum)
Medication Instructions:  Your physician has recommended you make the following change in your medication:   1.  STOP your Eliquis on February 06, 2020-take your PM dose then stop  Lab Work: None ordered. If you have labs (blood work) drawn today and your tests are completely normal, you will receive your results only by: Marland Kitchen MyChart Message (if you have MyChart) OR . A paper copy in the mail If you have any lab test that is abnormal or we need to change your treatment, we will call you to review the results.  Testing/Procedures: None ordered.  Follow-Up: At [facility], you and your health needs are our priority.  As part of our continuing mission to provide you with exceptional heart care, we have created designated Provider Care Teams.  These Care Teams include your primary Cardiologist (physician) and Advanced Practice Providers (APPs -  Physician Assistants and Nurse Practitioners) who all work together to provide you with the care you need, when you need it.  We recommend signing up for the patient portal called "MyChart".  Sign up information is provided on this After Visit Summary.  MyChart is used to connect with patients for Virtual Visits (Telemedicine).  Patients are able to view lab/test results, encounter notes, upcoming appointments, etc.  Non-urgent messages can be sent to your provider as well.   To learn more about what you can do with MyChart, go to [email or web address].    Your next appointment:    February 09, 2020 at 9:15 am with Dr. Quentin Ore for a loop recorder   Gracy Bruins- [phone]  Implantable loop recorder

## 2020-02-02 DIAGNOSIS — R3915 Urgency of urination: Secondary | ICD-10-CM | POA: Diagnosis not present

## 2020-02-02 DIAGNOSIS — N401 Enlarged prostate with lower urinary tract symptoms: Secondary | ICD-10-CM | POA: Diagnosis not present

## 2020-02-02 DIAGNOSIS — R8279 Other abnormal findings on microbiological examination of urine: Secondary | ICD-10-CM | POA: Diagnosis not present

## 2020-02-02 DIAGNOSIS — R351 Nocturia: Secondary | ICD-10-CM | POA: Diagnosis not present

## 2020-02-02 NOTE — Progress Notes (Signed)
CT without evidence of ILD but jus old bland scar. He was supposed to have other workp like PFT and see myself or APP but do not see followup. Please ensure

## 2020-02-03 ENCOUNTER — Telehealth: Payer: Self-pay | Admitting: Internal Medicine

## 2020-02-03 DIAGNOSIS — J449 Chronic obstructive pulmonary disease, unspecified: Secondary | ICD-10-CM

## 2020-02-03 NOTE — Patient Instructions (Signed)
feno

## 2020-02-03 NOTE — Telephone Encounter (Signed)
Info from recent CT scan: Amy Charlett Lango  02/02/2020 5:00 PM EST Back to Top    Did you want PFTs to be done? It wasn't in your last OV note and patient does not remember you mentioning this.   Brand Males, MD  02/02/2020 6:10 AM EST     CT without evidence of ILD but jus old bland scar. He was supposed to have other workp like PFT and see myself or APP but do not see followup. Please ensure    MR, please advise on message from Amy.

## 2020-02-06 NOTE — Telephone Encounter (Signed)
You are correct. I only wanted feno which is now borderline. Last PFT nov 2020. So apologize for confusion. Revised plan - do spiromtry with and without BD and dlco and return for followup but ensure other instructions if any from prior ov are completed  Thanks

## 2020-02-06 NOTE — Telephone Encounter (Signed)
Attempted to call pt but unable to reach.left message for him to return call.  When pt returns call, please schedule pt 1hr PFT appt followed by OV with either MR or APP. Thank you!

## 2020-02-09 ENCOUNTER — Other Ambulatory Visit: Payer: Self-pay

## 2020-02-09 ENCOUNTER — Ambulatory Visit: Payer: PPO | Admitting: Cardiology

## 2020-02-09 ENCOUNTER — Encounter: Payer: Self-pay | Admitting: Cardiology

## 2020-02-09 VITALS — BP 144/70 | HR 56 | Ht 76.0 in | Wt 277.4 lb

## 2020-02-09 DIAGNOSIS — J449 Chronic obstructive pulmonary disease, unspecified: Secondary | ICD-10-CM

## 2020-02-09 DIAGNOSIS — I10 Essential (primary) hypertension: Secondary | ICD-10-CM

## 2020-02-09 DIAGNOSIS — I483 Typical atrial flutter: Secondary | ICD-10-CM

## 2020-02-09 NOTE — Patient Instructions (Addendum)
Medication Instructions:  Your physician recommends that you continue on your current medications as directed. Please refer to the Current Medication list given to you today.  Labwork: None ordered.  Testing/Procedures: None ordered.  Follow-Up:  Your physician wants you to follow-up in: 3 months with Dr. Quentin Ore.    May 25, 2020 at 2:00 pm at the [facility] office    Implantable Loop Recorder Placement, Care After This sheet gives you information about how to care for yourself after your procedure. Your health care provider may also give you more specific instructions. If you have problems or questions, contact your health care provider. What can I expect after the procedure? After the procedure, it is common to have:  Soreness or discomfort near the incision.  Some swelling or bruising near the incision.  Follow these instructions at home: Incision care  1.  Leave your outer dressing on for 72 hours.  After 72 hours you can remove your outer dressing and shower. 2. Leave adhesive strips in place. These skin closures may need to stay in place for 1-2 weeks. If adhesive strip edges start to loosen and curl up, you may trim the loose edges.  You may remove the strips if they have not fallen off after 2 weeks. 3. Check your incision area every day for signs of infection. Check for: a. Redness, swelling, or pain. b. Fluid or blood. c. Warmth. d. Pus or a bad smell. 4. Do not take baths, swim, or use a hot tub until your incision is completely healed. 5. If your wound site starts to bleed apply pressure.      If you have any questions/concerns please call the device clinic at [phone].  Activity  Return to your normal activities.  General instructions  Follow instructions from your health care provider about how to manage your implantable loop recorder and transmit the information. Learn how to activate a recording if this is necessary for your type of device.  Do not  go through a metal detection gate, and do not let someone hold a metal detector over your chest. Show your ID card.  Do not have an MRI unless you check with your health care provider first.  Take over-the-counter and prescription medicines only as told by your health care provider.  Keep all follow-up visits as told by your health care provider. This is important. Contact a health care provider if:  You have redness, swelling, or pain around your incision.  You have a fever.  You have pain that is not relieved by your pain medicine.  You have triggered your device because of fainting (syncope) or because of a heartbeat that feels like it is racing, slow, fluttering, or skipping (palpitations). Get help right away if you have:  Chest pain.  Difficulty breathing. Summary  After the procedure, it is common to have soreness or discomfort near the incision.  Change your dressing as told by your health care provider.  Follow instructions from your health care provider about how to manage your implantable loop recorder and transmit the information.  Keep all follow-up visits as told by your health care provider. This is important. This information is not intended to replace advice given to you by your health care provider. Make sure you discuss any questions you have with your health care provider. Document Released: 02/15/2015 Document Revised: 04/21/2017 Document Reviewed: 04/21/2017 Elsevier Patient Education  2020 [employer].

## 2020-02-09 NOTE — Progress Notes (Signed)
Electrophysiology Office Follow up Visit Note:    Date:  02/09/2020   ID:  Noah Huffman, DOB 04-05-1940, MRN [id number]  PCP:  [name], MD  [facility] Cardiologist:  No primary care provider on file.  [facility] Electrophysiologist:  Vickie Epley, MD    Interval History:    Noah Huffman is a 79 y.o. male who presents for a follow up visit after successful atrial flutter ablation on January 02, 2020.  He has had no recurrence of atrial flutter and no prior evidence for atrial fibrillation.  He presents today for loop recorder implantation.  Past Medical History:  Diagnosis Date  . Allergy    seasonal  . Asthma   . Atrial fibrillation ([place])   . BPH (benign prostatic hypertrophy) with urinary obstruction   . Cataract    removed both eyes  . Diverticulosis   . GERD (gastroesophageal reflux disease)   . Hyperglycemia   . Hyperlipidemia   . Hypertension   . Inguinal hernia   . Neuromuscular disorder (HCC)    neuropathy in feet   . Obesity   . OSA (obstructive sleep apnea) 06/06/2013  . Osteoarthritis   . Sleep apnea    no cpap    Past Surgical History:  Procedure Laterality Date  . A-FLUTTER ABLATION N/A 01/02/2020   Procedure: A-FLUTTER ABLATION;  Surgeon: Vickie Epley, MD;  Location: [facility];  Service: Cardiovascular;  Laterality: N/A;  . APPENDECTOMY    . arthroscopic knee surgery    . CATARACT EXTRACTION  2012   bilateral  . COLONOSCOPY    . HAMMER TOE SURGERY    . INGUINAL HERNIA REPAIR     right  . POLYPECTOMY    . TONSILLECTOMY AND ADENOIDECTOMY    . TOTAL HIP ARTHROPLASTY  2007   right    Current Medications: Current Meds  Medication Sig  . albuterol (VENTOLIN HFA) 108 (90 Base) MCG/ACT inhaler Inhale 2 puffs into the lungs every 4 (four) hours as needed for wheezing or shortness of breath.   . budesonide-formoterol (SYMBICORT) 160-4.5 MCG/ACT inhaler Inhale 2 puffs into the lungs 2 (two) times a day.  . cetirizine  (ZYRTEC) 10 MG tablet Take 10 mg by mouth daily as needed for allergies.   . ciprofloxacin (CIPRO) 500 MG tablet Take 500 mg by mouth 2 (two) times daily.  Marland Kitchen gabapentin (NEURONTIN) 600 MG tablet Take 900 mg by mouth 3 (three) times daily.   Marland Kitchen losartan-hydrochlorothiazide (HYZAAR) 100-25 MG tablet Take 1 tablet by mouth daily.  . Multiple Vitamins-Minerals (PRESERVISION AREDS 2) CAPS Take 1 capsule by mouth daily.   . rosuvastatin (CRESTOR) 10 MG tablet Take 10 mg by mouth daily.   . tamsulosin (FLOMAX) 0.4 MG CAPS capsule Take 0.4 mg by mouth daily.  . Tiotropium Bromide Monohydrate (SPIRIVA RESPIMAT) 2.5 MCG/ACT AERS Inhale 2 puffs into the lungs daily.  . traMADol (ULTRAM) 50 MG tablet Take 50 mg by mouth every 6 (six) hours as needed.  . [DISCONTINUED] apixaban (ELIQUIS) 5 MG TABS tablet Take 1 tablet (5 mg total) by mouth 2 (two) times daily for 7 days.   Current Facility-Administered Medications for the 02/09/20 encounter (Office Visit) with Vickie Epley, MD  Medication  . 0.9 %  sodium chloride infusion     Allergies:   Macrobid [nitrofurantoin] and Morphine and related   Social History   Socioeconomic History  . Marital status: Married    Spouse name: Not on file  .  Number of children: 2  . Years of education: Not on file  . Highest education level: Not on file  Occupational History  . Occupation: retired  Tobacco Use  . Smoking status: Former Smoker    Packs/day: 2.50    Years: 25.00    Pack years: 62.50    Types: Cigarettes    Quit date: 03/20/1988    Years since quitting: 31.9  . Smokeless tobacco: Never Used  Substance and Sexual Activity  . Alcohol use: Yes    Alcohol/week: 1.0 standard drink    Types: 1 Shots of liquor per week    Comment: 1-2 drinks per day or less   . Drug use: No  . Sexual activity: Not on file  Other Topics Concern  . Not on file  Social History Narrative  . Not on file   Social Determinants of Health   Financial Resource  Strain:   . Difficulty of Paying Living Expenses: Not on file  Food Insecurity:   . Worried About Charity fundraiser in the Last Year: Not on file  . Ran Out of Food in the Last Year: Not on file  Transportation Needs:   . Lack of Transportation (Medical): Not on file  . Lack of Transportation (Non-Medical): Not on file  Physical Activity:   . Days of Exercise per Week: Not on file  . Minutes of Exercise per Session: Not on file  Stress:   . Feeling of Stress : Not on file  Social Connections:   . Frequency of Communication with Friends and Family: Not on file  . Frequency of Social Gatherings with Friends and Family: Not on file  . Attends Religious Services: Not on file  . Active Member of Clubs or Organizations: Not on file  . Attends Archivist Meetings: Not on file  . Marital Status: Not on file     Family History: The patient's family history includes Brain cancer in his brother; Colon cancer (age of onset: 60) in his mother; Leukemia in his father; Pneumonia in his father; Pulmonary fibrosis in his brother; Transient ischemic attack in his father. There is no history of Colon polyps, Esophageal cancer, Rectal cancer, or Stomach cancer.  ROS:   Please see the history of present illness.    All other systems reviewed and are negative.  EKGs/Labs/Other Studies Reviewed:    The following studies were reviewed today: Prior notes   Recent Labs: 11/30/2019: ALT 15 12/31/2019: BUN 21; Creatinine, Ser 0.75; Hemoglobin 14.1; Platelets 204; Sodium 140 01/02/2020: Potassium 3.3  Recent Lipid Panel No results found for: CHOL, TRIG, HDL, CHOLHDL, VLDL, LDLCALC, LDLDIRECT  Physical Exam:    VS:  BP (!) 144/70   Pulse (!) 56   Ht 6\' 4"  (1.93 m)   Wt 277 lb 6.4 oz (125.8 kg)   SpO2 97%   BMI 33.77 kg/m     Wt Readings from Last 3 Encounters:  02/09/20 277 lb 6.4 oz (125.8 kg)  01/30/20 277 lb (125.6 kg)  01/14/20 276 lb 3.2 oz (125.3 kg)     GEN:  Well  nourished, well developed in no acute distress HEENT: Normal NECK: No JVD; No carotid bruits LYMPHATICS: No lymphadenopathy CARDIAC: RRR, no murmurs, rubs, gallops RESPIRATORY:  Clear to auscultation without rales, wheezing or rhonchi  ABDOMEN: Soft, non-tender, non-distended MUSCULOSKELETAL:  No edema; No deformity  SKIN: Warm and dry NEUROLOGIC:  Alert and oriented x 3 PSYCHIATRIC:  Normal affect   ASSESSMENT:  1. Typical atrial flutter ([place])   2. Chronic obstructive pulmonary disease, unspecified COPD type ([place])   3. Essential hypertension    PLAN:    In order of problems listed above:  1. Typical atrial flutter Post successful ablation on January 02, 2020.  He had no recurrence of atrial arrhythmias and no prior evidence for atrial fibrillation.  Given no history of atrial fibrillation and no ongoing indication for oral anticoagulation we have discussed discontinuing his oral anticoagulant and implanting a loop recorder for ongoing surveillance of atrial arrhythmias.  Risks of the procedure discussed in detail with the patient and he wishes to proceed.    Medication Adjustments/Labs and Tests Ordered: Current medicines are reviewed at length with the patient today.  Concerns regarding medicines are outlined above.  No orders of the defined types were placed in this encounter.  No orders of the defined types were placed in this encounter.    Signed, Lars Mage, MD, [facility]  02/09/2020 9:57 AM    Electrophysiology [facility]         ----------------------------------------------------------------------------------------------------------- SURGEON:  Lars Mage, MD    PREPROCEDURE DIAGNOSIS: Atrial flutter    POSTPROCEDURE DIAGNOSIS: Atrial flutter     PROCEDURES:   1. Implantable loop recorder implantation    INTRODUCTION:  Noah Huffman is a 79 y.o. male with a typical atrial flutter post ablation on January 02, 2020.  He  has no prior history of atrial fibrillation and desires a strategy to avoid long-term use of oral anticoagulation.  Therefore he presents to clinic for loop recorder implantation for ongoing surveillance of atrial fibrillation.    DESCRIPTION OF PROCEDURE:  Informed written consent was obtained.  The patient required no sedation for the procedure today.  Mapping over the patient's chest was performed to identify the area where electrograms were most prominent for ILR recording.  This area was found to be the left parasternal region over the 3rd-4th intercostal space. The patients left chest was therefore prepped and draped in the usual sterile fashion. The skin overlying the left parasternal region was infiltrated with lidocaine for local analgesia.  A 0.5-cm incision was made over the left parasternal region over the 3rd intercostal space.  A subcutaneous ILR pocket was fashioned using a combination of sharp and blunt dissection.  A Medtronic Reveal Linq model [id number] (serial number [id number]) implantable loop recorder was then placed into the pocket  R waves were very prominent and measured >0.13mV.  Steri- Strips and a sterile dressing were then applied.  There were no early apparent complications.     CONCLUSIONS:   1. Successful implantation of a Medtronic Reveal LINQ implantable loop recorder for typical atrial flutter.  2. No early apparent complications.   Lars Mage, MD 02/09/2020 9:57 AM

## 2020-02-10 DIAGNOSIS — M5459 Other low back pain: Secondary | ICD-10-CM | POA: Diagnosis not present

## 2020-02-13 NOTE — Telephone Encounter (Signed)
Pt is scheduled for PFT 12/27. Nothing further needed.

## 2020-02-23 ENCOUNTER — Other Ambulatory Visit: Payer: Self-pay | Admitting: Internal Medicine

## 2020-02-27 DIAGNOSIS — H16202 Unspecified keratoconjunctivitis, left eye: Secondary | ICD-10-CM | POA: Diagnosis not present

## 2020-02-27 DIAGNOSIS — H04123 Dry eye syndrome of bilateral lacrimal glands: Secondary | ICD-10-CM | POA: Diagnosis not present

## 2020-03-15 ENCOUNTER — Ambulatory Visit (INDEPENDENT_AMBULATORY_CARE_PROVIDER_SITE_OTHER): Payer: PPO | Admitting: Internal Medicine

## 2020-03-15 ENCOUNTER — Ambulatory Visit: Payer: PPO | Admitting: Pulmonary Disease

## 2020-03-15 ENCOUNTER — Ambulatory Visit (INDEPENDENT_AMBULATORY_CARE_PROVIDER_SITE_OTHER): Payer: PPO

## 2020-03-15 ENCOUNTER — Other Ambulatory Visit: Payer: Self-pay | Admitting: Pulmonary Disease

## 2020-03-15 ENCOUNTER — Encounter: Payer: Self-pay | Admitting: Pulmonary Disease

## 2020-03-15 ENCOUNTER — Other Ambulatory Visit: Payer: Self-pay

## 2020-03-15 VITALS — BP 122/62 | HR 62 | Temp 97.3°F | Ht 77.0 in | Wt 284.0 lb

## 2020-03-15 DIAGNOSIS — J449 Chronic obstructive pulmonary disease, unspecified: Secondary | ICD-10-CM | POA: Diagnosis not present

## 2020-03-15 DIAGNOSIS — R059 Cough, unspecified: Secondary | ICD-10-CM

## 2020-03-15 DIAGNOSIS — G8929 Other chronic pain: Secondary | ICD-10-CM

## 2020-03-15 DIAGNOSIS — M25511 Pain in right shoulder: Secondary | ICD-10-CM | POA: Insufficient documentation

## 2020-03-15 DIAGNOSIS — R0789 Other chest pain: Secondary | ICD-10-CM | POA: Diagnosis not present

## 2020-03-15 DIAGNOSIS — B370 Candidal stomatitis: Secondary | ICD-10-CM

## 2020-03-15 DIAGNOSIS — J479 Bronchiectasis, uncomplicated: Secondary | ICD-10-CM | POA: Diagnosis not present

## 2020-03-15 DIAGNOSIS — I483 Typical atrial flutter: Secondary | ICD-10-CM | POA: Diagnosis not present

## 2020-03-15 LAB — CUP PACEART REMOTE DEVICE CHECK
Date Time Interrogation Session: 20211226211817
Implantable Pulse Generator Implant Date: 20211122

## 2020-03-15 LAB — PULMONARY FUNCTION TEST
DL/VA % pred: 108 %
DL/VA: 4.15 ml/min/mmHg/L
DLCO cor % pred: 98 %
DLCO cor: 28.29 ml/min/mmHg
DLCO unc % pred: 98 %
DLCO unc: 28.29 ml/min/mmHg
FEF 25-75 Post: 1.53 L/sec
FEF 25-75 Pre: 1.71 L/sec
FEF2575-%Change-Post: -10 %
FEF2575-%Pred-Post: 59 %
FEF2575-%Pred-Pre: 66 %
FEV1-%Change-Post: -2 %
FEV1-%Pred-Post: 68 %
FEV1-%Pred-Pre: 69 %
FEV1-Post: 2.45 L
FEV1-Pre: 2.51 L
FEV1FVC-%Change-Post: 0 %
FEV1FVC-%Pred-Pre: 101 %
FEV6-%Change-Post: -2 %
FEV6-%Pred-Post: 71 %
FEV6-%Pred-Pre: 73 %
FEV6-Post: 3.37 L
FEV6-Pre: 3.44 L
FEV6FVC-%Pred-Post: 106 %
FEV6FVC-%Pred-Pre: 106 %
FVC-%Change-Post: -2 %
FVC-%Pred-Post: 67 %
FVC-%Pred-Pre: 69 %
FVC-Post: 3.37 L
FVC-Pre: 3.44 L
Post FEV1/FVC ratio: 73 %
Post FEV6/FVC ratio: 100 %
Pre FEV1/FVC ratio: 73 %
Pre FEV6/FVC Ratio: 100 %
RV % pred: 104 %
RV: 3.02 L
TLC % pred: 89 %
TLC: 7.2 L

## 2020-03-15 MED ORDER — NYSTATIN 100000 UNIT/ML MT SUSP: 5.0000 mL | Freq: Four times a day (QID) | OROMUCOSAL | 0 refills | Status: DC

## 2020-03-15 NOTE — Patient Instructions (Addendum)
You were seen today by Noah Ceo, NP  for:   1. COPD GOLD II  Nystatin 500,000 units suspension /100,000 units/mL >>>5 mL's every 6 hours for 7 days >>>Try to retain nystatin in mouth as long as possible  Continue Symbicort 160 >>> 2 puffs in the morning right when you wake up, rinse out your mouth after use, 12 hours later 2 puffs, rinse after use >>> Take this daily, no matter what >>> This is not a rescue inhaler   Spiriva Respimat 2.5 >>> 2 puffs daily >>> Do this every day >>>This is not a rescue inhaler  Note your daily symptoms > remember "red flags" for COPD:   >>>Increase in cough >>>increase in sputum production >>>increase in shortness of breath or activity  intolerance.   If you notice these symptoms, please call the office to be seen.     2. Bronchiectasis without complication (HCC)  Bronchiectasis: This is the medical term which indicates that you have damage, dilated airways making you more susceptible to respiratory infection. Use a flutter valve 10 breaths twice a day or 4 to 5 breaths 4-5 times a day to help clear mucus out Let us know if you have cough with change in mucus color or fevers or chills.  At that point you would need an antibiotic. Maintain a healthy nutritious diet, eating whole foods Take your medications as prescribed   3. Cough  Please start taking a daily antihistamine:  >>>choose one of: zyrtec, claritin, allegra, or xyzal  >>>these are over the counter medications  >>>can choose generic option  >>>take daily  >>>this medication helps with allergies, post nasal drip, and cough   Start nasal saline rinses twice daily Use distilled water Shake well Get bottle lukewarm like a baby bottle  Continue flonase as needed   4. Right-sided chest wall pain  Discuss atypical pain with cardiology   5. Chronic right shoulder pain  Complete shoulder surgery as planned    Follow Up:    Return in about 3 months (around 06/13/2020),  or if symptoms worsen or fail to improve, for Follow up with Dr. Dalbert Mayotte.   Notification of test results are managed in the following manner: If there are  any recommendations or changes to the  plan of care discussed in office today,  we will contact you and let you know what they are. If you do not hear from [place], then your results are normal and you can view them through your  MyChart account , or a letter will be sent to you. Thank you again for trusting [place] with your care  - Thank you, [facility]    It is flu season:   >>> Best ways to protect herself from the flu: Receive the yearly flu vaccine, practice good hand hygiene washing with soap and also using hand sanitizer when available, eat a nutritious meals, get adequate rest, hydrate appropriately       Please contact the office if your symptoms worsen or you have concerns that you are not improving.   Thank you for choosing [facility] for your healthcare, and for allowing [place] to partner with you on your healthcare journey. I am thankful to be able to provide care to you today.   Noah Headland FNP-C    Bronchiectasis  Bronchiectasis is a condition in which the airways in the lungs (bronchi) are damaged and widened. The condition makes it hard for the lungs to get rid of mucus, and  it causes mucus to gather in the bronchi. This condition often leads to lung infections, which can make the condition worse. What are the causes? You can be born with this condition or you can develop it later in life. Common causes of this condition include:  Cystic fibrosis.  Repeated lung infections, such as pneumonia or tuberculosis.  An object or other blockage in the lungs.  Breathing in fluid, food, or other objects (aspiration).  A problem with the immune system and lung structure that is present at birth (congenital). Sometimes the cause is not known. What are the signs or symptoms? Common symptoms of this condition  include:  A daily cough that brings up mucus and lasts for more than 3 weeks.  Lung infections that happen often.  Shortness of breath and wheezing.  Weakness and fatigue. How is this diagnosed? This condition is diagnosed with tests, such as:  Chest X-rays or CT scans. These are done to check for changes in the lungs.  Breathing tests. These are done to check how well your lungs are working.  A test of a sample of your saliva (sputum culture). This test is done to check for infection.  Blood tests and other tests. These are done to check for related diseases or causes. How is this treated? Treatment for this condition depends on the severity of the illness and its cause. Treatment may include:  Medicines that loosen mucus so it can be coughed up (expectorants).  Medicines that relax the muscles of the bronchi (bronchodilators).  Antibiotic medicines to prevent or treat infection.  Physical therapy to help clear mucus from the lungs. Techniques may include: ? Postural drainage. This is when you sit or lie in certain positions so that mucus can drain by gravity. ? Chest percussion. This involves tapping the chest or back with a cupped hand. ? Chest vibration. For this therapy, a hand or special equipment vibrates your chest and back.  Surgery to remove the affected part of the lung. This may be done in severe cases. Follow these instructions at home: Medicines  Take over-the-counter and prescription medicines only as told by your health care provider.  If you were prescribed an antibiotic medicine, take it as told by your health care provider. Do not stop taking the antibiotic even if you start to feel better.  Avoid taking sedatives and antihistamines unless your health care provider tells you to take them. These medicines tend to thicken the mucus in the lungs. Managing symptoms  Perform breathing exercises or techniques to clear your lungs as told by your health care  provider.  Consider using a cold steam vaporizer or humidifier in your room or home to help loosen secretions.  If you have a cough that gets worse at night, try sleeping in a semi-upright position. General instructions  Get plenty of rest.  Drink enough fluid to keep your urine clear or pale yellow.  Stay inside when pollution and ozone levels are high.  Stay up to date with vaccinations and immunizations.  Avoid cigarette smoke and other lung irritants.  Do not use any products that contain nicotine or tobacco, such as cigarettes and e-cigarettes. If you need help quitting, ask your health care provider.  Keep all follow-up visits as told by your health care provider. This is important. Contact a health care provider if:  You cough up more sputum than before and the sputum is yellow or green in color.  You have a fever.  You cannot control  your cough and are losing sleep. Get help right away if:  You cough up blood.  You have chest pain.  You have increasing shortness of breath.  You have pain that gets worse or is not controlled with medicines.  You have a fever and your symptoms suddenly get worse. Summary  Bronchiectasis is a condition in which the airways in the lungs (bronchi) are damaged and widened. The condition makes it hard for the lungs to get rid of mucus, and it causes mucus to gather in the bronchi.  Treatment usually includes therapy to help clear mucus from the lungs.  Stay up to date with vaccinations and immunizations. This information is not intended to replace advice given to you by your health care provider. Make sure you discuss any questions you have with your health care provider. Document Revised: 02/16/2017 Document Reviewed: 04/10/2016 Elsevier Patient Education  2020 [employer].

## 2020-03-15 NOTE — Assessment & Plan Note (Signed)
Nasal congestion on physical exam Postnasal drip on physical exam Not currently on a daily antihistamine Occasionally uses Flonase Has used nasal saline rinses in the past and found relief Known bronchiectasis  Plan: Resume daily antihistamine Start nasal saline rinses Can use Flonase after nasal saline rinse Start flutter valve

## 2020-03-15 NOTE — Assessment & Plan Note (Signed)
Suspect that right-sided chest wall pain is directly related to pending right shoulder surgery Do not see a pulmonary component in this Would recommend patient discuss this with cardiology as well

## 2020-03-15 NOTE — Assessment & Plan Note (Signed)
Plan: Start flutter valve, provided today Reviewed bronchiectasis today

## 2020-03-15 NOTE — Assessment & Plan Note (Signed)
Reviewed pulmonary function testing today Reviewed 2020 PFT as well  Plan: Nystatin today for oral thrush Continue Symbicort 160 Continue Spiriva Respimat 2.5 Reviewed with patient that he needs to rinse mouth out after using inhalers Start flutter valve Okay to proceed forward with purchasing air physiodevice if patient would like

## 2020-03-15 NOTE — Progress Notes (Signed)
Full PFT performed today. °

## 2020-03-15 NOTE — Progress Notes (Signed)
@Patient  ID: , male    DOB: [birth year], 79 y.o.   MRN: [id number]  Chief Complaint  Patient presents with  . Follow-up    COPD, still having chest pains with wheezing.    Referring provider: [id number], MD  HPI:  79 year old male former smoker followed in our office for COPD  PMH: Hypertension, history of atrial flutter Smoker/ Smoking History: Former smoker.  Quit 1990.  62.5-pack-year smoking history Maintenance: Symbicort 160, Spiriva Respimat 2.5 Pt of: Dr. 70  03/15/2020  - Visit   79 year old male former smoker followed in our office for COPD.  He is followed by Dr. 70.  Patient was last seen by Dr. Marchelle Gearing in October/2021.  Patient was found to have bronchiectasis is encouraged to remain on his inhalers.  Shortness of breath was felt to be related to weight gain.  He was encouraged to follow-up with the ENT Dr. November/2021 for chronic cough.  Patient completing pulmonary function testing today.  Those results are listed below:  03/15/2020-pulmonary function test-FVC 3.44 (69% predicted), ratio 73, postbronchodilator FEV1 2.45 (68% predicted), TLC 7.20 (89% predicted), DLCO 28.29 (98% retention)  Patient continues to report ongoing atypical chest pain.  He also has pending right shoulder surgery.  He plans on having this completed in January/2022.  We will discuss this today.  Patient has not followed up with ENT Dr. February/2022.   Patient does not utilize a flutter valve.  He is interested in getting air physiodevice.  We will discuss this today.  He is considering getting a second opinion for pulmonary needs.  We will discuss this today.  Questionaires / Pulmonary Flowsheets:   ACT:  No flowsheet data found.  MMRC: No flowsheet data found.  Epworth:  No flowsheet data found.  Tests:   03/15/2020-pulmonary function test-FVC 3.44 (69% predicted), ratio 73, postbronchodilator FEV1 2.45 (68% predicted), TLC 7.20 (89% predicted), DLCO 28.29 (98%  retention)  01/30/2020-CT chest high-res-no significant change in mild bland appearing scarring and volume loss left greater than right bases, no specific evidence of fibrotic interstitial lung disease, mild diffuse bilateral bronchial wall thickening consistent with months specific infectious or inflammatory bronchitis, CAD  FENO:  Lab Results  Component Value Date   NITRICOXIDE 22 08/24/2017    PFT: PFT Results Latest Ref Rng & Units 03/15/2020 01/21/2019 10/08/2017  FVC-Pre L 3.44 3.73 4.03  FVC-Predicted Pre % 69 74 79  FVC-Post L 3.37 3.69 3.68  FVC-Predicted Post % 67 73 72  Pre FEV1/FVC % % 73 64 71  Post FEV1/FCV % % 73 70 72  FEV1-Pre L 2.51 2.40 2.85  FEV1-Predicted Pre % 69 65 77  FEV1-Post L 2.45 2.58 2.63  DLCO uncorrected ml/min/mmHg 28.29 25.55 28.61  DLCO UNC% % 98 88 73  DLCO corrected ml/min/mmHg 28.29 - -  DLCO COR %Predicted % 98 - -  DLVA Predicted % 108 108 83  TLC L 7.20 7.91 6.82  TLC % Predicted % 89 98 84  RV % Predicted % 104 139 91    WALK:  SIX MIN WALK 08/24/2017  Supplimental Oxygen during Test? (L/min) No  Tech Comments: Pt walked at a normal pace completing all required laps. Pt had mild SOB.    Imaging: CUP PACEART REMOTE DEVICE CHECK  Result Date: 03/15/2020 ILR summary report received. Battery status OK. Normal device function. No new symptom, brady, or pause episodes. No new AF episodes. 2 tachy episodes, narrow complex, sudden onset, occurs while active.  Monthly summary  reports and ROV/PRN   Lab Results:  CBC    Component Value Date/Time   WBC 8.3 12/31/2019 1004   WBC 13.2 (H) 11/30/2019 0927   RBC 4.62 12/31/2019 1004   RBC 4.55 11/30/2019 0927   HGB 14.1 12/31/2019 1004   HCT 41.9 12/31/2019 1004   PLT 204 12/31/2019 1004   MCV 91 12/31/2019 1004   MCH 30.5 12/31/2019 1004   MCH 30.8 11/30/2019 0927   MCHC 33.7 12/31/2019 1004   MCHC 33.8 11/30/2019 0927   RDW 14.6 12/31/2019 1004   LYMPHSABS 2.3 12/31/2019 1004    MONOABS 0.5 11/30/2019 0927   EOSABS 0.4 12/31/2019 1004   BASOSABS 0.0 12/31/2019 1004    BMET    Component Value Date/Time   NA 140 12/31/2019 1004   K 3.3 (L) 01/02/2020 0530   CL 102 12/31/2019 1004   CO2 31 (H) 12/31/2019 1004   GLUCOSE 121 (H) 12/31/2019 1004   GLUCOSE 165 (H) 11/30/2019 0927   BUN 21 12/31/2019 1004   CREATININE 0.75 (L) 12/31/2019 1004   CALCIUM 9.1 12/31/2019 1004   GFRNONAA 88 12/31/2019 1004   GFRAA 102 12/31/2019 1004    BNP No results found for: BNP  ProBNP No results found for: PROBNP  Specialty Problems      Pulmonary Problems   Cough    Followed in Pulmonary clinic/ [facility]/ [place]  - cxr 10/01/12 radiology read as RLL atx but not felt to represent sign change since 07/2011   Much better off acei, min am cough.  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 Macrobid [Nitrofurantoin]   . Morphine And Related Rash    Immunization History  Administered Date(s) Administered  . Fluad Quad(high Dose 65+) 12/02/2018, 12/24/2019  . Influenza,inj,Quad PF,6+ Mos 01/14/2017  . Influenza,inj,quad, With Preservative 12/11/2018  . PFIZER SARS-COV-2 Vaccination 05/15/2019, 06/10/2019  . Unspecified SARS-COV-2 Vaccination 05/21/2019    Past Medical History:  Diagnosis Date  . Allergy    seasonal  . Asthma   . Atrial fibrillation ([place])   . BPH (benign prostatic hypertrophy) with urinary obstruction   . Cataract    removed both eyes  . Diverticulosis   . GERD (gastroesophageal reflux disease)   . Hyperglycemia   . Hyperlipidemia   . Hypertension   . Inguinal hernia   . Neuromuscular disorder  (HCC)    neuropathy in feet   . Obesity   . OSA (obstructive sleep apnea) 06/06/2013  . Osteoarthritis   . Sleep apnea    no cpap    Tobacco History: Social History   Tobacco Use  Smoking Status Former Smoker  . Packs/day: 2.50  . Years: 25.00  . Pack years: 62.50  . Types: Cigarettes  . Quit date: 03/20/1988  . Years since quitting: 32.0  Smokeless Tobacco Never Used   Counseling given: Not Answered   Continue to not smoke  Outpatient Encounter Medications as of 03/15/2020  Medication Sig  . albuterol (VENTOLIN HFA) 108 (90 Base) MCG/ACT inhaler Inhale 2 puffs into the lungs every  4 (four) hours as needed for wheezing or shortness of breath.   . budesonide-formoterol (SYMBICORT) 160-4.5 MCG/ACT inhaler INHALE 2 PUFFS BY MOUTH TWICE DAILY AS NEEDED  . cetirizine (ZYRTEC) 10 MG tablet Take 10 mg by mouth daily as needed for allergies.   . ciprofloxacin (CIPRO) 500 MG tablet Take 500 mg by mouth 2 (two) times daily.  Marland Kitchen gabapentin (NEURONTIN) 600 MG tablet Take 900 mg by mouth 3 (three) times daily.   Marland Kitchen losartan-hydrochlorothiazide (HYZAAR) 100-25 MG tablet Take 1 tablet by mouth daily.  . Multiple Vitamins-Minerals (PRESERVISION AREDS 2) CAPS Take 1 capsule by mouth daily.   Marland Kitchen nystatin (MYCOSTATIN) 100000 UNIT/ML suspension Take 5 mLs (500,000 Units total) by mouth 4 (four) times daily.  . rosuvastatin (CRESTOR) 10 MG tablet Take 10 mg by mouth daily.   . tamsulosin (FLOMAX) 0.4 MG CAPS capsule Take 0.4 mg by mouth daily.  . Tiotropium Bromide Monohydrate (SPIRIVA RESPIMAT) 2.5 MCG/ACT AERS Inhale 2 puffs into the lungs daily.  . traMADol (ULTRAM) 50 MG tablet Take 50 mg by mouth every 6 (six) hours as needed.   Facility-Administered Encounter Medications as of 03/15/2020  Medication  . 0.9 %  sodium chloride infusion     Review of Systems  Review of Systems  Constitutional: Negative for activity change, chills, fatigue, fever and unexpected weight change.  HENT:  Positive for sneezing. Negative for postnasal drip, rhinorrhea, sinus pressure, sinus pain and sore throat.   Eyes: Negative.   Respiratory: Positive for cough. Negative for shortness of breath and wheezing.   Cardiovascular: Positive for chest pain (with sneezing ). Negative for palpitations.  Gastrointestinal: Negative for constipation, diarrhea, nausea and vomiting.  Endocrine: Negative.   Genitourinary: Negative.   Musculoskeletal: Negative.   Skin: Negative.   Neurological: Negative for dizziness and headaches.  Psychiatric/Behavioral: Negative.  Negative for dysphoric mood. The patient is not nervous/anxious.   All other systems reviewed and are negative.    Physical Exam  BP 122/62 (BP Location: Left Arm, Cuff Size: Normal)   Pulse 62   Temp (!) 97.3 F (36.3 C) (Oral)   Ht 6\' 5"  (1.956 m)   Wt 284 lb (128.8 kg)   SpO2 98%   BMI 33.68 kg/m   Wt Readings from Last 5 Encounters:  03/15/20 284 lb (128.8 kg)  02/09/20 277 lb 6.4 oz (125.8 kg)  01/30/20 277 lb (125.6 kg)  01/14/20 276 lb 3.2 oz (125.3 kg)  01/02/20 280 lb (127 kg)    BMI Readings from Last 5 Encounters:  03/15/20 33.68 kg/m  02/09/20 33.77 kg/m  01/30/20 33.94 kg/m  01/14/20 33.62 kg/m  01/02/20 34.08 kg/m     Physical Exam Vitals and nursing note reviewed.  Constitutional:      General: He is not in acute distress.    Appearance: Normal appearance. He is obese.  HENT:     Head: Normocephalic and atraumatic.     Right Ear: Hearing and external ear normal.     Left Ear: Hearing and external ear normal.     Nose: Mucosal edema, congestion and rhinorrhea present. Rhinorrhea is purulent.     Right Turbinates: Not enlarged.     Left Turbinates: Not enlarged.     Mouth/Throat:     Mouth: Mucous membranes are dry.     Pharynx: Oropharynx is clear. No oropharyngeal exudate.  Eyes:     Pupils: Pupils are equal, round, and reactive to light.  Cardiovascular:     Rate and Rhythm:  Normal rate  and regular rhythm.     Pulses: Normal pulses.     Heart sounds: Normal heart sounds. No murmur heard.   Pulmonary:     Effort: Pulmonary effort is normal.     Breath sounds: No decreased breath sounds, wheezing or rales.  Musculoskeletal:     Cervical back: Normal range of motion.     Right lower leg: No edema.     Left lower leg: No edema.  Lymphadenopathy:     Cervical: No cervical adenopathy.  Skin:    General: Skin is warm and dry.     Capillary Refill: Capillary refill takes less than 2 seconds.     Findings: No erythema or rash.  Neurological:     General: No focal deficit present.     Mental Status: He is alert and oriented to person, place, and time.     Motor: No weakness.     Coordination: Coordination normal.     Gait: Gait is intact. Gait normal.  Psychiatric:        Mood and Affect: Mood normal.        Behavior: Behavior normal. Behavior is cooperative.        Thought Content: Thought content normal.        Judgment: Judgment normal.       Assessment & Plan:   Bronchiectasis without complication ([place]) Plan: Start flutter valve, provided today Reviewed bronchiectasis today   COPD GOLD II Reviewed pulmonary function testing today Reviewed 2020 PFT as well  Plan: Nystatin today for oral thrush Continue Symbicort 160 Continue Spiriva Respimat 2.5 Reviewed with patient that he needs to rinse mouth out after using inhalers Start flutter valve Okay to proceed forward with purchasing air physiodevice if patient would like   Cough Nasal congestion on physical exam Postnasal drip on physical exam Not currently on a daily antihistamine Occasionally uses Flonase Has used nasal saline rinses in the past and found relief Known bronchiectasis  Plan: Resume daily antihistamine Start nasal saline rinses Can use Flonase after nasal saline rinse Start flutter valve   Right shoulder pain Plan: Proceed forward with right shoulder surgery as  planned  Right-sided chest wall pain Suspect that right-sided chest wall pain is directly related to pending right shoulder surgery Do not see a pulmonary component in this Would recommend patient discuss this with cardiology as well      Return in about 3 months (around 06/13/2020), or if symptoms worsen or fail to improve, for Follow up with Dr. Purnell Shoemaker.   Lauraine Rinne, NP 03/15/2020   This appointment required 32 minutes of patient care (this includes precharting, chart review, review of results, face-to-face care, etc.).

## 2020-03-15 NOTE — Assessment & Plan Note (Signed)
Plan: Proceed forward with right shoulder surgery as planned

## 2020-03-16 NOTE — H&P (Signed)
Patient's anticipated LOS is less than 2 midnights, meeting these requirements: - Younger than 51 - Lives within 1 hour of care - Has a competent adult at home to recover with post-op recover - NO history of  - Chronic pain requiring opiods  - Diabetes  - Coronary Artery Disease  - Heart failure  - Heart attack  - Stroke  - DVT/VTE  - Cardiac arrhythmia  - Respiratory Failure/COPD  - Renal failure  - Anemia  - Advanced Liver disease       Noah Huffman is an 79 y.o. male.    Chief Complaint: right shoulder pain  HPI: Pt is a 79 y.o. male complaining of right shoulder pain for multiple years. Pain had continually increased since the beginning. X-rays in the clinic show end-stage arthritic changes of the right shoulder. Pt has tried various conservative treatments which have failed to alleviate their symptoms, including injections and therapy. Various options are discussed with the patient. Risks, benefits and expectations were discussed with the patient. Patient understand the risks, benefits and expectations and wishes to proceed with surgery.   PCP:  Creola Corn, MD  D/C Plans: Home  PMH: Past Medical History:  Diagnosis Date  . Allergy    seasonal  . Asthma   . Atrial fibrillation (HCC)   . BPH (benign prostatic hypertrophy) with urinary obstruction   . Cataract    removed both eyes  . Diverticulosis   . GERD (gastroesophageal reflux disease)   . Hyperglycemia   . Hyperlipidemia   . Hypertension   . Inguinal hernia   . Neuromuscular disorder (HCC)    neuropathy in feet   . Obesity   . OSA (obstructive sleep apnea) 06/06/2013  . Osteoarthritis   . Sleep apnea    no cpap    PSH: Past Surgical History:  Procedure Laterality Date  . 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 APPENDECTOMY    . arthroscopic knee surgery     . CATARACT EXTRACTION  2012   bilateral  . COLONOSCOPY    . HAMMER TOE SURGERY    . INGUINAL HERNIA REPAIR     right  . POLYPECTOMY    . TONSILLECTOMY AND ADENOIDECTOMY    . TOTAL HIP ARTHROPLASTY  2007   right    Social History:  reports that he quit smoking about 32 years ago. His smoking use included cigarettes. He has a 62.50 pack-year smoking history. He has never used smokeless tobacco. He reports current alcohol use of about 1.0 standard drink of alcohol per week. He reports that he does not use drugs.  Allergies:  Allergies  Allergen Reactions  . Macrobid [Nitrofurantoin]   . Morphine And Related Rash    Medications: Current Facility-Administered Medications  Medication Dose Route Frequency Provider Last Rate Last Admin  . 0.9 %  sodium chloride infusion  500 mL Intravenous Once Hilarie Fredrickson, MD       Current Outpatient Medications  Medication Sig Dispense Refill  . albuterol (VENTOLIN HFA) 108 (90 Base) MCG/ACT inhaler Inhale 2 puffs into the lungs every 4 (four) hours as needed for wheezing or shortness of breath.     . budesonide-formoterol (SYMBICORT) 160-4.5 MCG/ACT inhaler INHALE 2 PUFFS BY MOUTH TWICE DAILY AS NEEDED 10.2 g 5  . cetirizine (ZYRTEC) 10 MG tablet Take 10 mg by mouth daily as needed  for allergies.   4  . ciprofloxacin (CIPRO) 500 MG tablet Take 500 mg by mouth 2 (two) times daily.    Marland Kitchen gabapentin (NEURONTIN) 600 MG tablet Take 900 mg by mouth 3 (three) times daily.   6  . losartan-hydrochlorothiazide (HYZAAR) 100-25 MG tablet Take 1 tablet by mouth daily.    . Multiple Vitamins-Minerals (PRESERVISION AREDS 2) CAPS Take 1 capsule by mouth daily.     Marland Kitchen nystatin (MYCOSTATIN) 100000 UNIT/ML suspension Take 5 mLs (500,000 Units total) by mouth 4 (four) times daily. 60 mL 0  . rosuvastatin (CRESTOR) 10 MG tablet Take 10 mg by mouth daily.     . tamsulosin (FLOMAX) 0.4 MG CAPS capsule Take 0.4 mg by mouth daily.    . Tiotropium Bromide Monohydrate  (SPIRIVA RESPIMAT) 2.5 MCG/ACT AERS Inhale 2 puffs into the lungs daily. 1 each 5  . traMADol (ULTRAM) 50 MG tablet Take 50 mg by mouth every 6 (six) hours as needed.      No results found for this or any previous visit (from the past 48 hour(s)). CUP PACEART REMOTE DEVICE CHECK  Result Date: 03/15/2020 ILR summary report received. Battery status OK. Normal device function. No new symptom, brady, or pause episodes. No new AF episodes. 2 tachy episodes, narrow complex, sudden onset, occurs while active.  Monthly summary reports and ROV/PRN   ROS: Pain with rom of the right upper extremity  Physical Exam: Alert and oriented 79 y.o. male in no acute distress Cranial nerves 2-12 intact Cervical spine: full rom with no tenderness, nv intact distally Chest: active breath sounds bilaterally, no wheeze rhonchi or rales Heart: regular rate and rhythm, no murmur Abd: non tender non distended with active bowel sounds Hip is stable with rom  Right shoulder painful rom  nv intact distally No rashes or edema  Weakness with ER and IR  Assessment/Plan Assessment: right shoulder cuff arthropathy  Plan:  Patient will undergo a right reverse total shoulder by Dr. Veverly Fells at [facility] Risks benefits and expectations were discussed with the patient. Patient understand risks, benefits and expectations and wishes to proceed. Preoperative templating of the joint replacement has been completed, documented, and submitted to the Operating Room personnel in order to optimize intra-operative equipment management.   Merla Riches PA-C, MPAS [facility] is now [employer] [address], [place], [place], [place] [address] Phone: [phone] [email or web address] Facebook  [employer]

## 2020-03-17 NOTE — Patient Instructions (Addendum)
DUE TO COVID-19 ONLY ONE VISITOR IS ALLOWED TO COME WITH YOU AND STAY IN THE WAITING ROOM ONLY DURING PRE OP AND PROCEDURE.   IF YOU WILL BE ADMITTED INTO THE HOSPITAL YOU ARE ALLOWED ONE SUPPORT PERSON DURING VISITATION HOURS ONLY (10AM -8PM)   . The support person may change daily. . The support person must pass our screening, gel in and out, and wear a mask at all times, including in the patient's room. . Patients must also wear a mask when staff or their support person are in the room.   COVID SWAB TESTING MUST BE COMPLETED ON:  Tuesday, 03-23-20 @    [address] [place], [place] [address]  (Must self quarantine after testing. Follow instructions on handout.)        Your procedure is scheduled on:  Friday, 03-26-20   Report to [facility] Main  Entrance    Report to admitting at 10:00 AM   Call this number if you have problems the morning of surgery [phone]   Do not eat food :After Midnight.   May have liquids until 9:15 AM day of surgery  CLEAR LIQUID DIET  Foods Allowed                                                                     Foods Excluded  Water, Black Coffee and tea, regular and decaf              liquids that you cannot  Plain Jell-O in any flavor  (No red)                                     see through such as: Fruit ices (not with fruit pulp)                                      milk, soups, orange juice              Iced Popsicles (No red)                                      All solid food                                   Apple juices Sports drinks like Gatorade (No red) Lightly seasoned clear broth or consume(fat free) Sugar, honey syrup   Complete one Ensure drink the morning of surgery at 9:15 AM the day of surgery.     Oral Hygiene is also important to reduce your risk of infection.                                    Remember - BRUSH YOUR TEETH THE MORNING OF SURGERY WITH YOUR REGULAR TOOTHPASTE   Do NOT smoke after  Midnight   Take these medicines the morning of  surgery with A SIP OF WATER:  Zyrtec, Gabapentin, Crestor, Tamsulosin.  Okay to use inhalers and bring with you  day of surgery.                               You may not have any metal on your body including jewelry, and body piercings             Do not wear lotions, powders, perfumes/cologne, or deodorant             Men may shave face and neck.   Do not bring valuables to the hospital. [place].   Contacts, dentures or bridgework may not be worn into surgery.   Bring small overnight bag day of surgery.     Special Instructions: Bring a copy of your healthcare power of attorney and living will documents         the day of surgery if you haven't scanned them in before.              Please read over the following fact sheets you were given: IF YOU HAVE QUESTIONS ABOUT YOUR PRE OP INSTRUCTIONS PLEASE CALL [facility]- Preparing for Total Shoulder Arthroplasty    Before surgery, you can play an important role. Because skin is not sterile, your skin needs to be as free of germs as possible. You can reduce the number of germs on your skin by using the following products. . Benzoyl Peroxide Gel o Reduces the number of germs present on the skin o Applied twice a day to shoulder area starting two days before surgery    ==================================================================  Please follow these instructions carefully:  BENZOYL PEROXIDE 5% GEL  Please do not use if you have an allergy to benzoyl peroxide.   If your skin becomes reddened/irritated stop using the benzoyl peroxide.  Starting two days before surgery, apply as follows: 1. Apply benzoyl peroxide in the morning and at night. Apply after taking a shower. If you are not taking a shower clean entire shoulder front, back, and side along with the armpit with a clean wet washcloth.  2. Place a quarter-sized dollop on your  shoulder and rub in thoroughly, making sure to cover the front, back, and side of your shoulder, along with the armpit.   2 days before ____ AM   ____ PM              1 day before ____ AM   ____ PM                         3. Do this twice a day for two days.  (Last application is the night before surgery, AFTER using the CHG soap as described below).  4. Do NOT apply benzoyl peroxide gel on the day of surgery.   [facility] - Preparing for Surgery Before surgery, you can play an important role.  Because skin is not sterile, your skin needs to be as free of germs as possible.  You can reduce the number of germs on your skin by washing with CHG (chlorahexidine gluconate) soap before surgery.  CHG is an antiseptic cleaner which kills germs and bonds with the skin to continue killing germs even after washing. Please DO NOT use if you have an allergy to CHG or antibacterial soaps.  If your skin becomes  reddened/irritated stop using the CHG and inform your nurse when you arrive at Short Stay. Do not shave (including legs and underarms) for at least 48 hours prior to the first CHG shower.  You may shave your face/neck.  Please follow these instructions carefully:  1.  Shower with CHG Soap the night before surgery and the  morning of surgery.  2.  If you choose to wash your hair, wash your hair first as usual with your normal  shampoo.  3.  After you shampoo, rinse your hair and body thoroughly to remove the shampoo.                             4.  Use CHG as you would any other liquid soap.  You can apply chg directly to the skin and wash.  Gently with a scrungie or clean washcloth.  5.  Apply the CHG Soap to your body ONLY FROM THE NECK DOWN.   Do   not use on face/ open                           Wound or open sores. Avoid contact with eyes, ears mouth and   genitals (private parts).                       Wash face,  Genitals (private parts) with your normal soap.             6.  Wash thoroughly,  paying special attention to the area where your    surgery  will be performed.  7.  Thoroughly rinse your body with warm water from the neck down.  8.  DO NOT shower/wash with your normal soap after using and rinsing off the CHG Soap.                9.  Pat yourself dry with a clean towel.            10.  Wear clean pajamas.            11.  Place clean sheets on your bed the night of your first shower and do not  sleep with pets. Day of Surgery : Do not apply any lotions/deodorants the morning of surgery.  Please wear clean clothes to the hospital/surgery center.  FAILURE TO FOLLOW THESE INSTRUCTIONS MAY RESULT IN THE CANCELLATION OF YOUR SURGERY  PATIENT SIGNATURE_________________________________  NURSE SIGNATURE__________________________________  ________________________________________________________________________   Adam Phenix  An incentive spirometer is a tool that can help keep your lungs clear and active. This tool measures how well you are filling your lungs with each breath. Taking long deep breaths may help reverse or decrease the chance of developing breathing (pulmonary) problems (especially infection) following:  A long period of time when you are unable to move or be active. BEFORE THE PROCEDURE   If the spirometer includes an indicator to show your best effort, your nurse or respiratory therapist will set it to a desired goal.  If possible, sit up straight or lean slightly forward. Try not to slouch.  Hold the incentive spirometer in an upright position. INSTRUCTIONS FOR USE  1. Sit on the edge of your bed if possible, or sit up as far as you can in bed or on a chair. 2. Hold the incentive spirometer in an upright position. 3. Breathe out normally. 4. Place the mouthpiece  in your mouth and seal your lips tightly around it. 5. Breathe in slowly and as deeply as possible, raising the piston or the ball toward the top of the column. 6. Hold your breath for  3-5 seconds or for as long as possible. Allow the piston or ball to fall to the bottom of the column. 7. Remove the mouthpiece from your mouth and breathe out normally. 8. Rest for a few seconds and repeat Steps 1 through 7 at least 10 times every 1-2 hours when you are awake. Take your time and take a few normal breaths between deep breaths. 9. The spirometer may include an indicator to show your best effort. Use the indicator as a goal to work toward during each repetition. 10. After each set of 10 deep breaths, practice coughing to be sure your lungs are clear. If you have an incision (the cut made at the time of surgery), support your incision when coughing by placing a pillow or rolled up towels firmly against it. Once you are able to get out of bed, walk around indoors and cough well. You may stop using the incentive spirometer when instructed by your caregiver.  RISKS AND COMPLICATIONS  Take your time so you do not get dizzy or light-headed.  If you are in pain, you may need to take or ask for pain medication before doing incentive spirometry. It is harder to take a deep breath if you are having pain. AFTER USE  Rest and breathe slowly and easily.  It can be helpful to keep track of a log of your progress. Your caregiver can provide you with a simple table to help with this. If you are using the spirometer at home, follow these instructions: SEEK MEDICAL CARE IF:   You are having difficultly using the spirometer.  You have trouble using the spirometer as often as instructed.  Your pain medication is not giving enough relief while using the spirometer.  You develop fever of 100.5 F (38.1 C) or higher. SEEK IMMEDIATE MEDICAL CARE IF:   You cough up bloody sputum that had not been present before.  You develop fever of 102 F (38.9 C) or greater.  You develop worsening pain at or near the incision site. MAKE SURE YOU:   Understand these instructions.  Will watch your  condition.  Will get help right away if you are not doing well or get worse. Document Released: 07/17/2006 Document Revised: 05/29/2011 Document Reviewed: 09/17/2006 [facility] Patient Information 2014 [place], [place].   ________________________________________________________________________

## 2020-03-17 NOTE — Progress Notes (Addendum)
COVID Vaccine Completed:  x2 Date COVID Vaccine completed:  05-15-19 & 06-10-19 COVID vaccine manufacturer: Pfizer     PCP - Creola Corn, MD Cardiologist - Steffanie Dunn, MD Pulmonologist - Kalman Shan, MD  Chest x-ray - 11-30-19 in Epic EKG - 01-30-20 in Epic Stress Test - 07-22-15 in Epic ECHO - 02-11-19 in Epic Cardiac Cath -  Pacemaker/ICD device last checked: Long term monitor - 11-29-19 in Epic Aflutter Ablation - 01-02-20 in Epic Loop Recorder Implantation - 02-09-20  Sleep Study - 05-07-13 in Epic CPAP - No  Fasting Blood Sugar - N/A Checks Blood Sugar _____ times a day  Blood Thinner Instructions:  N/A Aspirin Instructions: Last Dose:  Anesthesia review: Aflutter/Afib, COPD, OSA (no CPAP)  Patient denies shortness of breath at rest, fever, cough and chest pain at PAT appointment.  Pt states that he is able to climb a flight of stairs and can do housework without assistance.  Patient does have SOB with exertion (sees pulmonology)   Patient verbalized understanding of instructions that were given to them at the PAT appointment. Patient was also instructed that they will need to review over the PAT instructions again at home before surgery.

## 2020-03-23 ENCOUNTER — Encounter (HOSPITAL_COMMUNITY): Payer: Self-pay

## 2020-03-23 ENCOUNTER — Encounter (HOSPITAL_COMMUNITY)
Admission: RE | Admit: 2020-03-23 | Discharge: 2020-03-23 | Disposition: A | Discharge status: Home / Self Care | Payer: PPO | Source: Ambulatory Visit | Attending: Orthopedic Surgery | Admitting: Orthopedic Surgery

## 2020-03-23 ENCOUNTER — Other Ambulatory Visit: Payer: Self-pay

## 2020-03-23 ENCOUNTER — Other Ambulatory Visit (HOSPITAL_COMMUNITY)
Admission: RE | Admit: 2020-03-23 | Discharge: 2020-03-23 | Disposition: A | Discharge status: Home / Self Care | Payer: PPO | Source: Ambulatory Visit | Attending: Orthopedic Surgery | Admitting: Orthopedic Surgery

## 2020-03-23 DIAGNOSIS — Z01812 Encounter for preprocedural laboratory examination: Secondary | ICD-10-CM | POA: Diagnosis not present

## 2020-03-23 DIAGNOSIS — Z20822 Contact with and (suspected) exposure to covid-19: Secondary | ICD-10-CM | POA: Diagnosis not present

## 2020-03-23 HISTORY — DX: Chronic obstructive pulmonary disease, unspecified: J44.9

## 2020-03-23 LAB — BASIC METABOLIC PANEL
Anion gap: 10 (ref 5–15)
BUN: 20 mg/dL (ref 8–23)
CO2: 30 mmol/L (ref 22–32)
Calcium: 9.3 mg/dL (ref 8.9–10.3)
Chloride: 103 mmol/L (ref 98–111)
Creatinine, Ser: 0.61 mg/dL (ref 0.61–1.24)
GFR, Estimated: >60 mL/min (ref >60)
Glucose, Bld: 133 mg/dL — ABNORMAL HIGH (ref 70–99)
Potassium: 3.8 mmol/L (ref 3.5–5.1)
Sodium: 143 mmol/L (ref 135–145)

## 2020-03-23 LAB — CBC
HCT: 45.8 % (ref 39.0–52.0)
Hemoglobin: 15.4 g/dL (ref 13.0–17.0)
MCH: 31.4 pg (ref 26.0–34.0)
MCHC: 33.6 g/dL (ref 30.0–36.0)
MCV: 93.3 fL (ref 80.0–100.0)
Platelets: 225 10*3/uL (ref 150–400)
RBC: 4.91 MIL/uL (ref 4.22–5.81)
RDW: 13.8 % (ref 11.5–15.5)
WBC: 8.4 10*3/uL (ref 4.0–10.5)
nRBC: 0 % (ref 0.0–0.2)

## 2020-03-23 LAB — SURGICAL PCR SCREEN
MRSA, PCR: NEGATIVE
Staphylococcus aureus: NEGATIVE

## 2020-03-23 LAB — SARS CORONAVIRUS 2 (TAT 6-24 HRS): SARS Coronavirus 2: NEGATIVE

## 2020-03-25 MED ORDER — DEXTROSE 5 % IV SOLN
3.0000 g | INTRAVENOUS | Status: AC
  Administered 2020-03-26: 3 g via INTRAVENOUS
  Filled 2020-03-25: qty 3.33

## 2020-03-26 ENCOUNTER — Ambulatory Visit (HOSPITAL_COMMUNITY)
Admission: RE | Admit: 2020-03-26 | Discharge: 2020-03-26 | Disposition: A | Discharge status: Home / Self Care | Payer: PPO | Attending: Orthopedic Surgery | Admitting: Orthopedic Surgery

## 2020-03-26 ENCOUNTER — Encounter (HOSPITAL_COMMUNITY): Payer: Self-pay | Admitting: Orthopedic Surgery

## 2020-03-26 ENCOUNTER — Ambulatory Visit (HOSPITAL_COMMUNITY): Payer: PPO | Admitting: Certified Registered Nurse Anesthetist

## 2020-03-26 ENCOUNTER — Other Ambulatory Visit: Payer: Self-pay

## 2020-03-26 ENCOUNTER — Encounter (HOSPITAL_COMMUNITY)
Admission: RE | Disposition: A | Discharge status: Home / Self Care | Payer: Self-pay | Source: Home / Self Care | Attending: Orthopedic Surgery

## 2020-03-26 ENCOUNTER — Ambulatory Visit (HOSPITAL_COMMUNITY): Payer: PPO | Admitting: Physician Assistant

## 2020-03-26 DIAGNOSIS — M1990 Unspecified osteoarthritis, unspecified site: Secondary | ICD-10-CM | POA: Insufficient documentation

## 2020-03-26 DIAGNOSIS — Z881 Allergy status to other antibiotic agents status: Secondary | ICD-10-CM | POA: Insufficient documentation

## 2020-03-26 DIAGNOSIS — Z9889 Other specified postprocedural states: Secondary | ICD-10-CM | POA: Insufficient documentation

## 2020-03-26 DIAGNOSIS — I4891 Unspecified atrial fibrillation: Secondary | ICD-10-CM | POA: Insufficient documentation

## 2020-03-26 DIAGNOSIS — G629 Polyneuropathy, unspecified: Secondary | ICD-10-CM | POA: Diagnosis not present

## 2020-03-26 DIAGNOSIS — Z79899 Other long term (current) drug therapy: Secondary | ICD-10-CM | POA: Diagnosis not present

## 2020-03-26 DIAGNOSIS — Z885 Allergy status to narcotic agent status: Secondary | ICD-10-CM | POA: Insufficient documentation

## 2020-03-26 DIAGNOSIS — M19011 Primary osteoarthritis, right shoulder: Secondary | ICD-10-CM | POA: Diagnosis not present

## 2020-03-26 DIAGNOSIS — Z87891 Personal history of nicotine dependence: Secondary | ICD-10-CM | POA: Insufficient documentation

## 2020-03-26 DIAGNOSIS — M75101 Unspecified rotator cuff tear or rupture of right shoulder, not specified as traumatic: Secondary | ICD-10-CM | POA: Diagnosis not present

## 2020-03-26 DIAGNOSIS — M12811 Other specific arthropathies, not elsewhere classified, right shoulder: Secondary | ICD-10-CM | POA: Diagnosis not present

## 2020-03-26 DIAGNOSIS — Z7951 Long term (current) use of inhaled steroids: Secondary | ICD-10-CM | POA: Diagnosis not present

## 2020-03-26 DIAGNOSIS — J45909 Unspecified asthma, uncomplicated: Secondary | ICD-10-CM | POA: Diagnosis not present

## 2020-03-26 DIAGNOSIS — I483 Typical atrial flutter: Secondary | ICD-10-CM | POA: Diagnosis not present

## 2020-03-26 DIAGNOSIS — G8918 Other acute postprocedural pain: Secondary | ICD-10-CM | POA: Diagnosis not present

## 2020-03-26 DIAGNOSIS — M25511 Pain in right shoulder: Secondary | ICD-10-CM

## 2020-03-26 HISTORY — PX: REVERSE SHOULDER ARTHROPLASTY: SHX5054

## 2020-03-26 HISTORY — DX: Cardiac arrhythmia, unspecified: I49.9

## 2020-03-26 SURGERY — ARTHROPLASTY, SHOULDER, TOTAL, REVERSE
Anesthesia: Regional | Site: Shoulder | Laterality: Right

## 2020-03-26 MED ORDER — FENTANYL CITRATE (PF) 100 MCG/2ML IJ SOLN
50.0000 ug | INTRAMUSCULAR | Status: AC
  Administered 2020-03-26: 25 ug via INTRAVENOUS
  Filled 2020-03-26: qty 2

## 2020-03-26 MED ORDER — FENTANYL CITRATE (PF) 250 MCG/5ML IJ SOLN
INTRAMUSCULAR | Status: DC | PRN
  Administered 2020-03-26 (×2): 50 ug via INTRAVENOUS

## 2020-03-26 MED ORDER — ORAL CARE MOUTH RINSE: 15.0000 mL | Freq: Once | OROMUCOSAL | Status: AC

## 2020-03-26 MED ORDER — SODIUM CHLORIDE 0.9 % IR SOLN
Status: DC | PRN
  Administered 2020-03-26: 1000 mL

## 2020-03-26 MED ORDER — LIDOCAINE 2% (20 MG/ML) 5 ML SYRINGE
INTRAMUSCULAR | Status: DC | PRN
  Administered 2020-03-26: 80 mg via INTRAVENOUS

## 2020-03-26 MED ORDER — LACTATED RINGERS IV SOLN: INTRAVENOUS | Status: DC

## 2020-03-26 MED ORDER — DEXAMETHASONE SODIUM PHOSPHATE 10 MG/ML IJ SOLN
INTRAMUSCULAR | Status: AC
  Filled 2020-03-26: qty 1

## 2020-03-26 MED ORDER — PHENYLEPHRINE HCL-NACL 10-0.9 MG/250ML-% IV SOLN
INTRAVENOUS | Status: DC | PRN
  Administered 2020-03-26: 50 ug/min via INTRAVENOUS

## 2020-03-26 MED ORDER — ONDANSETRON HCL 4 MG/2ML IJ SOLN
INTRAMUSCULAR | Status: DC | PRN
  Administered 2020-03-26: 4 mg via INTRAVENOUS

## 2020-03-26 MED ORDER — MIDAZOLAM HCL 2 MG/2ML IJ SOLN
1.0000 mg | INTRAMUSCULAR | Status: DC
  Filled 2020-03-26: qty 2

## 2020-03-26 MED ORDER — BUPIVACAINE-EPINEPHRINE (PF) 0.25% -1:200000 IJ SOLN
INTRAMUSCULAR | Status: AC
  Filled 2020-03-26: qty 30

## 2020-03-26 MED ORDER — ALBUTEROL SULFATE HFA 108 (90 BASE) MCG/ACT IN AERS
INHALATION_SPRAY | RESPIRATORY_TRACT | Status: AC
  Filled 2020-03-26: qty 6.7

## 2020-03-26 MED ORDER — BUPIVACAINE LIPOSOME 1.3 % IJ SUSP
INTRAMUSCULAR | Status: DC | PRN
  Administered 2020-03-26: 10 mL via PERINEURAL

## 2020-03-26 MED ORDER — ALBUTEROL SULFATE HFA 108 (90 BASE) MCG/ACT IN AERS
INHALATION_SPRAY | RESPIRATORY_TRACT | Status: DC | PRN
  Administered 2020-03-26: 4 via RESPIRATORY_TRACT

## 2020-03-26 MED ORDER — HYDROCODONE-ACETAMINOPHEN 5-325 MG PO TABS: 1.0000 | ORAL_TABLET | Freq: Four times a day (QID) | ORAL | 0 refills | Status: DC | PRN

## 2020-03-26 MED ORDER — EPHEDRINE SULFATE-NACL 50-0.9 MG/10ML-% IV SOSY
PREFILLED_SYRINGE | INTRAVENOUS | Status: DC | PRN
  Administered 2020-03-26 (×2): 10 mg via INTRAVENOUS

## 2020-03-26 MED ORDER — LIDOCAINE HCL (PF) 2 % IJ SOLN
INTRAMUSCULAR | Status: AC
  Filled 2020-03-26: qty 5

## 2020-03-26 MED ORDER — ACETAMINOPHEN 500 MG PO TABS
1000.0000 mg | ORAL_TABLET | Freq: Once | ORAL | Status: AC
  Administered 2020-03-26: 1000 mg via ORAL
  Filled 2020-03-26: qty 2

## 2020-03-26 MED ORDER — BUPIVACAINE-EPINEPHRINE (PF) 0.25% -1:200000 IJ SOLN
INTRAMUSCULAR | Status: DC | PRN
  Administered 2020-03-26: 11 mL

## 2020-03-26 MED ORDER — FENTANYL CITRATE (PF) 100 MCG/2ML IJ SOLN
INTRAMUSCULAR | Status: AC
  Filled 2020-03-26: qty 2

## 2020-03-26 MED ORDER — ONDANSETRON HCL 4 MG/2ML IJ SOLN: 4.0000 mg | Freq: Once | INTRAMUSCULAR | Status: DC | PRN

## 2020-03-26 MED ORDER — SUCCINYLCHOLINE CHLORIDE 200 MG/10ML IV SOSY
PREFILLED_SYRINGE | INTRAVENOUS | Status: DC | PRN
  Administered 2020-03-26: 140 mg via INTRAVENOUS

## 2020-03-26 MED ORDER — SUCCINYLCHOLINE CHLORIDE 200 MG/10ML IV SOSY
PREFILLED_SYRINGE | INTRAVENOUS | Status: AC
  Filled 2020-03-26: qty 10

## 2020-03-26 MED ORDER — PROPOFOL 10 MG/ML IV BOLUS
INTRAVENOUS | Status: AC
  Filled 2020-03-26: qty 20

## 2020-03-26 MED ORDER — BUPIVACAINE HCL (PF) 0.25 % IJ SOLN
INTRAMUSCULAR | Status: DC | PRN
  Administered 2020-03-26: 15 mL via EPIDURAL

## 2020-03-26 MED ORDER — ONDANSETRON HCL 4 MG PO TABS: 4.0000 mg | ORAL_TABLET | Freq: Three times a day (TID) | ORAL | 1 refills | Status: DC | PRN

## 2020-03-26 MED ORDER — METHOCARBAMOL 500 MG PO TABS: 500.0000 mg | ORAL_TABLET | Freq: Three times a day (TID) | ORAL | 1 refills | Status: DC | PRN

## 2020-03-26 MED ORDER — PROPOFOL 10 MG/ML IV BOLUS
INTRAVENOUS | Status: DC | PRN
  Administered 2020-03-26: 150 mg via INTRAVENOUS

## 2020-03-26 MED ORDER — EPHEDRINE 5 MG/ML INJ
INTRAVENOUS | Status: AC
  Filled 2020-03-26: qty 10

## 2020-03-26 MED ORDER — CHLORHEXIDINE GLUCONATE 0.12 % MT SOLN
15.0000 mL | Freq: Once | OROMUCOSAL | Status: AC
  Administered 2020-03-26: 15 mL via OROMUCOSAL

## 2020-03-26 MED ORDER — ONDANSETRON HCL 4 MG/2ML IJ SOLN
INTRAMUSCULAR | Status: AC
  Filled 2020-03-26: qty 2

## 2020-03-26 MED ORDER — FENTANYL CITRATE (PF) 100 MCG/2ML IJ SOLN: 25.0000 ug | INTRAMUSCULAR | Status: DC | PRN

## 2020-03-26 SURGICAL SUPPLY — 79 items
AID PSTN UNV HD RSTRNT DISP (MISCELLANEOUS) ×1
BAG SPEC THK2 15X12 ZIP CLS (MISCELLANEOUS)
BAG ZIPLOCK 12X15 (MISCELLANEOUS) IMPLANT
BIT DRILL 1.6MX128 (BIT) ×1 IMPLANT
BIT DRILL 170X2.5X (BIT) IMPLANT
BIT DRL 170X2.5X (BIT) ×1
BLADE SAG 18X100X1.27 (BLADE) ×2 IMPLANT
CLSR STERI-STRIP ANTIMIC 1/2X4 (GAUZE/BANDAGES/DRESSINGS) ×1 IMPLANT
COVER BACK TABLE 60X90IN (DRAPES) ×2 IMPLANT
COVER SURGICAL LIGHT HANDLE (MISCELLANEOUS) ×2 IMPLANT
COVER WAND RF STERILE (DRAPES) IMPLANT
CUP HUMERAL 42 PLUS 3 (Orthopedic Implant) ×1 IMPLANT
DECANTER SPIKE VIAL GLASS SM (MISCELLANEOUS) ×2 IMPLANT
DRAPE INCISE IOBAN 66X45 STRL (DRAPES) ×2 IMPLANT
DRAPE ORTHO SPLIT 77X108 STRL (DRAPES) ×4
DRAPE SHEET LG 3/4 BI-LAMINATE (DRAPES) ×2 IMPLANT
DRAPE SURG ORHT 6 SPLT 77X108 (DRAPES) ×2 IMPLANT
DRAPE TOP 10253 STERILE (DRAPES) ×2 IMPLANT
DRAPE U-SHAPE 47X51 STRL (DRAPES) ×2 IMPLANT
DRILL 2.5 (BIT) ×2
DRSG ADAPTIC 3X8 NADH LF (GAUZE/BANDAGES/DRESSINGS) ×2 IMPLANT
DRSG PAD ABDOMINAL 8X10 ST (GAUZE/BANDAGES/DRESSINGS) ×2 IMPLANT
DURAPREP 26ML APPLICATOR (WOUND CARE) ×2 IMPLANT
ELECT BLADE TIP CTD 4 INCH (ELECTRODE) ×2 IMPLANT
ELECT NDL TIP 2.8 STRL (NEEDLE) ×1 IMPLANT
ELECT NEEDLE TIP 2.8 STRL (NEEDLE) ×2 IMPLANT
ELECT REM PT RETURN 15FT ADLT (MISCELLANEOUS) ×2 IMPLANT
EPIPHYSI RIGHT SZ 2 (Shoulder) ×2 IMPLANT
EPIPHYSIS RIGHT SZ 2 (Shoulder) IMPLANT
FACESHIELD WRAPAROUND (MASK) ×2 IMPLANT
FACESHIELD WRAPAROUND OR TEAM (MASK) ×1 IMPLANT
GAUZE SPONGE 4X4 12PLY STRL (GAUZE/BANDAGES/DRESSINGS) ×2 IMPLANT
GLENOSPHERE XTEND LAT 42+0 STD (Miscellaneous) ×1 IMPLANT
GLOVE BIOGEL PI ORTHO PRO 7.5 (GLOVE) ×1
GLOVE BIOGEL PI ORTHO PRO SZ8 (GLOVE) ×1
GLOVE ORTHO TXT STRL SZ7.5 (GLOVE) ×2 IMPLANT
GLOVE PI ORTHO PRO STRL 7.5 (GLOVE) ×1 IMPLANT
GLOVE PI ORTHO PRO STRL SZ8 (GLOVE) ×1 IMPLANT
GLOVE SURG ORTHO 8.5 STRL (GLOVE) ×2 IMPLANT
GOWN STRL REUS W/TWL XL LVL3 (GOWN DISPOSABLE) ×4 IMPLANT
KIT BASIN OR (CUSTOM PROCEDURE TRAY) ×2 IMPLANT
KIT TURNOVER KIT A (KITS) IMPLANT
MANIFOLD NEPTUNE II (INSTRUMENTS) ×2 IMPLANT
METAGLENE DELTA EXTEND (Trauma) IMPLANT
METAGLENE DXTEND (Trauma) ×2 IMPLANT
NDL MAYO 6 CRC TAPER PT (NEEDLE) IMPLANT
NDL MAYO CATGUT SZ4 TPR NDL (NEEDLE) IMPLANT
NEEDLE MAYO 6 CRC TAPER PT (NEEDLE) ×2 IMPLANT
NEEDLE MAYO CATGUT SZ4 (NEEDLE) IMPLANT
NS IRRIG 1000ML POUR BTL (IV SOLUTION) ×2 IMPLANT
PACK SHOULDER (CUSTOM PROCEDURE TRAY) ×2 IMPLANT
PASSER SUT SWANSON 36MM LOOP (INSTRUMENTS) ×1 IMPLANT
PENCIL SMOKE EVACUATOR (MISCELLANEOUS) IMPLANT
PIN GUIDE 1.2 (PIN) ×1 IMPLANT
PIN METAGLENE 2.5 (PIN) ×2 IMPLANT
PIN STEINMAN FIXATION KNEE (PIN) ×1 IMPLANT
PROTECTOR NERVE ULNAR (MISCELLANEOUS) ×2 IMPLANT
RESTRAINT HEAD UNIVERSAL NS (MISCELLANEOUS) ×2 IMPLANT
SCREW 4.5X24MM (Screw) ×4 IMPLANT
SCREW BN 24X4.5XLCK STRL (Screw) IMPLANT
SCREW LOCK 42 (Screw) ×1 IMPLANT
SLING ARM FOAM STRAP LRG (SOFTGOODS) IMPLANT
SLING ARM FOAM STRAP XLG (SOFTGOODS) ×1 IMPLANT
SMARTMIX MINI TOWER (MISCELLANEOUS)
SPONGE LAP 4X18 RFD (DISPOSABLE) IMPLANT
STEM 12 HA (Stem) ×1 IMPLANT
STRIP CLOSURE SKIN 1/2X4 (GAUZE/BANDAGES/DRESSINGS) ×2 IMPLANT
SUCTION FRAZIER HANDLE 10FR (MISCELLANEOUS) ×2
SUCTION TUBE FRAZIER 10FR DISP (MISCELLANEOUS) ×1 IMPLANT
SUT FIBERWIRE #2 38 T-5 BLUE (SUTURE) ×10
SUT MNCRL AB 4-0 PS2 18 (SUTURE) ×2 IMPLANT
SUT VIC AB 0 CT1 36 (SUTURE) ×4 IMPLANT
SUT VIC AB 0 CT2 27 (SUTURE) ×2 IMPLANT
SUT VIC AB 2-0 CT1 27 (SUTURE) ×2
SUT VIC AB 2-0 CT1 TAPERPNT 27 (SUTURE) ×1 IMPLANT
SUTURE FIBERWR #2 38 T-5 BLUE (SUTURE) ×2 IMPLANT
TAPE PAPER 3X10 WHT MICROPORE (GAUZE/BANDAGES/DRESSINGS) ×1 IMPLANT
TOWEL OR 17X26 10 PK STRL BLUE (TOWEL DISPOSABLE) ×2 IMPLANT
TOWER SMARTMIX MINI (MISCELLANEOUS) IMPLANT

## 2020-03-26 NOTE — Transfer of Care (Signed)
Immediate Anesthesia Transfer of Care Note  Patient: Noah Huffman  Procedure(s) Performed: REVERSE SHOULDER ARTHROPLASTY (Right Shoulder)  Patient Location: PACU  Anesthesia Type:General  Level of Consciousness: awake, alert , oriented and patient cooperative  Airway & Oxygen Therapy: Patient Spontanous Breathing and Patient connected to face mask oxygen  Post-op Assessment: Report given to RN and Post -op Vital signs reviewed and stable  Post vital signs: Reviewed and stable  Last Vitals:  Vitals Value Taken Time  BP 149/71 03/26/20 1509  Temp    Pulse 88 03/26/20 1512  Resp 24 03/26/20 1512  SpO2 96 % 03/26/20 1512  Vitals shown include unvalidated device data.  Last Pain:  Vitals:   03/26/20 1154  TempSrc:   PainSc: 0-No pain         Complications: No complications documented.

## 2020-03-26 NOTE — Anesthesia Postprocedure Evaluation (Signed)
Anesthesia Post Note  Patient: Noah Huffman  Procedure(s) Performed: REVERSE SHOULDER ARTHROPLASTY (Right Shoulder)     Patient location during evaluation: Phase II Anesthesia Type: Regional and General Level of consciousness: awake Pain management: pain level controlled Vital Signs Assessment: post-procedure vital signs reviewed and stable Respiratory status: spontaneous breathing and respiratory function stable Cardiovascular status: stable Postop Assessment: no apparent nausea or vomiting Anesthetic complications: no   No complications documented.  Last Vitals:  Vitals:   03/26/20 1600 03/26/20 1605  BP: 134/72 (!) 147/61  Pulse: 82 75  Resp: 12 16  Temp: 36.6 C 36.7 C  SpO2: 92% 91%    Last Pain:  Vitals:   03/26/20 1605  TempSrc:   PainSc: 0-No pain                 Merlinda Frederick

## 2020-03-26 NOTE — Evaluation (Signed)
Occupational Therapy Evaluation Patient Details Name: Noah Huffman MRN: [id number] DOB: [birth year] Today's Date: 03/26/2020    History of Present Illness Patient s/p right reverse TSA   Clinical Impression   Mr. Noah Huffman is a 80 year old man s/p shoulder replacement without functional use of right dominant upper extremity secondary to effects of surgery and interscalene block and shoulder precautions. Therapist provided education and instruction to patient and spouse in regards to exercises, precautions, positioning, donning upper extremity clothing and bathing while maintaining shoulder precautions, ice and edema management and donning/doffing sling. Patient and spouse verbalized understanding and demonstrated as needed. Patient needed assistance to donn shirt, underwear, pants, socks and shoes and provided with instruction on compensatory strategies to perform ADLs. Patient provided with handouts to maximize retention of education and instruction. Patient demonstrated exercises.  Patient to follow up with MD for further therapy needs.      Follow Up Recommendations  Follow surgeon's recommendation for DC plan and follow-up therapies    Equipment Recommendations  None recommended by OT    Recommendations for Other Services       Precautions / Restrictions Precautions Precautions: Shoulder Shoulder Interventions: Shoulder sling/immobilizer;At all times;Off for dressing/bathing/exercises Restrictions Other Position/Activity Restrictions: pnedulums, internal/external ROM      Mobility Bed Mobility                    Transfers                 General transfer comment: min guardf or safety s/p surger    Balance Overall balance assessment: Mild deficits observed, not formally tested                                         ADL either performed or assessed with clinical judgement   ADL                                          General ADL Comments: Patient mod assist to don sling and shirt. Needed assistance to don socks and shoes and assistance to pull clothing up over right hip.     Vision Patient Visual Report: No change from baseline       Perception     Praxis      Pertinent Vitals/Pain Pain Assessment: No/denies pain     Hand Dominance     Extremity/Trunk Assessment Upper Extremity Assessment Upper Extremity Assessment: RUE deficits/detail RUE Deficits / Details: Gross movement of elbow, wrist and hand, No shoulder ROM secondary to precautions   Lower Extremity Assessment Lower Extremity Assessment: Overall WFL for tasks assessed   Cervical / Trunk Assessment Cervical / Trunk Assessment: Normal   Communication     Cognition Arousal/Alertness: Awake/alert Behavior During Therapy: WFL for tasks assessed/performed Overall Cognitive Status: Within Functional Limits for tasks assessed                                     General Comments       Exercises     Shoulder Instructions Shoulder Instructions Donning/doffing shirt without moving shoulder: Patient able to independently direct caregiver;Caregiver independent with task Method for sponge bathing under operated UE: Patient able to independently direct  caregiver;Caregiver independent with task Donning/doffing sling/immobilizer: Patient able to independently direct caregiver;Caregiver independent with task Correct positioning of sling/immobilizer: Independent Pendulum exercises (written home exercise program): Independent ROM for elbow, wrist and digits of operated UE: Independent Sling wearing schedule (on at all times/off for ADL's): Independent Proper positioning of operated UE when showering: Independent Positioning of UE while sleeping: [place]                                          Prior Functioning/Environment                   OT Problem List: Decreased  strength;Decreased range of motion;Impaired UE functional use      OT Treatment/Interventions:      OT Goals(Current goals can be found in the care plan section) Acute Rehab OT Goals OT Goal Formulation: All assessment and education complete, DC therapy  OT Frequency:     Barriers to D/C:            Co-evaluation              AM-PAC OT "6 Clicks" Daily Activity     Outcome Measure Help from another person eating meals?: A Little Help from another person taking care of personal grooming?: A Little Help from another person toileting, which includes using toliet, bedpan, or urinal?: None Help from another person bathing (including washing, rinsing, drying)?: A Little Help from another person to put on and taking off regular upper body clothing?: A Lot Help from another person to put on and taking off regular lower body clothing?: A Lot 6 Click Score: 17   End of Session Nurse Communication:  (OT education/nistruction complete)  Activity Tolerance: Patient tolerated treatment well Patient left: in chair  OT Visit Diagnosis: Muscle weakness (generalized) (M62.81)                Time: 2458-0998 OT Time Calculation (min): 30 min Charges:  OT General Charges $OT Visit: 1 Visit OT Evaluation $OT Eval Low Complexity: 1 Low OT Treatments $Self Care/Home Management : 8-22 mins  Talyah Seder, OTR/L [place]  Office [phone] Pager: [phone]   Noah Huffman 03/26/2020, 5:18 PM

## 2020-03-26 NOTE — Anesthesia Preprocedure Evaluation (Addendum)
Anesthesia Evaluation  Patient identified by MRN, date of birth, ID band Patient awake    Reviewed: Allergy & Precautions, NPO status , Patient's Chart, lab work & pertinent test results  Airway Mallampati: II  TM Distance: >3 FB Neck ROM: Limited    Dental  (+) Missing   Pulmonary asthma , sleep apnea , COPD, former smoker,    Pulmonary exam normal breath sounds clear to auscultation       Cardiovascular METS: 3 - Mets hypertension, Normal cardiovascular exam+ dysrhythmias (s/p ablation) Atrial Fibrillation  Rhythm:Regular Rate:Normal     Neuro/Psych negative psych ROS   GI/Hepatic Neg liver ROS, GERD  ,  Endo/Other  negative endocrine ROS  Renal/GU negative Renal ROS     Musculoskeletal  (+) Arthritis ,   Abdominal (+) + obese,   Peds negative pediatric ROS (+)  Hematology negative hematology ROS (+)   Anesthesia Other Findings   Reproductive/Obstetrics negative OB ROS                            Anesthesia Physical Anesthesia Plan  ASA: III  Anesthesia Plan: General and Regional   Post-op Pain Management: GA combined w/ Regional for post-op pain   Induction: Intravenous  PONV Risk Score and Plan: 2 and Ondansetron, Dexamethasone and Treatment may vary due to age or medical condition  Airway Management Planned: Oral ETT  Additional Equipment:   Intra-op Plan:   Post-operative Plan: Extubation in OR  Informed Consent: I have reviewed the patients History and Physical, chart, labs and discussed the procedure including the risks, benefits and alternatives for the proposed anesthesia with the patient or authorized representative who has indicated his/her understanding and acceptance.     Dental advisory given  Plan Discussed with: Anesthesiologist, CRNA and Surgeon  Anesthesia Plan Comments: (Interscalene block with 0.25% bupiv + exparel. GETA. 1 PIV.)         Anesthesia Quick Evaluation

## 2020-03-26 NOTE — Anesthesia Procedure Notes (Signed)
Anesthesia Regional Block: Interscalene brachial plexus block   Pre-Anesthetic Checklist: ,, timeout performed, Correct Patient, Correct Site, Correct Laterality, Correct Procedure, Correct Position, site marked, Risks and benefits discussed,  Surgical consent,  Pre-op evaluation,  At surgeon's request and post-op pain management  Laterality: Right  Prep: chloraprep       Needles:  Injection technique: Single-shot  Needle Type: Echogenic Stimulator Needle     Needle Length: 10cm  Needle Gauge: 20     Additional Needles:   Procedures:,,,, ultrasound used (permanent image in chart),,,,  Narrative:  Start time: 03/26/2020 11:40 AM End time: 03/26/2020 11:45 AM Injection made incrementally with aspirations every 5 mL.  Performed by: Personally  Anesthesiologist: Merlinda Frederick, MD  Additional Notes: Functioning IV was confirmed and monitors applied.  A 29mm 22ga echogenic arrow stimulator was used. Sterile prep and drape,hand hygiene and sterile gloves were used.Ultrasound guidance: relevant anatomy identified, needle position confirmed, local anesthetic spread visualized around nerve(s)., vascular puncture avoided.  Image printed for medical record.  Negative aspiration and negative test dose prior to incremental administration of local anesthetic. The patient tolerated the procedure well.

## 2020-03-26 NOTE — Discharge Instructions (Signed)
Ice to the shoulder constantly.  Keep the incision covered and clean and dry for one week, then ok to get it wet in the shower. ° °Do exercise as instructed several times per day. ° °DO NOT reach behind your back or push up out of a chair with the operative arm. ° °Use a sling while you are up and around for comfort, may remove while seated.  Keep pillow propped behind the operative elbow. ° °Follow up with Dr Jiali Linney in two weeks in the office, call [phone] for appt °

## 2020-03-26 NOTE — Interval H&P Note (Signed)
History and Physical Interval Note:  03/26/2020 12:02 PM  Noah Huffman  has presented today for surgery, with the diagnosis of Right shoulder cuff arthropathy.  The various methods of treatment have been discussed with the patient and family. After consideration of risks, benefits and other options for treatment, the patient has consented to  Procedure(s) with comments: REVERSE SHOULDER ARTHROPLASTY (Right) - interscalane block as a surgical intervention.  The patient's history has been reviewed, patient examined, no change in status, stable for surgery.  I have reviewed the patient's chart and labs.  Questions were answered to the patient's satisfaction.     Augustin Schooling

## 2020-03-26 NOTE — Anesthesia Procedure Notes (Signed)
Procedure Name: Intubation Date/Time: 03/26/2020 12:40 PM Performed by: Mitzie Na, CRNA Pre-anesthesia Checklist: Patient identified, Emergency Drugs available, Suction available and Patient being monitored Patient Re-evaluated:Patient Re-evaluated prior to induction Oxygen Delivery Method: Circle system utilized Preoxygenation: Pre-oxygenation with 100% oxygen Induction Type: IV induction Ventilation: Oral airway inserted - appropriate to patient size and Mask ventilation with difficulty Laryngoscope Size: Mac and 4 Grade View: Grade I Tube type: Oral Tube size: 7.5 mm Number of attempts: 1 Airway Equipment and Method: Stylet and Oral airway Placement Confirmation: ETT inserted through vocal cords under direct vision,  positive ETCO2 and breath sounds checked- equal and bilateral Secured at: 25 cm Tube secured with: Tape Dental Injury: Teeth and Oropharynx as per pre-operative assessment

## 2020-03-26 NOTE — Progress Notes (Signed)
AssistedDr. Elgie Congo with right, ultrasound guided, interscalene  block. Side rails up, monitors on throughout procedure. See vital signs in flow sheet. Tolerated Procedure well.

## 2020-03-26 NOTE — Brief Op Note (Signed)
03/26/2020  2:53 PM  PATIENT:  [name]  80 y.o. male  PRE-OPERATIVE DIAGNOSIS:  Right shoulder cuff arthropathy  POST-OPERATIVE DIAGNOSIS:  Right shoulder cuff arthropathy  PROCEDURE:  Procedure(s) with comments: REVERSE SHOULDER ARTHROPLASTY (Right) - interscalane block DePuy Delta Xtend WITH subscap repair  SURGEON:  Surgeon(s) and Role:    Netta Cedars, MD - Primary  PHYSICIAN ASSISTANT:   ASSISTANTS: [place]ura Bruns, PA-C   ANESTHESIA:   regional and general  EBL:  200 mL   BLOOD ADMINISTERED:none  DRAINS: none   LOCAL MEDICATIONS USED:  MARCAINE     SPECIMEN:  No Specimen  DISPOSITION OF SPECIMEN:  N/A  COUNTS:  YES  TOURNIQUET:  * No tourniquets in log *  DICTATION: .Other Dictation: Dictation Number [id number]  PLAN OF CARE: Discharge to home after PACU  PATIENT DISPOSITION:  PACU - hemodynamically stable.   Delay start of Pharmacological VTE agent (>24hrs) due to surgical blood loss or risk of bleeding: not applicable

## 2020-03-27 NOTE — Op Note (Signed)
NAME: Noah Huffman, NOLT MEDICAL RECORD [id number] ACCOUNT [id number] DATE OF BIRTH:08-03-1940 FACILITY: [facility] LOCATION: [facility] PHYSICIAN:STEVEN Orlena Sheldon, MD  OPERATIVE REPORT  DATE OF PROCEDURE:  03/26/2020  PREOPERATIVE DIAGNOSIS:  Right shoulder rotator cuff tear arthropathy.  POSTOPERATIVE DIAGNOSIS:  Right shoulder rotator cuff tear arthropathy.  PROCEDURE PERFORMED:  Right reverse total shoulder replacement using DePuy Delta Xtend prosthesis with subscapularis repair.  ATTENDING SURGEON:  Esmond Plants, MD  ASSISTANT:  Darol Destine, [place], who was scrubbed during the entire procedure and necessary for satisfactory completion of surgery.  ANESTHESIA:  General anesthesia was used plus interscalene block.  ESTIMATED BLOOD LOSS:  200 mL.  FLUID REPLACEMENT:  1500 mL crystalloid.  INSTRUMENT COUNTS:  Correct.  COMPLICATIONS:  No complications.  ANTIBIOTICS:  Perioperative antibiotics were given.  INDICATIONS:  The patient is a 80 year old male with worsening right shoulder pain and dysfunction secondary to bone-on-bone arthritis with rotator cuff insufficiency.  The patient has had progressive pain despite conservative management and presents now  for operative treatment to restore function and eliminate pain in the shoulder.  Informed consent obtained.  DESCRIPTION OF PROCEDURE:  After an adequate level of anesthesia was achieved, the patient was positioned in the modified beach chair position.  Right shoulder correctly identified and sterilely prepped and draped in the usual manner.  Time-out called,  verifying correct patient and correct site.  We entered the patient's shoulder using a standard deltopectoral incision starting at the coracoid process extending down to the anterior humerus.  Dissection down through subcutaneous tissues using Bovie.  We  identified the cephalic vein and took that laterally with the deltoid pectoralis taken medially.  Conjoined tendon  identified and retracted medially.  We tenodesed the biceps in situ with figure-of-eight suture of 0 Vicryl x2.  We then released the  subscapularis subperiosteally off the lesser tuberosity and tagged for repair with #2 FiberWire suture in a modified Mason-Allen suture technique.  We then went to the inferior capsule and released that using a Bovie progressively externally rotating the  humerus.  We then released the biceps at the joint line and also the supraspinatus tendon and part of the infraspinatus tendon retaining the posterior infraspinatus and the teres minor.  We externally rotated the shoulder and delivered the humeral head  out of the wound.  This was devoid of cartilage with large osteophytes.  We entered the proximal humerus with a 6 mm reamer and then reamed up to a size 12.  We then used a 12 mm intramedullary resection guide and resected the head at 10 degrees of  retroversion.  At this point, we removed excess osteophytes with a rongeur.  We then subluxed the humerus posteriorly, gaining good exposure of the glenoid face, which also was devoid of cartilage.  We removed the capsule, which was hypertrophied and the  labrum.  We were careful to protect the axillary nerve.  We placed deep retractors.  We then went ahead and placed our guide pin centered low on this right shoulder.  Once we had that guide pin in place, we used the reamer to ream down to subchondral  bone.  At this point, we did our peripheral hand reaming, drilled our central peg hole and impacted the metaglene baseplate into position.  We were able to get a 42 screw inferiorly, 24 at the base of the coracoid and 24 anteriorly.  We had great  baseplate security and then used a 42 standard glenosphere and impacted that and screwed that down  on the metaglene baseplate.  Once that was in place, I was able to do a finger sweep around the posterior aspect of the shoulder, making sure no soft  tissue was caught up between the  baseplate and the glenosphere.  Comfortable that was not the case, we directed our attention back toward the humeral side and then we reamed for the 2 right metaphysis placing the trial components into place, which was a  12 stem and the 2 right metaphysis set on the 0 setting and placed in 10 degrees of retroversion.  That had nice stability and support.  We then went ahead and reduced with a 42+3 poly and were happy with that soft tissue balancing.  We removed all trial  components from the humeral side.  We drilled small holes in the lesser tuberosity and placed #2 FiberWire suture x3 into the lesser tuberosity for repair of the subscapularis.  We irrigated thoroughly.  We then went ahead and used available bone graft  from the head in impaction grafting technique and impacted the real HA coated press-fit stem.  This was a 12 stem and 2 right metaphysis set on the 0 setting we impacted in 10 degrees of retroversion.  We had great stability of the stem.  We then  selected a real 42+3 poly insert and placed that in the humeral tray.  We then reduced the shoulder, had nice little pop as it reduced.  Appropriate tension, not too much, but no gapping with inferior pole or external rotation.  Next, we irrigated  thoroughly and repaired the subscapularis anatomically back to bone.  We then did the deltopectoral interval with 0 Vicryl suture followed by 2-0 Vicryl for subcutaneous closure and 4-0 Monocryl for skin.  Steri-Strips applied followed by sterile  dressing.  The patient tolerated the surgery well.  IN/NUANCE  D:03/26/2020 T:03/27/2020 JOB:[id number]

## 2020-03-29 ENCOUNTER — Encounter (HOSPITAL_COMMUNITY): Payer: Self-pay | Admitting: Orthopedic Surgery

## 2020-03-29 NOTE — Progress Notes (Signed)
[facility] Summary Report / Loop Recorder 

## 2020-04-08 DIAGNOSIS — M25511 Pain in right shoulder: Secondary | ICD-10-CM | POA: Diagnosis not present

## 2020-04-17 LAB — CUP PACEART REMOTE DEVICE CHECK
Date Time Interrogation Session: 20220128211723
Implantable Pulse Generator Implant Date: 20211122

## 2020-04-19 ENCOUNTER — Ambulatory Visit (INDEPENDENT_AMBULATORY_CARE_PROVIDER_SITE_OTHER): Payer: PPO

## 2020-04-19 DIAGNOSIS — I483 Typical atrial flutter: Secondary | ICD-10-CM | POA: Diagnosis not present

## 2020-04-27 DIAGNOSIS — N401 Enlarged prostate with lower urinary tract symptoms: Secondary | ICD-10-CM | POA: Diagnosis not present

## 2020-04-28 NOTE — Progress Notes (Signed)
[facility] Summary Report / Loop Recorder 

## 2020-05-11 DIAGNOSIS — Z4789 Encounter for other orthopedic aftercare: Secondary | ICD-10-CM | POA: Diagnosis not present

## 2020-05-13 DIAGNOSIS — Z961 Presence of intraocular lens: Secondary | ICD-10-CM | POA: Diagnosis not present

## 2020-05-13 DIAGNOSIS — H353131 Nonexudative age-related macular degeneration, bilateral, early dry stage: Secondary | ICD-10-CM | POA: Diagnosis not present

## 2020-05-13 DIAGNOSIS — H52203 Unspecified astigmatism, bilateral: Secondary | ICD-10-CM | POA: Diagnosis not present

## 2020-05-13 DIAGNOSIS — H10413 Chronic giant papillary conjunctivitis, bilateral: Secondary | ICD-10-CM | POA: Diagnosis not present

## 2020-05-24 ENCOUNTER — Ambulatory Visit (INDEPENDENT_AMBULATORY_CARE_PROVIDER_SITE_OTHER): Payer: PPO

## 2020-05-24 DIAGNOSIS — I77810 Thoracic aortic ectasia: Secondary | ICD-10-CM | POA: Diagnosis not present

## 2020-05-24 DIAGNOSIS — R82998 Other abnormal findings in urine: Secondary | ICD-10-CM | POA: Diagnosis not present

## 2020-05-24 DIAGNOSIS — E785 Hyperlipidemia, unspecified: Secondary | ICD-10-CM | POA: Diagnosis not present

## 2020-05-24 DIAGNOSIS — I483 Typical atrial flutter: Secondary | ICD-10-CM | POA: Diagnosis not present

## 2020-05-24 DIAGNOSIS — I700 Atherosclerosis of aorta: Secondary | ICD-10-CM | POA: Diagnosis not present

## 2020-05-24 DIAGNOSIS — I4510 Unspecified right bundle-branch block: Secondary | ICD-10-CM | POA: Diagnosis not present

## 2020-05-24 DIAGNOSIS — I10 Essential (primary) hypertension: Secondary | ICD-10-CM | POA: Diagnosis not present

## 2020-05-24 DIAGNOSIS — D692 Other nonthrombocytopenic purpura: Secondary | ICD-10-CM | POA: Diagnosis not present

## 2020-05-24 DIAGNOSIS — I712 Thoracic aortic aneurysm, without rupture: Secondary | ICD-10-CM | POA: Diagnosis not present

## 2020-05-24 DIAGNOSIS — J449 Chronic obstructive pulmonary disease, unspecified: Secondary | ICD-10-CM | POA: Diagnosis not present

## 2020-05-24 DIAGNOSIS — J479 Bronchiectasis, uncomplicated: Secondary | ICD-10-CM | POA: Diagnosis not present

## 2020-05-24 DIAGNOSIS — I2721 Secondary pulmonary arterial hypertension: Secondary | ICD-10-CM | POA: Diagnosis not present

## 2020-05-24 DIAGNOSIS — Z125 Encounter for screening for malignant neoplasm of prostate: Secondary | ICD-10-CM | POA: Diagnosis not present

## 2020-05-24 DIAGNOSIS — I4892 Unspecified atrial flutter: Secondary | ICD-10-CM | POA: Diagnosis not present

## 2020-05-24 DIAGNOSIS — Z0000 Encounter for general adult medical examination without abnormal findings: Secondary | ICD-10-CM | POA: Diagnosis not present

## 2020-05-25 ENCOUNTER — Encounter: Payer: PPO | Admitting: Cardiology

## 2020-05-25 LAB — CUP PACEART REMOTE DEVICE CHECK
Date Time Interrogation Session: 20220302211851
Implantable Pulse Generator Implant Date: 20211122

## 2020-05-31 DIAGNOSIS — R3915 Urgency of urination: Secondary | ICD-10-CM | POA: Diagnosis not present

## 2020-05-31 DIAGNOSIS — Z125 Encounter for screening for malignant neoplasm of prostate: Secondary | ICD-10-CM | POA: Diagnosis not present

## 2020-05-31 DIAGNOSIS — N401 Enlarged prostate with lower urinary tract symptoms: Secondary | ICD-10-CM | POA: Diagnosis not present

## 2020-05-31 DIAGNOSIS — R351 Nocturia: Secondary | ICD-10-CM | POA: Diagnosis not present

## 2020-06-02 NOTE — Progress Notes (Signed)
[facility] Summary Report / Loop Recorder 

## 2020-06-08 DIAGNOSIS — Z4789 Encounter for other orthopedic aftercare: Secondary | ICD-10-CM | POA: Diagnosis not present

## 2020-06-22 ENCOUNTER — Ambulatory Visit (INDEPENDENT_AMBULATORY_CARE_PROVIDER_SITE_OTHER): Payer: PPO

## 2020-06-22 DIAGNOSIS — I483 Typical atrial flutter: Secondary | ICD-10-CM

## 2020-06-22 DIAGNOSIS — I10 Essential (primary) hypertension: Secondary | ICD-10-CM | POA: Diagnosis not present

## 2020-06-22 DIAGNOSIS — R202 Paresthesia of skin: Secondary | ICD-10-CM | POA: Diagnosis not present

## 2020-06-22 DIAGNOSIS — G609 Hereditary and idiopathic neuropathy, unspecified: Secondary | ICD-10-CM | POA: Diagnosis not present

## 2020-06-22 DIAGNOSIS — M79605 Pain in left leg: Secondary | ICD-10-CM | POA: Diagnosis not present

## 2020-06-22 DIAGNOSIS — R200 Anesthesia of skin: Secondary | ICD-10-CM | POA: Diagnosis not present

## 2020-06-22 DIAGNOSIS — G4730 Sleep apnea, unspecified: Secondary | ICD-10-CM | POA: Diagnosis not present

## 2020-06-22 DIAGNOSIS — M545 Low back pain, unspecified: Secondary | ICD-10-CM | POA: Diagnosis not present

## 2020-06-22 DIAGNOSIS — M79604 Pain in right leg: Secondary | ICD-10-CM | POA: Diagnosis not present

## 2020-06-22 DIAGNOSIS — R278 Other lack of coordination: Secondary | ICD-10-CM | POA: Diagnosis not present

## 2020-06-22 LAB — CUP PACEART REMOTE DEVICE CHECK
Date Time Interrogation Session: 20220404211900
Implantable Pulse Generator Implant Date: 20211122

## 2020-06-24 DIAGNOSIS — M79605 Pain in left leg: Secondary | ICD-10-CM | POA: Diagnosis not present

## 2020-06-24 DIAGNOSIS — R200 Anesthesia of skin: Secondary | ICD-10-CM | POA: Diagnosis not present

## 2020-06-24 DIAGNOSIS — G4730 Sleep apnea, unspecified: Secondary | ICD-10-CM | POA: Diagnosis not present

## 2020-06-24 DIAGNOSIS — M545 Low back pain, unspecified: Secondary | ICD-10-CM | POA: Diagnosis not present

## 2020-06-24 DIAGNOSIS — I10 Essential (primary) hypertension: Secondary | ICD-10-CM | POA: Diagnosis not present

## 2020-06-24 DIAGNOSIS — M79604 Pain in right leg: Secondary | ICD-10-CM | POA: Diagnosis not present

## 2020-06-24 DIAGNOSIS — G609 Hereditary and idiopathic neuropathy, unspecified: Secondary | ICD-10-CM | POA: Diagnosis not present

## 2020-06-24 DIAGNOSIS — R278 Other lack of coordination: Secondary | ICD-10-CM | POA: Diagnosis not present

## 2020-06-24 DIAGNOSIS — R202 Paresthesia of skin: Secondary | ICD-10-CM | POA: Diagnosis not present

## 2020-06-25 DIAGNOSIS — M79604 Pain in right leg: Secondary | ICD-10-CM | POA: Diagnosis not present

## 2020-06-25 DIAGNOSIS — M79605 Pain in left leg: Secondary | ICD-10-CM | POA: Diagnosis not present

## 2020-06-25 DIAGNOSIS — M545 Low back pain, unspecified: Secondary | ICD-10-CM | POA: Diagnosis not present

## 2020-06-25 DIAGNOSIS — R202 Paresthesia of skin: Secondary | ICD-10-CM | POA: Diagnosis not present

## 2020-06-25 DIAGNOSIS — R200 Anesthesia of skin: Secondary | ICD-10-CM | POA: Diagnosis not present

## 2020-06-25 DIAGNOSIS — G4730 Sleep apnea, unspecified: Secondary | ICD-10-CM | POA: Diagnosis not present

## 2020-06-25 DIAGNOSIS — G609 Hereditary and idiopathic neuropathy, unspecified: Secondary | ICD-10-CM | POA: Diagnosis not present

## 2020-06-25 DIAGNOSIS — R278 Other lack of coordination: Secondary | ICD-10-CM | POA: Diagnosis not present

## 2020-06-25 DIAGNOSIS — I10 Essential (primary) hypertension: Secondary | ICD-10-CM | POA: Diagnosis not present

## 2020-06-28 DIAGNOSIS — I10 Essential (primary) hypertension: Secondary | ICD-10-CM | POA: Diagnosis not present

## 2020-06-28 DIAGNOSIS — M79605 Pain in left leg: Secondary | ICD-10-CM | POA: Diagnosis not present

## 2020-06-28 DIAGNOSIS — R202 Paresthesia of skin: Secondary | ICD-10-CM | POA: Diagnosis not present

## 2020-06-28 DIAGNOSIS — G609 Hereditary and idiopathic neuropathy, unspecified: Secondary | ICD-10-CM | POA: Diagnosis not present

## 2020-06-28 DIAGNOSIS — M545 Low back pain, unspecified: Secondary | ICD-10-CM | POA: Diagnosis not present

## 2020-06-28 DIAGNOSIS — R278 Other lack of coordination: Secondary | ICD-10-CM | POA: Diagnosis not present

## 2020-06-28 DIAGNOSIS — G4730 Sleep apnea, unspecified: Secondary | ICD-10-CM | POA: Diagnosis not present

## 2020-06-28 DIAGNOSIS — R200 Anesthesia of skin: Secondary | ICD-10-CM | POA: Diagnosis not present

## 2020-06-28 DIAGNOSIS — M79604 Pain in right leg: Secondary | ICD-10-CM | POA: Diagnosis not present

## 2020-06-30 DIAGNOSIS — R202 Paresthesia of skin: Secondary | ICD-10-CM | POA: Diagnosis not present

## 2020-06-30 DIAGNOSIS — R278 Other lack of coordination: Secondary | ICD-10-CM | POA: Diagnosis not present

## 2020-06-30 DIAGNOSIS — I10 Essential (primary) hypertension: Secondary | ICD-10-CM | POA: Diagnosis not present

## 2020-06-30 DIAGNOSIS — M79604 Pain in right leg: Secondary | ICD-10-CM | POA: Diagnosis not present

## 2020-06-30 DIAGNOSIS — G609 Hereditary and idiopathic neuropathy, unspecified: Secondary | ICD-10-CM | POA: Diagnosis not present

## 2020-06-30 DIAGNOSIS — M545 Low back pain, unspecified: Secondary | ICD-10-CM | POA: Diagnosis not present

## 2020-06-30 DIAGNOSIS — M79605 Pain in left leg: Secondary | ICD-10-CM | POA: Diagnosis not present

## 2020-06-30 DIAGNOSIS — G4730 Sleep apnea, unspecified: Secondary | ICD-10-CM | POA: Diagnosis not present

## 2020-06-30 DIAGNOSIS — R200 Anesthesia of skin: Secondary | ICD-10-CM | POA: Diagnosis not present

## 2020-07-01 ENCOUNTER — Ambulatory Visit: Payer: PPO | Admitting: Internal Medicine

## 2020-07-01 ENCOUNTER — Other Ambulatory Visit: Payer: Self-pay

## 2020-07-01 ENCOUNTER — Encounter: Payer: Self-pay | Admitting: Internal Medicine

## 2020-07-01 VITALS — BP 120/72 | HR 71 | Temp 98.0°F | Ht 76.0 in | Wt 270.1 lb

## 2020-07-01 DIAGNOSIS — R490 Dysphonia: Secondary | ICD-10-CM

## 2020-07-01 DIAGNOSIS — J449 Chronic obstructive pulmonary disease, unspecified: Secondary | ICD-10-CM | POA: Diagnosis not present

## 2020-07-01 DIAGNOSIS — M545 Low back pain, unspecified: Secondary | ICD-10-CM | POA: Diagnosis not present

## 2020-07-01 DIAGNOSIS — Z836 Family history of other diseases of the respiratory system: Secondary | ICD-10-CM

## 2020-07-01 DIAGNOSIS — R053 Chronic cough: Secondary | ICD-10-CM | POA: Diagnosis not present

## 2020-07-01 DIAGNOSIS — R278 Other lack of coordination: Secondary | ICD-10-CM | POA: Diagnosis not present

## 2020-07-01 DIAGNOSIS — M79604 Pain in right leg: Secondary | ICD-10-CM | POA: Diagnosis not present

## 2020-07-01 DIAGNOSIS — M79605 Pain in left leg: Secondary | ICD-10-CM | POA: Diagnosis not present

## 2020-07-01 DIAGNOSIS — R202 Paresthesia of skin: Secondary | ICD-10-CM | POA: Diagnosis not present

## 2020-07-01 DIAGNOSIS — G609 Hereditary and idiopathic neuropathy, unspecified: Secondary | ICD-10-CM | POA: Diagnosis not present

## 2020-07-01 DIAGNOSIS — I10 Essential (primary) hypertension: Secondary | ICD-10-CM | POA: Diagnosis not present

## 2020-07-01 DIAGNOSIS — R200 Anesthesia of skin: Secondary | ICD-10-CM | POA: Diagnosis not present

## 2020-07-01 DIAGNOSIS — G4730 Sleep apnea, unspecified: Secondary | ICD-10-CM | POA: Diagnosis not present

## 2020-07-01 MED ORDER — BREZTRI AEROSPHERE 160-9-4.8 MCG/ACT IN AERO: 2.0000 | INHALATION_SPRAY | Freq: Two times a day (BID) | RESPIRATORY_TRACT | 0 refills | Status: DC

## 2020-07-01 NOTE — Patient Instructions (Addendum)
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   - if breztri works better - call us  - test ono on room air - do simple walking desaturation test  Shortness of breath - out of proportion to lung findings  -Likely drug due to weight gain, visceral obesity, loss of physical fitness and diastolic dysfunction which is stiff heart muscle  - stable since last visit  Plan  -Hold off on pulmonary stress test -Encourage reentry into physical activity and weight loss - dp full PFT in 6 months   Chronic cough - again out of proportion to lung fnmdings Could be element of cough neuropathy Hoarse voice quality - -Previously seen Dr Elgie Congo  Dry mouth  - already on gabapentin for peripheral neuropathy - declined voice rehab  Follow-up -expectant approach

## 2020-07-01 NOTE — Progress Notes (Signed)
IOV 08/24/2017  Chief Complaint  Patient presents with  . Consult    Referred by Dr. Antonino Hedge due to trouble coughing, coughing up mucus that is dark gray in color, and breathing has become worse. Pt also has c/o hoarseness that began 6 months ago. Pt's main complaint is labored breathing. Pt states he had a brother who died of pulmonary fibrosis.    Noah Huffman , 80 y.o. , with dob Mar 21, 1940 and male ,Not Hispanic or Latino from [address] [place] [address] - presents to pulm clinic for dyspnea and cough.  He is originally from [place] area and moved to [place] in the late 1960s and in [place] since the 1970s.His brother died at age 70 from pulmonary fibrosis lasted at the [facility] he is also worried about that.  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  He smoked heavily over 62 pack smoking history but quit over 25 years ago.  He tells me for the last 4 to 5 years he has had shortness of breath that is slowly progre  ssive.  Definitely present with exertion relieved by rest.  Walking today in our office 185 feet x 3 laps on room air he did have some mild shortness of breath but did not desaturate.  There is no associated chest pain.    He does have a chronic cough that for which she was started on Symbicort few to several years ago and it did seem to help but in the last year or 2 the cough is deteriorated and associated with some yellow sputum production.  He does have some on and off hoarseness of voice that is constant for the last few years.  He recollects having had a ENT evaluation a few years ago that was normal.  He clears his throat.  There is excess feeling of postnasal drip there is some occasional choking episodes he finds the cough annoying and there is an occasional sensation of something sticking in his throat but there is no heartburn or dysphagia or coughing after lying down.  His RSI cough score  is 19.  He is noticed to be on losartan  In 2017 he did have a plain old exercise treadmill test that was normal.   FenO 08/24/2017 = 22 ppb on spiriva and symbicort.  Recently started on Symbicort in the last few months by Dr. Fredderick Phenix allergist.  Patient tells me that allergy testing was negative/normal    In 2014 he had spirometry that shows both reduced FVC and FEV1 with a reduced ratio slightly favoring obstruction  He had a CT chest aug 2018 that shows some bibasilar atelectasis this was a CT chest angiography.  In 2017 he had a CT abdomen pelvis that does not show these applications.  I personally visualized the CT chest and confirmed the findings.    OV 10/08/2017  Chief Complaint  Patient presents with  . Follow-up    PFT done today, clears throat,    Follow-up cough and shortness of breath. He continues to be is on Spiriva and Symbicort. In the interim his symptoms of improvement. He only function test today and his FEV1 is 71% associated with moderate obstruction. DLCO is mildly reduced. Overall he feels great. We discussed pulmonary rehabilitation but he says he will exercise on his own and will think about attending rehabilitation center if x-ray does not help. He had high resolution CT chest  that I personally visualized and it shows bilateral lower lobe bronchiectasis Currently no sputum production.  OV 01/01/2019  Subjective:  Patient ID: Noah Huffman, male , DOB: January 13, 1941 , age 65 y.o. , MRN: [id number] , ADDRESS: [place] [address]   01/01/2019 -   Chief Complaint  Patient presents with  . Bronchiectasis without complication    Patient said there are times when the breathing is worse, especially when hot. Recently had chest CT and was told to some to clinic to discuss.   Follow-up bilateral lower lobe bronchiectasis on Spiriva and Symbicort but with a family history of pulmonary fibrosis.  [His brother who is 5 years younger than him died in  06/17/15 at the age of 64 from pulmonary fibrosis]  HPI Noah Huffman 80 y.o. -presents for follow-up.  Last seen in July 2019.  Since then he has been on Spiriva and Symbicort.  He says the shortness of breath is stable but his chronic cough with sputum production has gotten worse a little bit especially early in the morning.  He is worried he might have pulmonary fibrosis because his brother died from it.  He also had a recent CT chest of the aorta that reported the bronchiectasis.  Therefore his allergist Dr. Fredderick Phenix is asked him to be reevaluated with [place].  Otherwise no new problems.  The only other health issue in the last 1 year is his left hip pain.        OV 01/28/2019  Subjective:  Patient ID: Noah Huffman, male , DOB: February 10, 1941 , age 33 y.o. , MRN: [id number] , ADDRESS: [place] [address]   01/28/2019 -   Chief Complaint  Patient presents with  . Follow-up    PFT and CT chest performed since last visit and pt is here today to go over the results. Pt states his cough is still the same since last visit.   Follow-up bilateral lower lobe bronchiectasis on Spiriva and Symbicort but with a family history of pulmonary fibrosis.  [His brother who is 5 years younger than him died in 2015-06-17 at the age of 44 from pulmonary fibrosis]  HPI Noah Huffman 80 y.o. -returns for follow-up of her pulmonary function testing and high-resolution CT chest.  The high-resolution CT chest is again described as bronchiectasis in the bilateral lower lobes.  To me it looks a little bit like reticulation on my personal visualization and I wonder if there is early fibrosis.  Of note, somebody in radiology told him that he had pulmonary fibrosis and he got upset.  He is very scared about getting pulmonary fibrosis because his brother died from the same.  Regardless the level of radiologic abnormality appears to be very mild.  His pulmonary function test itself is stable compared to a year  ago.  However his symptoms appear to be out of proportion to the pulmonary findings on the CT scan.  He tells me that his cough is severe RSI cough score is 27.  He clears his throat a lot.  He feels like there is lot of phlegm in the throat.  When I asked him if there is a sensation there is a "frog in the throat" he did replied in the affirmative.  He clears her throat a lot.  He did that even during the office visit.  But the cough is stable compared to a year ago  His shortness of breath he states is worse.  Class  III on exertion relieved by rest.  He has significant visceral obesity.  He says his been tested for sleep apnea 3 times in the past but this has been negative.  His last cardiac stress test was June 19, 2015 a plain old treadmill test that I reviewed the result and was normal.  It has been several years since he had an echocardiogram.  He is willing to have another echocardiogram and if this is normal undergo pulmonary stress test.  He is reporting new onset of hoarseness of voice for the last 1 year.  It seems to be intermittent.  He states his wife is also getting it.  He has previous history of smoking.  Has been on Symbicort for 7 years and Spiriva recently.  He has not seen ENT in several years or never seen them at all.    CT chest high resolution  IMPRESSION: 1. No evidence of interstitial lung disease. 2. Mild bibasilar bronchiectasis and scarring, as before. 3. Aortic atherosclerosis (ICD10-170.0). Coronary artery calcification.   Electronically Signed   By: Lorin Picket M.D.   On: 01/24/2019 16:29  OV 04/09/2019  Subjective:  Patient ID: Noah Huffman, male , DOB: 08-01-1940 , age 75 y.o. , MRN: [id number] , ADDRESS: [address] [place] [address]  Follow-up bilateral lower lobe bronchiectasis on Spiriva and Symbicort but with a family history of pulmonary fibrosis.  [His brother who is 5 years younger than him died in June 19, 2015 at the age of 106 from pulmonary  fibrosis]   04/09/2019 -   Chief Complaint  Patient presents with  . Follow-up    f/u bronchiectasis, minimal cough with phlegm with cloudy mucus     HPI Noah Huffman 80 y.o. -presents for his follow-up of multiple issues.  He is on Spiriva and Symbicort but despite this he has significant amount of cough he is clearing his throat all the time including even at this visit.  He says he has associated dry mouth.  He is already on pre-existing gabapentin.  Med review shows losartan.  He saw Dr. Lorelee Cover and ENT after my referral to him at the last visit.  Apparently has dry mouth and the trying some humidification.  He is some better with his cough.  His chronic shortness of breath continues.  He had an echocardiogram that shows diastolic dysfunction.  He now tells me that because of the pandemic he stopped exercising at the [facility] and he regained 20 pounds and this then contributed the shortness of breath.  He initially agreed for pulmonary stress test but after discussing this with him we think the most likely etiology is obesity, diastolic dysfunction physical deconditioning therefore going to hold off.  I discussed his bilateral lower lobe bronchiectasis at the case conference.  He is very concerned about pulmonary fibrosis because his brother passed away from it.  He also has a previous 60 pack smoking and therefore he is a high risk candidate for pulmonary fibrosis especially being male, Caucasian and age greater than 35.  However the case conference it was resolved that he only had bronchiectasis and no evidence of fibrosis.  Review of the labs indicate that he still needs to have bronchiectasis work-up    OV 01/14/2020   Subjective:  Patient ID: Noah Huffman, male , DOB: 1940-06-14, age 38 y.o. years. , MRN: [id number],  ADDRESS: [place] [address] PCP  [name], MD Providers : Treatment Team:  Attending Provider: Brand Males, MD Patient  Care Team: [name], MD as PCP - General (Internal Medicine) Vickie Epley, MD as PCP - Electrophysiology (Cardiology)    Chief Complaint  Patient presents with  . Follow-up    worsening chest discomfort over last 2 months, incrfeased pain with sneezing     Follow-up bilateral lower lobe bronchiectasis on Spiriva and Symbicort but with a family history of pulmonary fibrosis.  [His brother who is 5 years younger than him died in 2015/06/23 at the age of 39 from pulmonary fibrosiAnd a first cousin in 2021s ]    HPI Noah Huffman 54 y.o. -presents for follow-up.  Last seen in early part of the year.  Since then overall shortness of breath and wheezing and cough are stable.  He says he has had atrial flutter and then dealing with urinary tract infections.  He has appointment with his urologist.  He does not believe he is ever taken [place].  He is now on Eliquis.  His right shoulder is bothering him and he plans to have surgery.  In terms of shortness of breath cough and wheezing these are stable.  But he tells me that he has since spring 2021 this intermittent pain in the right supramammary/infraclavicular area.  This is dull and constant.  He thinks it might be related to the shoulder but he is worried it might be related to the lungs.  He tells me that this year another first cousin died from pulmonary fibrosis he is really worried that he might be getting pulmonary fibrosis.   Earlier in the year his autoimmune antibodies were negative but blood IgE was slightly high he still continue Spiriva and Symbicort which he thinks is helping him.  He wants refills for this.   Dr Lorenza Cambridge Reflux Symptom Index (> 13-15 suggestive of LPR cough)  01/28/2019   Hoarseness of problem with voice 4  Clearing  Of Throat 4  Excess throat mucus or feeling of post nasal drip 4  Difficulty swallowing food, liquid or tablets 1  Cough after eating or lying down 3  Breathing difficulties or choking episodes 3  Troublesome  or annoying cough 2  Sensation of something sticking in throat or lump in throat 3  Heartburn, chest pain, indigestion, or stomach acid coming up 3  TOTAL 27     Simple office walk 185 feet x  3 laps goal with forehead probe 08/24/2017   O2 used Room air  Number laps completed 3  Comments about pace Normal pace,, tall man,   Resting Pulse Ox/HR 98% and 61/min  Final Pulse Ox/HR 96% and 88/min  Desaturated </= 88% no  Desaturated <= 3% points no  Got Tachycardic >/= 90/min no  Symptoms at end of test Mild dyspneic  Miscellaneous comments x   Results for Noah Huffman, Noah Huffman (MRN [id number]) as of 01/28/2019 10:58  Ref. Range 10/08/2017 11:44 01/21/2019 13:47  FVC-Pre Latest Units: L 4.03 3.73  FVC-%Pred-Pre Latest Units: % 79 74  Results for Noah Huffman, Noah Huffman (MRN [id number]) as of 01/28/2019 10:58  Ref. Range 10/08/2017 11:44 01/21/2019 13:47  TLC Latest Units: L 6.82 7.91  TLC % pred Latest Units: % 84 98  Results for Noah Huffman, Noah Huffman (MRN [id number]) as of 01/28/2019 10:58  Ref. Range 10/08/2017 11:44 01/21/2019 13:47  DLCO unc Latest Units: ml/min/mmHg 28.61 25.55  DLCO unc % pred Latest Units: % 73 88    PFT Results Latest Ref Rng & Units 01/21/2019 10/08/2017  FVC-Pre L 3.73 4.03  FVC-Predicted Pre % 74 79  FVC-Post L 3.69 3.68  FVC-Predicted Post % 73 72  Pre FEV1/FVC % % 64 71  Post FEV1/FCV % % 70 72  FEV1-Pre L 2.40 2.85  FEV1-Predicted Pre % 65 77  FEV1-Post L 2.58 2.63  DLCO uncorrected ml/min/mmHg 25.55 28.61  DLCO UNC% % 88 73  DLVA Predicted % 108 83  TLC L 7.91 6.82  TLC % Predicted % 98 84  RV % Predicted % 139 91    OV 07/01/2020  Subjective:  Patient ID: Noah Huffman, male , DOB: Jan 15, 1941 , age 26 y.o. , MRN: [id number] , ADDRESS: [place] [address] PCP [name], MD Patient Care Team: [name], MD as PCP - General (Internal Medicine) Vickie Epley, MD as PCP - Electrophysiology (Cardiology)  This Provider for this  visit: Treatment Team:  Attending Provider: Brand Males, MD   Follow-up bilateral lower lobe bronchiectasis on Spiriva and Symbicort but with a family history of pulmonary fibrosis.  [His brother who is 5 years younger than him died in 06-24-15 at the age of 60 from pulmonary fibrosiAnd a first cousin in 2021s ]. No evidence of personal pulmonary fibrosis based on multidisciplinary case conference in the [facility] case conference December 2020. CT nov 2021 with evidence of bronchitis  Negative serology early 06-24-19 except mild elevation IgE  07/01/2020 -   Chief Complaint  Patient presents with  . Follow-up    Still winded with exertion, coughing with grayish mucous     HPI Noah Huffman 80 y.o. -returns for follow-up.  Overall he reports stability but still having significant amount of chronic cough with clearing of the throat.  We discussed this.  He does not want it in voice rehabilitation.  He is already on gabapentin for his peripheral neuropathy.  He has seen ENT in the last few years.  Does not want to see them again.  He is wondering if Spiriva and Symbicort is contributory to this.  I did acknowledge this is a possibility.  Discussed nonpharmaceutical interventions but at this point he does not want to attend voice rehabilitation classes.  In terms of shortness of breath this is stable.  His last CT scan of the chest was in November 2021 and again radiologist denied there was pulmonary fibrosis but he has evidence of chronic bronchitis and bronchiectasis.  We discussed about inhaler rotation to a triple inhaler combined inhaler.  He is open to this idea.  He is now finished his shoulder surgery.  He is wondering about his oxygen levels.  He says he might be hypoxemic because of his lung disease.  Apparently one of his physicians told him that his peripheral neuropathy [because he does not have diabetes] could be because of frostbite sustained in childhood or because of hypoxemia.  In 23-Jun-2017 his  walking desaturation test was normal.  He is willing to get overnight oxygen study done.  Today's walking desaturation test is acceptable and documented below.    Dr Lorenza Cambridge Reflux Symptom Index (> 13-15 suggestive of LPR cough)  01/28/2019   Hoarseness of problem with voice 4  Clearing  Of Throat 4  Excess throat mucus or feeling of post nasal drip 4  Difficulty swallowing food, liquid or tablets 1  Cough after eating or lying down 3  Breathing difficulties or choking episodes 3  Troublesome or annoying cough 2  Sensation of something sticking in throat or lump in throat 3  Heartburn, chest  pain, indigestion, or stomach acid coming up 3  TOTAL 27     Simple office walk 185 feet x  3 laps goal with forehead probe 08/24/2017  07/01/2020   O2 used Room air ra  Number laps completed 3 2 stopped due to feet pain  Comments about pace Normal pace,, tall man,   slow pace  Resting Pulse Ox/HR 98% and 61/min  94% at rest heart rate 88/min  Final Pulse Ox/HR 96% and 88/min  93% at rest and 92/min no shortness of breath but stopped due to feet pain  Desaturated </= 88% no   Desaturated <= 3% points no   Got Tachycardic >/= 90/min no   Symptoms at end of test Mild dyspneic   Miscellaneous comments x       Lab Results  Component Value Date   NITRICOXIDE 22 08/24/2017      HRCT Nov 2021   IMPRESSION: 1. No significant change in mild, bland appearing scarring and volume loss of the left greater than right lung bases. No specific evidence of fibrotic interstitial lung disease. 2. Mild, diffuse bilateral bronchial wall thickening, consistent with nonspecific infectious or inflammatory bronchitis. 3. Coronary artery disease.  Aortic Atherosclerosis (ICD10-I70.0).   Electronically Signed   By: Eddie Candle M.D.   On: 01/30/2020 13:25  PFT  PFT Results Latest Ref Rng & Units 03/15/2020 01/21/2019 10/08/2017  FVC-Pre L 3.44 3.73 4.03  FVC-Predicted Pre % 69 74 79  FVC-Post L  3.37 3.69 3.68  FVC-Predicted Post % 67 73 72  Pre FEV1/FVC % % 73 64 71  Post FEV1/FCV % % 73 70 72  FEV1-Pre L 2.51 2.40 2.85  FEV1-Predicted Pre % 69 65 77  FEV1-Post L 2.45 2.58 2.63  DLCO uncorrected ml/min/mmHg 28.29 25.55 28.61  DLCO UNC% % 98 88 73  DLCO corrected ml/min/mmHg 28.29 - -  DLCO COR %Predicted % 98 - -  DLVA Predicted % 108 108 83  TLC L 7.20 7.91 6.82  TLC % Predicted % 89 98 84  RV % Predicted % 104 139 91       has a past medical history of Allergy, Asthma, Atrial fibrillation (HCC), BPH (benign prostatic hypertrophy) with urinary obstruction, Cataract, COPD (chronic obstructive pulmonary disease) ([place]), Diverticulosis, Dysrhythmia, GERD (gastroesophageal reflux disease), Hyperglycemia, Hyperlipidemia, Hypertension, Inguinal hernia, Neuromuscular disorder ([place]), Obesity, OSA (obstructive sleep apnea) (06/06/2013), Osteoarthritis, and Sleep apnea.   reports that he quit smoking about 32 years ago. His smoking use included cigarettes. He has a 62.50 pack-year smoking history. He has never used smokeless tobacco.  Past Surgical History:  Procedure Laterality Date  . A-FLUTTER ABLATION N/A 01/02/2020   Procedure: A-FLUTTER ABLATION;  Surgeon: Vickie Epley, MD;  Location: [facility];  Service: Cardiovascular;  Laterality: N/A;  . APPENDECTOMY    . arthroscopic knee surgery    . CATARACT EXTRACTION  2012   bilateral  . COLONOSCOPY    . HAMMER TOE SURGERY    . INGUINAL HERNIA REPAIR     right  . POLYPECTOMY    . REVERSE SHOULDER ARTHROPLASTY Right 03/26/2020   Procedure: REVERSE SHOULDER ARTHROPLASTY;  Surgeon: Netta Cedars, MD;  Location: [facility];  Service: Orthopedics;  Laterality: Right;  interscalane block  . TONSILLECTOMY AND ADENOIDECTOMY    . TOTAL HIP ARTHROPLASTY  2007   right    Allergies  Allergen Reactions  . Macrobid [Nitrofurantoin]   . Morphine And Related Rash    Immunization History  Administered  Date(s) Administered  .  Fluad Quad(high Dose 65+) 12/02/2018, 12/24/2019  . Influenza Split 04/13/2011, 03/20/2012, 04/18/2012  . Influenza, Quadrivalent, Recombinant, Inj, Pf 12/24/2019  . Influenza,inj,Quad PF,6+ Mos 01/14/2017  . Influenza,inj,quad, With Preservative 12/11/2018  . PFIZER(Purple Top)SARS-COV-2 Vaccination 05/15/2019, 06/10/2019  . Pneumococcal Conjugate-13 02/28/2013, 04/21/2013, 05/06/2015  . Pneumococcal Polysaccharide-23 01/23/2007, 05/09/2016  . Td 03/21/2004, 08/25/2013, 08/25/2013  . Unspecified SARS-COV-2 Vaccination 05/21/2019    Family History  Problem Relation Age of Onset  . Leukemia Father   . Transient ischemic attack Father   . Pneumonia Father   . Colon cancer Mother 90  . Brain cancer Brother   . Pulmonary fibrosis Brother   . Colon polyps Neg Hx   . Esophageal cancer Neg Hx   . Rectal cancer Neg Hx   . Stomach cancer Neg Hx      Current Outpatient Medications:  .  albuterol (VENTOLIN HFA) 108 (90 Base) MCG/ACT inhaler, Inhale 2 puffs into the lungs every 4 (four) hours as needed for wheezing or shortness of breath. , Disp: , Rfl:  .  budesonide-formoterol (SYMBICORT) 160-4.5 MCG/ACT inhaler, INHALE 2 PUFFS BY MOUTH TWICE DAILY AS NEEDED (Patient taking differently: Inhale 2 puffs into the lungs in the morning and at bedtime.), Disp: 10.2 g, Rfl: 5 .  fluticasone (FLONASE) 50 MCG/ACT nasal spray, Place 1 spray into both nostrils daily., Disp: , Rfl:  .  gabapentin (NEURONTIN) 600 MG tablet, Take 1,800 mg by mouth 3 (three) times daily., Disp: , Rfl:  .  losartan-hydrochlorothiazide (HYZAAR) 100-25 MG tablet, Take 1 tablet by mouth daily., Disp: , Rfl:  .  Multiple Vitamins-Minerals (PRESERVISION AREDS 2) CAPS, Take 1 capsule by mouth daily. , Disp: , Rfl:  .  polyvinyl alcohol (LIQUIFILM TEARS) 1.4 % ophthalmic solution, Place 1 drop into both eyes 2 (two) times daily., Disp: , Rfl:  .  rosuvastatin (CRESTOR) 10 MG tablet, Take 10 mg by mouth daily. , Disp: , Rfl:  .   sodium chloride (OCEAN) 0.65 % SOLN nasal spray, Place 1 spray into both nostrils daily., Disp: , Rfl:  .  tamsulosin (FLOMAX) 0.4 MG CAPS capsule, Take 0.4 mg by mouth daily., Disp: , Rfl:  .  Tiotropium Bromide Monohydrate (SPIRIVA RESPIMAT) 2.5 MCG/ACT AERS, Inhale 2 puffs into the lungs daily., Disp: 1 each, Rfl: 5  Current Facility-Administered Medications:  .  0.9 %  sodium chloride infusion, 500 mL, Intravenous, Once, Irene Shipper, MD      Objective:   Vitals:   07/01/20 0934  BP: 120/72  Pulse: 71  Temp: 98 F (36.7 C)  TempSrc: Oral  SpO2: 93%  Weight: 270 lb 1.6 oz (122.5 kg)  Height: 6\' 4"  (1.93 m)    Estimated body mass index is 32.88 kg/m as calculated from the following:   Height as of this encounter: 6\' 4"  (1.93 m).   Weight as of this encounter: 270 lb 1.6 oz (122.5 kg).  @WEIGHTCHANGE @  [employer]   07/01/20 0934  Weight: 270 lb 1.6 oz (122.5 kg)     Physical Exam General: No distress. obese Neuro: Alert and Oriented x 3. GCS 15. Speech normal Psych: Pleasant Resp:  Barrel Chest - no.  Wheeze - no, Crackles - yes at base, No overt respiratory distress CVS: Normal heart sounds. Murmurs - no Ext: Stigmata of Connective Tissue Disease - no HEENT: Normal upper airway. PEERL +. No post nasal drip        Assessment:  ICD-10-CM   1. COPD GOLD II  J44.9   2. Family history of pulmonary fibrosis  Z83.6   3. Chronic cough  R05.3   4. Hoarse voice quality  R49.0        Plan:     Patient Instructions  Family history of pulmonary fibrosis Bronchiectasis without complication ([place]) Previous 60 pack smoking history New onset Rt infra-clavicular/supra mammary chest discomfort since April 2021  -Per case conference in December 2020 there is no evidence of pulmonary fibrosis but you have bronchiectasis and chronic bronchitis - glad shortnes of breath, cough and wheezing are stable thought is frustrating it is still present - goal of care is  symptoim optimization and monitoring for development of pulmonary fibrosis  Plan -take 8 week sample of breztri  - during this time hold Spiriva and Symbicort .   - if breztri works better - call us  - test ono on room air - do simple walking desaturation test  Shortness of breath - out of proportion to lung findings  -Likely drug due to weight gain, visceral obesity, loss of physical fitness and diastolic dysfunction which is stiff heart muscle  - stable since last visit  Plan  -Hold off on pulmonary stress test -Encourage reentry into physical activity and weight loss - dp full PFT in 6 months   Chronic cough - again out of proportion to lung fnmdings Could be element of cough neuropathy Hoarse voice quality - -Previously seen Dr Elgie Congo  Dry mouth  - already on gabapentin for peripheral neuropathy - declined voice rehab  Follow-up -expectant approach     SIGNATURE    Dr. Brand Males, M.D., F.C.C.P,  Pulmonary and Critical Care Medicine Staff Physician, [place] Director - Interstitial Lung Disease  Program  [facility] [place] [facility] [place], [place], [address]  Pager: [phone], If no answer or between  15:00h - 7:00h: call [phone] Telephone: [phone]  10:11 AM 07/01/2020

## 2020-07-01 NOTE — Addendum Note (Signed)
Addended byCoralie Keens on: 07/01/2020 10:22 AM   Modules accepted: Orders

## 2020-07-01 NOTE — Addendum Note (Signed)
Addended byCoralie Keens on: 07/01/2020 12:50 PM   Modules accepted: Orders

## 2020-07-01 NOTE — Addendum Note (Signed)
Addended byCoralie Keens on: 07/01/2020 12:52 PM   Modules accepted: Orders

## 2020-07-02 NOTE — Progress Notes (Signed)
[facility] Summary Report / Loop Recorder 

## 2020-07-05 ENCOUNTER — Ambulatory Visit: Payer: PPO | Admitting: Internal Medicine

## 2020-07-06 DIAGNOSIS — I10 Essential (primary) hypertension: Secondary | ICD-10-CM | POA: Diagnosis not present

## 2020-07-06 DIAGNOSIS — R200 Anesthesia of skin: Secondary | ICD-10-CM | POA: Diagnosis not present

## 2020-07-06 DIAGNOSIS — R202 Paresthesia of skin: Secondary | ICD-10-CM | POA: Diagnosis not present

## 2020-07-06 DIAGNOSIS — M79605 Pain in left leg: Secondary | ICD-10-CM | POA: Diagnosis not present

## 2020-07-06 DIAGNOSIS — M545 Low back pain, unspecified: Secondary | ICD-10-CM | POA: Diagnosis not present

## 2020-07-06 DIAGNOSIS — G609 Hereditary and idiopathic neuropathy, unspecified: Secondary | ICD-10-CM | POA: Diagnosis not present

## 2020-07-06 DIAGNOSIS — R278 Other lack of coordination: Secondary | ICD-10-CM | POA: Diagnosis not present

## 2020-07-06 DIAGNOSIS — M79604 Pain in right leg: Secondary | ICD-10-CM | POA: Diagnosis not present

## 2020-07-06 DIAGNOSIS — G4730 Sleep apnea, unspecified: Secondary | ICD-10-CM | POA: Diagnosis not present

## 2020-07-07 DIAGNOSIS — M79605 Pain in left leg: Secondary | ICD-10-CM | POA: Diagnosis not present

## 2020-07-07 DIAGNOSIS — I10 Essential (primary) hypertension: Secondary | ICD-10-CM | POA: Diagnosis not present

## 2020-07-07 DIAGNOSIS — R0683 Snoring: Secondary | ICD-10-CM | POA: Diagnosis not present

## 2020-07-07 DIAGNOSIS — M545 Low back pain, unspecified: Secondary | ICD-10-CM | POA: Diagnosis not present

## 2020-07-07 DIAGNOSIS — G609 Hereditary and idiopathic neuropathy, unspecified: Secondary | ICD-10-CM | POA: Diagnosis not present

## 2020-07-07 DIAGNOSIS — R200 Anesthesia of skin: Secondary | ICD-10-CM | POA: Diagnosis not present

## 2020-07-07 DIAGNOSIS — G4730 Sleep apnea, unspecified: Secondary | ICD-10-CM | POA: Diagnosis not present

## 2020-07-07 DIAGNOSIS — R202 Paresthesia of skin: Secondary | ICD-10-CM | POA: Diagnosis not present

## 2020-07-07 DIAGNOSIS — M79604 Pain in right leg: Secondary | ICD-10-CM | POA: Diagnosis not present

## 2020-07-07 DIAGNOSIS — R278 Other lack of coordination: Secondary | ICD-10-CM | POA: Diagnosis not present

## 2020-07-09 DIAGNOSIS — M545 Low back pain, unspecified: Secondary | ICD-10-CM | POA: Diagnosis not present

## 2020-07-09 DIAGNOSIS — G4730 Sleep apnea, unspecified: Secondary | ICD-10-CM | POA: Diagnosis not present

## 2020-07-09 DIAGNOSIS — M79605 Pain in left leg: Secondary | ICD-10-CM | POA: Diagnosis not present

## 2020-07-09 DIAGNOSIS — I10 Essential (primary) hypertension: Secondary | ICD-10-CM | POA: Diagnosis not present

## 2020-07-09 DIAGNOSIS — R200 Anesthesia of skin: Secondary | ICD-10-CM | POA: Diagnosis not present

## 2020-07-09 DIAGNOSIS — R202 Paresthesia of skin: Secondary | ICD-10-CM | POA: Diagnosis not present

## 2020-07-09 DIAGNOSIS — R278 Other lack of coordination: Secondary | ICD-10-CM | POA: Diagnosis not present

## 2020-07-09 DIAGNOSIS — G609 Hereditary and idiopathic neuropathy, unspecified: Secondary | ICD-10-CM | POA: Diagnosis not present

## 2020-07-09 DIAGNOSIS — M79604 Pain in right leg: Secondary | ICD-10-CM | POA: Diagnosis not present

## 2020-07-12 DIAGNOSIS — R278 Other lack of coordination: Secondary | ICD-10-CM | POA: Diagnosis not present

## 2020-07-12 DIAGNOSIS — M79604 Pain in right leg: Secondary | ICD-10-CM | POA: Diagnosis not present

## 2020-07-12 DIAGNOSIS — G609 Hereditary and idiopathic neuropathy, unspecified: Secondary | ICD-10-CM | POA: Diagnosis not present

## 2020-07-12 DIAGNOSIS — M545 Low back pain, unspecified: Secondary | ICD-10-CM | POA: Diagnosis not present

## 2020-07-12 DIAGNOSIS — R202 Paresthesia of skin: Secondary | ICD-10-CM | POA: Diagnosis not present

## 2020-07-12 DIAGNOSIS — I10 Essential (primary) hypertension: Secondary | ICD-10-CM | POA: Diagnosis not present

## 2020-07-12 DIAGNOSIS — R200 Anesthesia of skin: Secondary | ICD-10-CM | POA: Diagnosis not present

## 2020-07-12 DIAGNOSIS — G4730 Sleep apnea, unspecified: Secondary | ICD-10-CM | POA: Diagnosis not present

## 2020-07-12 DIAGNOSIS — M79605 Pain in left leg: Secondary | ICD-10-CM | POA: Diagnosis not present

## 2020-07-14 DIAGNOSIS — M79604 Pain in right leg: Secondary | ICD-10-CM | POA: Diagnosis not present

## 2020-07-14 DIAGNOSIS — R202 Paresthesia of skin: Secondary | ICD-10-CM | POA: Diagnosis not present

## 2020-07-14 DIAGNOSIS — R200 Anesthesia of skin: Secondary | ICD-10-CM | POA: Diagnosis not present

## 2020-07-14 DIAGNOSIS — G4730 Sleep apnea, unspecified: Secondary | ICD-10-CM | POA: Diagnosis not present

## 2020-07-14 DIAGNOSIS — M545 Low back pain, unspecified: Secondary | ICD-10-CM | POA: Diagnosis not present

## 2020-07-14 DIAGNOSIS — R278 Other lack of coordination: Secondary | ICD-10-CM | POA: Diagnosis not present

## 2020-07-14 DIAGNOSIS — I10 Essential (primary) hypertension: Secondary | ICD-10-CM | POA: Diagnosis not present

## 2020-07-14 DIAGNOSIS — M79605 Pain in left leg: Secondary | ICD-10-CM | POA: Diagnosis not present

## 2020-07-14 DIAGNOSIS — G609 Hereditary and idiopathic neuropathy, unspecified: Secondary | ICD-10-CM | POA: Diagnosis not present

## 2020-07-15 DIAGNOSIS — M79605 Pain in left leg: Secondary | ICD-10-CM | POA: Diagnosis not present

## 2020-07-15 DIAGNOSIS — M545 Low back pain, unspecified: Secondary | ICD-10-CM | POA: Diagnosis not present

## 2020-07-15 DIAGNOSIS — R200 Anesthesia of skin: Secondary | ICD-10-CM | POA: Diagnosis not present

## 2020-07-15 DIAGNOSIS — R278 Other lack of coordination: Secondary | ICD-10-CM | POA: Diagnosis not present

## 2020-07-15 DIAGNOSIS — I10 Essential (primary) hypertension: Secondary | ICD-10-CM | POA: Diagnosis not present

## 2020-07-15 DIAGNOSIS — R202 Paresthesia of skin: Secondary | ICD-10-CM | POA: Diagnosis not present

## 2020-07-15 DIAGNOSIS — G609 Hereditary and idiopathic neuropathy, unspecified: Secondary | ICD-10-CM | POA: Diagnosis not present

## 2020-07-15 DIAGNOSIS — M79604 Pain in right leg: Secondary | ICD-10-CM | POA: Diagnosis not present

## 2020-07-15 DIAGNOSIS — G4730 Sleep apnea, unspecified: Secondary | ICD-10-CM | POA: Diagnosis not present

## 2020-07-16 ENCOUNTER — Telehealth: Payer: Self-pay | Admitting: Internal Medicine

## 2020-07-16 DIAGNOSIS — J449 Chronic obstructive pulmonary disease, unspecified: Secondary | ICD-10-CM

## 2020-07-16 NOTE — Telephone Encounter (Signed)
Noah Huffman  Overnight oxygen study done on 07/07/2020 shows pulse ox less than equal to 88% for 2 hours and 37 minutes which is 51% of sleeping time.  His low pulse was only 62/min  Plan - Start 2 L of nasal cannula - Has he been evaluated for sleep apnea? -If not I can discuss this with him at next visit or he can discuss with primary care physician  Please pick of the sign papers from the side of my desk

## 2020-07-17 ENCOUNTER — Other Ambulatory Visit: Payer: Self-pay | Admitting: Internal Medicine

## 2020-07-17 ENCOUNTER — Other Ambulatory Visit (HOSPITAL_COMMUNITY): Payer: Self-pay | Admitting: Physician Assistant

## 2020-07-19 DIAGNOSIS — M79604 Pain in right leg: Secondary | ICD-10-CM | POA: Diagnosis not present

## 2020-07-19 DIAGNOSIS — G609 Hereditary and idiopathic neuropathy, unspecified: Secondary | ICD-10-CM | POA: Diagnosis not present

## 2020-07-19 DIAGNOSIS — M545 Low back pain, unspecified: Secondary | ICD-10-CM | POA: Diagnosis not present

## 2020-07-19 DIAGNOSIS — R202 Paresthesia of skin: Secondary | ICD-10-CM | POA: Diagnosis not present

## 2020-07-19 DIAGNOSIS — I10 Essential (primary) hypertension: Secondary | ICD-10-CM | POA: Diagnosis not present

## 2020-07-19 DIAGNOSIS — M79605 Pain in left leg: Secondary | ICD-10-CM | POA: Diagnosis not present

## 2020-07-19 DIAGNOSIS — R278 Other lack of coordination: Secondary | ICD-10-CM | POA: Diagnosis not present

## 2020-07-19 DIAGNOSIS — R200 Anesthesia of skin: Secondary | ICD-10-CM | POA: Diagnosis not present

## 2020-07-19 DIAGNOSIS — G4730 Sleep apnea, unspecified: Secondary | ICD-10-CM | POA: Diagnosis not present

## 2020-07-19 NOTE — Telephone Encounter (Signed)
Spoke to patient and relayed below results.  Patient voiced his understanding.  Patient has had sleep study in the past, which was borderline for OSA. He will discuss this further at his next OV.  2L QHS has been ordered. Nothing further needed at this time.

## 2020-07-20 ENCOUNTER — Other Ambulatory Visit: Payer: Self-pay | Admitting: *Deleted

## 2020-07-20 DIAGNOSIS — J449 Chronic obstructive pulmonary disease, unspecified: Secondary | ICD-10-CM

## 2020-07-20 DIAGNOSIS — J9611 Chronic respiratory failure with hypoxia: Secondary | ICD-10-CM

## 2020-07-21 ENCOUNTER — Telehealth: Payer: Self-pay | Admitting: Internal Medicine

## 2020-07-21 DIAGNOSIS — G609 Hereditary and idiopathic neuropathy, unspecified: Secondary | ICD-10-CM | POA: Diagnosis not present

## 2020-07-21 DIAGNOSIS — J9611 Chronic respiratory failure with hypoxia: Secondary | ICD-10-CM

## 2020-07-21 DIAGNOSIS — G4730 Sleep apnea, unspecified: Secondary | ICD-10-CM | POA: Diagnosis not present

## 2020-07-21 DIAGNOSIS — M545 Low back pain, unspecified: Secondary | ICD-10-CM | POA: Diagnosis not present

## 2020-07-21 DIAGNOSIS — R200 Anesthesia of skin: Secondary | ICD-10-CM | POA: Diagnosis not present

## 2020-07-21 DIAGNOSIS — J449 Chronic obstructive pulmonary disease, unspecified: Secondary | ICD-10-CM

## 2020-07-21 DIAGNOSIS — R202 Paresthesia of skin: Secondary | ICD-10-CM | POA: Diagnosis not present

## 2020-07-21 DIAGNOSIS — I10 Essential (primary) hypertension: Secondary | ICD-10-CM | POA: Diagnosis not present

## 2020-07-21 DIAGNOSIS — R278 Other lack of coordination: Secondary | ICD-10-CM | POA: Diagnosis not present

## 2020-07-21 DIAGNOSIS — M79605 Pain in left leg: Secondary | ICD-10-CM | POA: Diagnosis not present

## 2020-07-21 DIAGNOSIS — M79604 Pain in right leg: Secondary | ICD-10-CM | POA: Diagnosis not present

## 2020-07-21 NOTE — Telephone Encounter (Signed)
Called and spoke with patient who states that he is calling in regards to his oxygen mask order that was placed. Patient states that he is a mouth breather at night and wants to get Dr. Orvil Feil opinion if he should do a nasal cannula or a mask. States that it was mentions to him by DME of the potential of carbon monoxide poisoning with the mask and wants to know what Dr. [name] thinks.  Dr. [name] please advise

## 2020-07-22 DIAGNOSIS — I10 Essential (primary) hypertension: Secondary | ICD-10-CM | POA: Diagnosis not present

## 2020-07-22 DIAGNOSIS — G609 Hereditary and idiopathic neuropathy, unspecified: Secondary | ICD-10-CM | POA: Diagnosis not present

## 2020-07-22 DIAGNOSIS — R200 Anesthesia of skin: Secondary | ICD-10-CM | POA: Diagnosis not present

## 2020-07-22 DIAGNOSIS — M79605 Pain in left leg: Secondary | ICD-10-CM | POA: Diagnosis not present

## 2020-07-22 DIAGNOSIS — M79604 Pain in right leg: Secondary | ICD-10-CM | POA: Diagnosis not present

## 2020-07-22 DIAGNOSIS — R278 Other lack of coordination: Secondary | ICD-10-CM | POA: Diagnosis not present

## 2020-07-22 DIAGNOSIS — G4730 Sleep apnea, unspecified: Secondary | ICD-10-CM | POA: Diagnosis not present

## 2020-07-22 DIAGNOSIS — R202 Paresthesia of skin: Secondary | ICD-10-CM | POA: Diagnosis not present

## 2020-07-22 DIAGNOSIS — M545 Low back pain, unspecified: Secondary | ICD-10-CM | POA: Diagnosis not present

## 2020-07-22 NOTE — Telephone Encounter (Signed)
Called and spoke with patient. He verbalized understanding about the carbon monoxide/dioxide. I attempted to get a name and number of the person he has been speaking with but he did not have it. I asked him to see if he can get this information. Will await his call back.

## 2020-07-22 NOTE — Telephone Encounter (Signed)
Never heard of carbon monoxide poisoning. MAybe they talked about carbon dioxide retention. His blood chemistries do not indicate is a potential problem for him. Regardless, his DME company agent should call our office and give his/her cell phone number so I can call and clarify

## 2020-07-26 ENCOUNTER — Ambulatory Visit (INDEPENDENT_AMBULATORY_CARE_PROVIDER_SITE_OTHER): Payer: PPO

## 2020-07-26 DIAGNOSIS — R200 Anesthesia of skin: Secondary | ICD-10-CM | POA: Diagnosis not present

## 2020-07-26 DIAGNOSIS — M545 Low back pain, unspecified: Secondary | ICD-10-CM | POA: Diagnosis not present

## 2020-07-26 DIAGNOSIS — R202 Paresthesia of skin: Secondary | ICD-10-CM | POA: Diagnosis not present

## 2020-07-26 DIAGNOSIS — G609 Hereditary and idiopathic neuropathy, unspecified: Secondary | ICD-10-CM | POA: Diagnosis not present

## 2020-07-26 DIAGNOSIS — M79605 Pain in left leg: Secondary | ICD-10-CM | POA: Diagnosis not present

## 2020-07-26 DIAGNOSIS — R278 Other lack of coordination: Secondary | ICD-10-CM | POA: Diagnosis not present

## 2020-07-26 DIAGNOSIS — I10 Essential (primary) hypertension: Secondary | ICD-10-CM | POA: Diagnosis not present

## 2020-07-26 DIAGNOSIS — M79604 Pain in right leg: Secondary | ICD-10-CM | POA: Diagnosis not present

## 2020-07-26 DIAGNOSIS — I483 Typical atrial flutter: Secondary | ICD-10-CM | POA: Diagnosis not present

## 2020-07-26 DIAGNOSIS — G4730 Sleep apnea, unspecified: Secondary | ICD-10-CM | POA: Diagnosis not present

## 2020-07-26 LAB — CUP PACEART REMOTE DEVICE CHECK
Date Time Interrogation Session: 20220507212149
Implantable Pulse Generator Implant Date: 20211122

## 2020-07-26 NOTE — Telephone Encounter (Signed)
Called and spoke to pt. Pt states he doesn't have a direct contact to whom he spoke with, pt doesn't remember a name.   Pt did say he is a mouth breather at night and doesn't feel he will get the O2 he needs with a nasal cannula.   MR, please advise if ok to order O2 face mask for his nocturnal O2. Thanks.

## 2020-07-28 DIAGNOSIS — R200 Anesthesia of skin: Secondary | ICD-10-CM | POA: Diagnosis not present

## 2020-07-28 DIAGNOSIS — G4730 Sleep apnea, unspecified: Secondary | ICD-10-CM | POA: Diagnosis not present

## 2020-07-28 DIAGNOSIS — G609 Hereditary and idiopathic neuropathy, unspecified: Secondary | ICD-10-CM | POA: Diagnosis not present

## 2020-07-28 DIAGNOSIS — R278 Other lack of coordination: Secondary | ICD-10-CM | POA: Diagnosis not present

## 2020-07-28 DIAGNOSIS — M545 Low back pain, unspecified: Secondary | ICD-10-CM | POA: Diagnosis not present

## 2020-07-28 DIAGNOSIS — M79604 Pain in right leg: Secondary | ICD-10-CM | POA: Diagnosis not present

## 2020-07-28 DIAGNOSIS — M79605 Pain in left leg: Secondary | ICD-10-CM | POA: Diagnosis not present

## 2020-07-28 DIAGNOSIS — I10 Essential (primary) hypertension: Secondary | ICD-10-CM | POA: Diagnosis not present

## 2020-07-28 DIAGNOSIS — R202 Paresthesia of skin: Secondary | ICD-10-CM | POA: Diagnosis not present

## 2020-07-29 DIAGNOSIS — M79605 Pain in left leg: Secondary | ICD-10-CM | POA: Diagnosis not present

## 2020-07-29 DIAGNOSIS — I10 Essential (primary) hypertension: Secondary | ICD-10-CM | POA: Diagnosis not present

## 2020-07-29 DIAGNOSIS — R200 Anesthesia of skin: Secondary | ICD-10-CM | POA: Diagnosis not present

## 2020-07-29 DIAGNOSIS — M545 Low back pain, unspecified: Secondary | ICD-10-CM | POA: Diagnosis not present

## 2020-07-29 DIAGNOSIS — J9611 Chronic respiratory failure with hypoxia: Secondary | ICD-10-CM | POA: Diagnosis not present

## 2020-07-29 DIAGNOSIS — G609 Hereditary and idiopathic neuropathy, unspecified: Secondary | ICD-10-CM | POA: Diagnosis not present

## 2020-07-29 DIAGNOSIS — J418 Mixed simple and mucopurulent chronic bronchitis: Secondary | ICD-10-CM | POA: Diagnosis not present

## 2020-07-29 DIAGNOSIS — M79604 Pain in right leg: Secondary | ICD-10-CM | POA: Diagnosis not present

## 2020-07-29 DIAGNOSIS — G4730 Sleep apnea, unspecified: Secondary | ICD-10-CM | POA: Diagnosis not present

## 2020-07-29 DIAGNOSIS — R202 Paresthesia of skin: Secondary | ICD-10-CM | POA: Diagnosis not present

## 2020-07-29 DIAGNOSIS — R278 Other lack of coordination: Secondary | ICD-10-CM | POA: Diagnosis not present

## 2020-07-30 ENCOUNTER — Telehealth: Payer: Self-pay | Admitting: Internal Medicine

## 2020-07-30 NOTE — Telephone Encounter (Signed)
Call returned to patient, confirmed DOB. Patient wanted to know why oxygen was delivered to his home. I made him aware he was given a over night oxygen test and it determined he needed oxygen at night. He inquired as to why extra tanks were and equipment was delivered. I made him aware the DME company will leave extra tanks sometimes as a precaution in case of an emergency. He states he is paying for all this and he does not need any extra equipment. I made him aware I would send a message to [employer] to see what we can do.   Message to [employer]:

## 2020-08-03 NOTE — Telephone Encounter (Signed)
Melissa with [employer] doesn't access Epic as often as she use to. New message sent to [facility] with [employer].

## 2020-08-05 NOTE — Telephone Encounter (Signed)
Called and left message for pt that [employer] will call him to re-educate him about the O2 equipment that was delivered to pt. Advised pt to call back if he still has questions.   New, Annye Asa, Zettie Pho, RN; New, Lynford Citizen,   I will have someone from our team reach out to him to explain.   Thank you,   Demetrius Charity

## 2020-08-06 NOTE — Telephone Encounter (Signed)
I called and spoke with the pt and notified him that [employer] will be contacting him to re-educate him on o2 equipment. He verbalized understanding and nothing further needed.

## 2020-08-11 DIAGNOSIS — Z4789 Encounter for other orthopedic aftercare: Secondary | ICD-10-CM | POA: Diagnosis not present

## 2020-08-11 NOTE — Telephone Encounter (Signed)
Called and spoke to pt. Pt states he no longer wants a face mask and is doing fine as is. However, pt was suppose to hear from [employer] regarding all his O2 equipment and education on what it is for. Pt states he hasnt heard anything (see phone note from 07/30/20). Will message Brad with [employer] regarding this.

## 2020-08-13 NOTE — Progress Notes (Signed)
[facility] Summary Report / Loop Recorder 

## 2020-08-26 NOTE — Telephone Encounter (Signed)
Called and spoke to pt. Pt states he still has yet to hear from anyone at [employer] to help him understand his O2 system better. Pt also is questioning again if he is getting enough O2 at night since he is a mouth breather and thinks the nasal cannula isnt giving him what he needs.  A new message was sent back to [facility] about having one person reach out to pt as he has been waiting a while. Instead of messaging a team.   MR, please advise if you want to do an ONO on O2 as pt doesn't think he is getting what he needs with the nasal cannula since he is a mouth breather.   ----------------------------------------------------      Glynis Smiles, RN; Miquel Dunn; Gevena Cotton,   I have messaged the o2 team again asking for someone to reach out to the client again regarding his o2 equipment.   I know they were attempting to reach him when the initial email was sent but I have no notes on what took place on that attempt.   Thanks,   [employer]

## 2020-08-27 LAB — CUP PACEART REMOTE DEVICE CHECK
Date Time Interrogation Session: 20220609211751
Implantable Pulse Generator Implant Date: 20211122

## 2020-08-27 NOTE — Telephone Encounter (Signed)
Dennard Nip idea to get ONO on the O2  Thanks  MR

## 2020-08-29 DIAGNOSIS — J9611 Chronic respiratory failure with hypoxia: Secondary | ICD-10-CM | POA: Diagnosis not present

## 2020-08-29 DIAGNOSIS — J418 Mixed simple and mucopurulent chronic bronchitis: Secondary | ICD-10-CM | POA: Diagnosis not present

## 2020-08-30 ENCOUNTER — Ambulatory Visit (INDEPENDENT_AMBULATORY_CARE_PROVIDER_SITE_OTHER): Payer: PPO

## 2020-08-30 DIAGNOSIS — I483 Typical atrial flutter: Secondary | ICD-10-CM | POA: Diagnosis not present

## 2020-08-31 NOTE — Telephone Encounter (Signed)
Spoke with the pt and notified of response per MR  ONO ordered on 2lpm  He states [employer] called about helping him better understand his equipment but he was out of town  He is going to call them back this wk  Nothing further needed

## 2020-09-02 ENCOUNTER — Telehealth: Payer: Self-pay | Admitting: Internal Medicine

## 2020-09-02 DIAGNOSIS — G4730 Sleep apnea, unspecified: Secondary | ICD-10-CM | POA: Diagnosis not present

## 2020-09-02 DIAGNOSIS — R0683 Snoring: Secondary | ICD-10-CM | POA: Diagnosis not present

## 2020-09-02 NOTE — Telephone Encounter (Signed)
Called and spoke with patient. He stated that he is only using his O2 at night and never during the day. He stated that [employer] has sent out 2 O2 tanks with wheels, a home fill concentrator and an emergency o2 tank. He is concerned that his insurance is getting charged for too much stuff. He called [employer] this morning and the advised him that they could not come and pick up any of the extra equipment until they receive an order that states he is only on O2 at night.   I advised him that the order that was placed back in May 2022 stated for him to be on O2 only at night, not during the day as well.   I advised him that I would send a community message over to [employer] to follow up on this. Will update once I get a reply.

## 2020-09-03 NOTE — Telephone Encounter (Signed)
I called [employer] & spoke to [facility].  She is going to check pt's account & call me back.

## 2020-09-03 NOTE — Telephone Encounter (Signed)
It looks like the order was sent to [employer] on 07/20/20 that he only wears oxygen at night.  PCCs:  Can you please follow up on this for the patient please?  Thank you.

## 2020-09-03 NOTE — Telephone Encounter (Signed)
Danielle called me back.  She can see where pt should only be on night time O2.  She is going to call pt & have any equipment he is not using picked up.  She will let us know if anything else is needed.

## 2020-09-21 NOTE — Progress Notes (Signed)
[facility] Summary Report / Loop Recorder 

## 2020-09-23 ENCOUNTER — Telehealth: Payer: Self-pay | Admitting: Internal Medicine

## 2020-09-23 ENCOUNTER — Encounter: Payer: Self-pay | Admitting: Internal Medicine

## 2020-09-23 NOTE — Telephone Encounter (Signed)
Overnight oxygen test done on 09/02/2020 on 2 L nasal cannula shows pulse ox less than 88% for 2 hours and for 24 minutes his lowest pulse ox was 78%.  And for close to 39 and half minutes his heart rate was less than 60 bpm.  Plan - He definitely needs oxygen at night.-He should continue his 2 L nasal cannula - Has never been evaluated for sleep apnea [chart says 2015] is using CPAP?  If he is not on CPAP or if its been a while since he got evaluated he should see a sleep doctor particularly with his atrial flutter diagnosing sleep apnea and getting treated for that can can help immensely

## 2020-09-24 NOTE — Telephone Encounter (Signed)
Called and spoke with pt letting him know the results of the ONO and stated to him to make sure he continues wearing the O2 at night and he verbalized understanding.  Asked pt if he had cpap and he said that he does not. Pt has had two sleep studies in the past which showed that he was borderline for sleep apnea. Stated that he has claustrophobia and will need to have a mask that is not a full face mask if he does turn out having sleep apnea.  Pt has been scheduled for a sleep consult with Dr. Jerrol Banana 8/4. Nothing further needed.

## 2020-09-29 LAB — CUP PACEART REMOTE DEVICE CHECK
Date Time Interrogation Session: 20220712211906
Implantable Pulse Generator Implant Date: 20211122

## 2020-10-04 ENCOUNTER — Ambulatory Visit (INDEPENDENT_AMBULATORY_CARE_PROVIDER_SITE_OTHER): Payer: PPO

## 2020-10-04 DIAGNOSIS — I483 Typical atrial flutter: Secondary | ICD-10-CM | POA: Diagnosis not present

## 2020-10-12 ENCOUNTER — Encounter: Payer: Self-pay | Admitting: Internal Medicine

## 2020-10-15 ENCOUNTER — Other Ambulatory Visit: Payer: Self-pay | Admitting: Internal Medicine

## 2020-10-20 NOTE — Progress Notes (Signed)
10/21/20- 80 yoM former smoker (62.5 pk yrs) for sleep evaluation courtesy of Dr [name] Medical problem list includes A Fib, HTN, OSA, COPD Gold II/ Bronchiectasis, Asthma, GERD, BPH, Obesity,  NPSG 04/27/13- AHI/ 16.5/ hr, desaturation to 87%, body weight 290 lbs, incomplete CPAP titration in available time ONOX 09/02/20- on 2l showed sustained hypoxemia and bradycardia on O2. O2 2L Sleep-/ [employer] Epworth score-6 Body weight today-257 lbs Has lost 40 lbs Covid vax- 2 Phizer He says he was told in past he didn't need CPAP. He reports losing 40 lbs in last 2 years. Has home O2 now- ok sleeping with it.  Hx nasal fracture. ENT recommended repair- never done.  Prior to Admission medications   Medication Sig Start Date End Date Taking? Authorizing Provider  albuterol (VENTOLIN HFA) 108 (90 Base) MCG/ACT inhaler Inhale 2 puffs into the lungs every 4 (four) hours as needed for wheezing or shortness of breath.  12/16/18  Yes [provider]  Budeson-Glycopyrrol-Formoterol (BREZTRI AEROSPHERE) 160-9-4.8 MCG/ACT AERO Inhale 2 puffs into the lungs in the morning and at bedtime. 07/01/20  Yes Brand Males, MD  budesonide-formoterol (SYMBICORT) 160-4.5 MCG/ACT inhaler INHALE 2 PUFFS BY MOUTH TWICE DAILY AS NEEDED 10/15/20  Yes Brand Males, MD  fluticasone (FLONASE) 50 MCG/ACT nasal spray Place 1 spray into both nostrils daily.   Yes [provider]  gabapentin (NEURONTIN) 600 MG tablet Take 1,800 mg by mouth 3 (three) times daily.   Yes [provider]  losartan-hydrochlorothiazide (HYZAAR) 100-25 MG tablet Take 1 tablet by mouth daily. 04/12/15  Yes [provider]  Multiple Vitamins-Minerals (PRESERVISION AREDS 2) CAPS Take 1 capsule by mouth daily.    Yes [provider]  polyvinyl alcohol (LIQUIFILM TEARS) 1.4 % ophthalmic solution Place 1 drop into both eyes 2 (two) times daily.   Yes [provider]  rosuvastatin (CRESTOR) 10 MG tablet Take 10  mg by mouth daily.  08/24/17  Yes [provider]  sodium chloride (OCEAN) 0.65 % SOLN nasal spray Place 1 spray into both nostrils daily.   Yes [provider]  tamsulosin (FLOMAX) 0.4 MG CAPS capsule Take 0.4 mg by mouth daily. 02/03/20  Yes [provider]  Tiotropium Bromide Monohydrate (SPIRIVA RESPIMAT) 2.5 MCG/ACT AERS Inhale 2 puffs into the lungs daily. 01/14/20  Yes Brand Males, MD   Past Medical History:  Diagnosis Date   Allergy    seasonal   Asthma    Atrial fibrillation ([place])    BPH (benign prostatic hypertrophy) with urinary obstruction    Cataract    removed both eyes   COPD (chronic obstructive pulmonary disease) ([place])    Diverticulosis    Dysrhythmia    GERD (gastroesophageal reflux disease)    Hyperglycemia    Hyperlipidemia    Hypertension    Inguinal hernia    Neuromuscular disorder ([place])    neuropathy in feet    Obesity    OSA (obstructive sleep apnea) 06/06/2013   Osteoarthritis    Sleep apnea    no cpap   Past Surgical History:  Procedure Laterality Date   A-FLUTTER ABLATION N/A 01/02/2020   Procedure: A-FLUTTER ABLATION;  Surgeon: Vickie Epley, MD;  Location: [place] [facility];  Service: Cardiovascular;  Laterality: N/A;   APPENDECTOMY     arthroscopic knee surgery     CATARACT EXTRACTION  2012   bilateral   COLONOSCOPY     HAMMER TOE SURGERY     INGUINAL HERNIA REPAIR  right   POLYPECTOMY     REVERSE SHOULDER ARTHROPLASTY Right 03/26/2020   Procedure: REVERSE SHOULDER ARTHROPLASTY;  Surgeon: Netta Cedars, MD;  Location: [facility];  Service: Orthopedics;  Laterality: Right;  interscalane block   TONSILLECTOMY AND ADENOIDECTOMY     TOTAL HIP ARTHROPLASTY  2007   right   Family History  Problem Relation Age of Onset   Leukemia Father    Transient ischemic attack Father    Pneumonia Father    Colon cancer Mother 30   Brain cancer Brother    Pulmonary fibrosis Brother    Colon polyps Neg Hx     Esophageal cancer Neg Hx    Rectal cancer Neg Hx    Stomach cancer Neg Hx    Social History   Socioeconomic History   Marital status: Married    Spouse name: Not on file   Number of children: 2   Years of education: Not on file   Highest education level: Not on file  Occupational History   Occupation: retired  Tobacco Use   Smoking status: Former    Packs/day: 2.50    Years: 25.00    Pack years: 62.50    Types: Cigarettes    Quit date: 03/20/1988    Years since quitting: 32.6   Smokeless tobacco: Never  Vaping Use   Vaping Use: Never used  Substance and Sexual Activity   Alcohol use: Yes    Alcohol/week: 1.0 standard drink    Types: 1 Shots of liquor per week    Comment: 1-2 drinks per day or less    Drug use: No   Sexual activity: Not on file  Other Topics Concern   Not on file  Social History Narrative   Not on file   Social Determinants of Health   Financial Resource Strain: Not on file  Food Insecurity: Not on file  Transportation Needs: Not on file  Physical Activity: Not on file  Stress: Not on file  Social Connections: Not on file  Intimate Partner Violence: Not on file   ROS-see HPI   + = positive Constitutional:    +weight loss, night sweats, fevers, chills, fatigue, lassitude. HEENT:    headaches, difficulty swallowing, tooth/dental problems, sore throat,       sneezing, itching, ear ache, +nasal congestion, post nasal drip, snoring CV:    +chest pain, orthopnea, PND, swelling in lower extremities, anasarca,                                  dizziness, palpitations Resp:   +shortness of breath with exertion or at rest.                +productive cough,   non-productive cough, coughing up of blood.              change in color of mucus.  wheezing.   Skin:    rash or lesions. GI:  No-   heartburn, indigestion, abdominal pain, nausea, vomiting, diarrhea,                 change in bowel habits, loss of appetite GU: dysuria, change in color of urine, no  urgency or frequency.   flank pain. MS:   joint pain, +stiffness, decreased range of motion, back pain. Neuro-     nothing unusual Psych:  change in mood or affect.  depression or anxiety.   memory loss. OBJ- Physical  Exam General- Alert, Oriented, Affect-appropriate, Distress- none acute Skin- rash-none, lesions- none, excoriation- none Lymphadenopathy- none Head- atraumatic            Eyes- Gross vision intact, PERRLA, conjunctivae and secretions clear            Ears- Hearing, canals-normal            Nose- Clear, no-Septal dev, mucus, polyps, erosion, perforation             Throat- Mallampati II , mucosa clear , drainage- none, tonsils- atrophic Neck- flexible , trachea midline, no stridor , thyroid nl, carotid no bruit Chest - symmetrical excursion , unlabored           Heart/CV- RRR , no murmur , no gallop  , no rub, nl s1 s2                           - JVD- none , edema- none, stasis changes- none, varices- none           Lung- clear to P&A, wheeze- none, cough- none , dullness-none, rub- none           Chest wall-  Abd-  Br/ Gen/ Rectal- Not done, not indicated Extrem- cyanosis- none, clubbing, none, atrophy- none, strength- nl Neuro- grossly intact to observation

## 2020-10-21 ENCOUNTER — Ambulatory Visit (INDEPENDENT_AMBULATORY_CARE_PROVIDER_SITE_OTHER): Payer: PPO | Admitting: Internal Medicine

## 2020-10-21 ENCOUNTER — Other Ambulatory Visit: Payer: Self-pay

## 2020-10-21 VITALS — BP 114/56 | HR 73 | Temp 97.9°F | Ht 75.0 in | Wt 257.8 lb

## 2020-10-21 DIAGNOSIS — J9611 Chronic respiratory failure with hypoxia: Secondary | ICD-10-CM | POA: Diagnosis not present

## 2020-10-21 DIAGNOSIS — G4733 Obstructive sleep apnea (adult) (pediatric): Secondary | ICD-10-CM

## 2020-10-21 DIAGNOSIS — J479 Bronchiectasis, uncomplicated: Secondary | ICD-10-CM | POA: Diagnosis not present

## 2020-10-21 NOTE — Patient Instructions (Addendum)
Order- split night sleep study with O2 qualification  Order- DME [employer]- please add humidifier to O2 concentrator  Ok to continue using your nasal spray  You can try using Breath Right nasal strips  You can try nasal saline gel  Please call for results of your sleep study

## 2020-10-24 ENCOUNTER — Encounter: Payer: Self-pay | Admitting: Internal Medicine

## 2020-10-24 NOTE — Assessment & Plan Note (Signed)
He has lost weight since 2015. Because he is on supplemental O2, we will need to do a Split Night sleep study at sleep center to determine current status. He agrees.

## 2020-10-24 NOTE — Assessment & Plan Note (Signed)
He describes chronic productive cough without exacerbation. Follows with Dr [name] for general Pulmonary issues.

## 2020-10-25 ENCOUNTER — Telehealth: Payer: Self-pay | Admitting: *Deleted

## 2020-10-25 NOTE — Telephone Encounter (Signed)
Called patient and gave him Dr.Perry's recommendations. Made OV with the patient for 9/21. Colon and PV cancelled-pt is aware.   Patient denies blood thinners

## 2020-10-25 NOTE — Telephone Encounter (Signed)
Dr.Perry,  This 80 year old patient is for a recall colonoscopy. Per his last pulmonary OV he is using oxygen at home for COPD. Ok for direct hospital colon or OV? Please advise. Thank you, Moishe Schellenberg pv

## 2020-10-25 NOTE — Telephone Encounter (Signed)
Routine office visit would be best.  Thanks

## 2020-10-27 NOTE — Progress Notes (Signed)
[facility] Summary Report / Loop Recorder 

## 2020-11-04 LAB — CUP PACEART REMOTE DEVICE CHECK
Date Time Interrogation Session: 20220814211848
Implantable Pulse Generator Implant Date: 20211122

## 2020-11-08 ENCOUNTER — Ambulatory Visit (INDEPENDENT_AMBULATORY_CARE_PROVIDER_SITE_OTHER): Payer: PPO

## 2020-11-08 DIAGNOSIS — I483 Typical atrial flutter: Secondary | ICD-10-CM | POA: Diagnosis not present

## 2020-11-16 ENCOUNTER — Telehealth: Payer: Self-pay | Admitting: Internal Medicine

## 2020-11-16 ENCOUNTER — Telehealth: Payer: Self-pay | Admitting: Cardiology

## 2020-11-16 MED ORDER — BREZTRI AEROSPHERE 160-9-4.8 MCG/ACT IN AERO
2.0000 | INHALATION_SPRAY | Freq: Two times a day (BID) | RESPIRATORY_TRACT | 5 refills | Status: DC
Start: 2020-11-16 — End: 2021-08-05

## 2020-11-16 NOTE — Telephone Encounter (Signed)
Call returned to patient, confirmed DOB. He states MR wanted him to try [facility] and if it worked to call to have it sent to his pharmacy. Confirmed pharmacy. Medication sent.   Nothing further needed at this time.

## 2020-11-16 NOTE — Telephone Encounter (Signed)
This morning Noah Huffman came in after an appt he had down the road today. Patient wanted to let Dr Quentin Ore know that his device had lit up red this morning and he wanted to make sure this was fine. He said the device/monitor had not had a red light on it so this was weird. Please call patient to discuss this with him and  advise on what to do.

## 2020-11-16 NOTE — Telephone Encounter (Signed)
I let the patient know that we did get a transmission. I asked him is the monitor red now? He states no. I told him if it turns red again to feel free to call us. He also express concerns because he was not with the monitor for 3 weeks and we did not call him. I let him know we have no way of knowing when he is with or not with the monitor. The monitor does not give [place] a location on where the patient is. I told him if he had called the office we would have told him if he was going to be gone for more than 7 days to take the monitor with him. The patient verbalized understanding.

## 2020-11-23 DIAGNOSIS — I700 Atherosclerosis of aorta: Secondary | ICD-10-CM | POA: Diagnosis not present

## 2020-11-23 DIAGNOSIS — M159 Polyosteoarthritis, unspecified: Secondary | ICD-10-CM | POA: Diagnosis not present

## 2020-11-23 DIAGNOSIS — I712 Thoracic aortic aneurysm, without rupture: Secondary | ICD-10-CM | POA: Diagnosis not present

## 2020-11-23 DIAGNOSIS — G4733 Obstructive sleep apnea (adult) (pediatric): Secondary | ICD-10-CM | POA: Diagnosis not present

## 2020-11-23 DIAGNOSIS — E785 Hyperlipidemia, unspecified: Secondary | ICD-10-CM | POA: Diagnosis not present

## 2020-11-23 DIAGNOSIS — G609 Hereditary and idiopathic neuropathy, unspecified: Secondary | ICD-10-CM | POA: Diagnosis not present

## 2020-11-23 DIAGNOSIS — D692 Other nonthrombocytopenic purpura: Secondary | ICD-10-CM | POA: Diagnosis not present

## 2020-11-23 DIAGNOSIS — N401 Enlarged prostate with lower urinary tract symptoms: Secondary | ICD-10-CM | POA: Diagnosis not present

## 2020-11-23 DIAGNOSIS — I10 Essential (primary) hypertension: Secondary | ICD-10-CM | POA: Diagnosis not present

## 2020-11-23 DIAGNOSIS — I2721 Secondary pulmonary arterial hypertension: Secondary | ICD-10-CM | POA: Diagnosis not present

## 2020-11-23 DIAGNOSIS — J449 Chronic obstructive pulmonary disease, unspecified: Secondary | ICD-10-CM | POA: Diagnosis not present

## 2020-11-24 NOTE — Progress Notes (Signed)
[facility] Summary Report / Loop Recorder 

## 2020-11-25 DIAGNOSIS — H6123 Impacted cerumen, bilateral: Secondary | ICD-10-CM | POA: Diagnosis not present

## 2020-12-01 ENCOUNTER — Encounter: Payer: PPO | Admitting: Internal Medicine

## 2020-12-02 DIAGNOSIS — H353131 Nonexudative age-related macular degeneration, bilateral, early dry stage: Secondary | ICD-10-CM | POA: Diagnosis not present

## 2020-12-02 DIAGNOSIS — Z961 Presence of intraocular lens: Secondary | ICD-10-CM | POA: Diagnosis not present

## 2020-12-08 ENCOUNTER — Ambulatory Visit: Payer: PPO | Admitting: Internal Medicine

## 2020-12-08 ENCOUNTER — Encounter: Payer: Self-pay | Admitting: Internal Medicine

## 2020-12-08 VITALS — BP 120/56 | HR 75 | Ht 75.0 in | Wt 257.1 lb

## 2020-12-08 DIAGNOSIS — Z86010 Personal history of colonic polyps: Secondary | ICD-10-CM | POA: Diagnosis not present

## 2020-12-08 LAB — CUP PACEART REMOTE DEVICE CHECK
Date Time Interrogation Session: 20220916212125
Implantable Pulse Generator Implant Date: 20211122

## 2020-12-08 NOTE — Patient Instructions (Signed)
If you are age 79 or older, your body mass index should be between 23-30. Your Body mass index is 32.14 kg/m. If this is out of the aforementioned range listed, please consider follow up with your Primary Care Provider.  If you are age 55 or younger, your body mass index should be between 19-25. Your Body mass index is 32.14 kg/m. If this is out of the aformentioned range listed, please consider follow up with your Primary Care Provider.   __________________________________________________________  The [place] [facility] providers would like to encourage you to use [facility] to communicate with providers for non-urgent requests or questions.  Due to long hold times on the telephone, sending your provider a message by [facility] may be a faster and more efficient way to get a response.  Please allow 48 business hours for a response.  Please remember that this is for non-urgent requests.   Please follow up as needed

## 2020-12-08 NOTE — Progress Notes (Signed)
HISTORY OF PRESENT ILLNESS:  Noah Huffman is a 80 y.o. male, retired Scientist, clinical (histocompatibility and immunogenetics), with multiple medical problems including COPD, now on home oxygen, obesity, hypertension, and hyperlipidemia.  He has been seen in this office for GERD (normal EGD April 2014) and adenomatous colon polyps.  He presents today regarding surveillance colonoscopy.  Patient has a history of nonadvanced adenomas.  Previous colonoscopic examinations 2014 and most recently July 2019.  On his last examination he was found to have 4 diminutive adenomas and internal hemorrhoids.  The examination was otherwise normal.  Follow-up in 3 to 5 years considered.  He presents today to discuss the need for surveillance colonoscopy.  Patient tells me that his GI review of systems is unremarkable.  He denies abdominal pain, change in bowel habits, rectal bleeding, or unexplained weight loss.  Review of blood work from January 2022 shows normal hemoglobin of 15.4.  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  The abdomen was unremarkable  REVIEW OF SYSTEMS:  All non-GI ROS negative unless otherwise stated in the HPI except for shortness of breath  Past Medical History:  Diagnosis Date   Allergy    seasonal   Asthma    Atrial fibrillation ([facility])    BPH (benign prostatic hypertrophy) with urinary obstruction    Cataract    removed both eyes   COPD (chronic obstructive pulmonary disease) ([facility])    Diverticulosis    Dysrhythmia    GERD (gastroesophageal reflux disease)    Hyperglycemia    Hyperlipidemia    Hypertension    Inguinal hernia    Neuromuscular disorder ([facility])    neuropathy in feet    Obesity    OSA (obstructive sleep apnea) 06/06/2013   Osteoarthritis    Sleep apnea    no cpap    Past Surgical History:  Procedure Laterality Date   A-FLUTTER ABLATION N/A 01/02/2020   Procedure: A-FLUTTER ABLATION;  Surgeon: Vickie Epley, MD;  Location: [place]  [facility];  Service: Cardiovascular;  Laterality: N/A;   APPENDECTOMY     arthroscopic knee surgery     CATARACT EXTRACTION  2012   bilateral   COLONOSCOPY     HAMMER TOE SURGERY     INGUINAL HERNIA REPAIR     right   POLYPECTOMY     REVERSE SHOULDER ARTHROPLASTY Right 03/26/2020   Procedure: REVERSE SHOULDER ARTHROPLASTY;  Surgeon: Netta Cedars, MD;  Location: [facility];  Service: Orthopedics;  Laterality: Right;  interscalane block   TONSILLECTOMY AND ADENOIDECTOMY     TOTAL HIP ARTHROPLASTY  2007   right    Social History Noah Huffman  reports that he quit smoking about 32 years ago. His smoking use included cigarettes. He has a 62.50 pack-year smoking history. He has never used smokeless tobacco. He reports current alcohol use of about 1.0 standard drink per week. He reports that he does not use drugs.  family history includes Brain cancer in his brother; Colon cancer (age of onset: 53) in his mother; Leukemia in his father; Pneumonia in his father; Pulmonary fibrosis in his brother; Transient ischemic attack in his father.  Allergies  Allergen Reactions   Macrobid [Nitrofurantoin]    Morphine And Related Rash       PHYSICAL EXAMINATION: Vital signs: BP (!) 120/56   Pulse 75   Ht 6\' 3"  (1.905 m)   Wt 257 lb 2 oz (116.6 kg)   SpO2 96%  BMI 32.14 kg/m   Constitutional: Obese but generally well-appearing, no acute distress Psychiatric: alert and oriented x3, cooperative Eyes: extraocular movements intact, anicteric, conjunctiva pink Mouth: oral pharynx moist, no lesions Neck: supple no lymphadenopathy Cardiovascular: heart regular rate and rhythm, no murmur Lungs: clear to auscultation bilaterally Abdomen: soft, obese, nontender, nondistended, no obvious ascites, no peritoneal signs, normal bowel sounds, no organomegaly Rectal: Omitted Extremities: no clubbing or cyanosis.  1+ lower extremity edema bilaterally Skin: no lesions on visible extremities Neuro: No focal  deficits.  Cranial nerves intact  ASSESSMENT:  1.  Personal history of multiple diminutive adenomatous colon polyps.  Previous examinations 2014 and 2019 as described.  The patient is asymptomatic with normal hemoglobin. 2.  Advanced COPD on home oxygen 3.  Advanced age and general medical problems   PLAN:  1.  We discussed the pros and cons of colonoscopy given his previous findings, current medical problems, and current age.  We also discussed the fact that there are no worrisome or alarm features in his history, physical examination or laboratory studies.  To this end, understanding the risks of proceeding with the procedure and the risks of not proceeding with the procedure, we mutually agreed to forego routine surveillance colonoscopy at this time and beyond. 2.  Resume general medical care with Dr. Virgina Jock.  I would NOT recommend routine Hemoccult studies in this patient. 3.  GI follow-up as needed

## 2020-12-13 ENCOUNTER — Ambulatory Visit (INDEPENDENT_AMBULATORY_CARE_PROVIDER_SITE_OTHER): Payer: PPO

## 2020-12-13 DIAGNOSIS — I483 Typical atrial flutter: Secondary | ICD-10-CM

## 2020-12-20 NOTE — Progress Notes (Signed)
[facility] Summary Report / Loop Recorder 

## 2020-12-24 ENCOUNTER — Ambulatory Visit (HOSPITAL_BASED_OUTPATIENT_CLINIC_OR_DEPARTMENT_OTHER): Payer: PPO | Attending: Internal Medicine | Admitting: Internal Medicine

## 2020-12-24 ENCOUNTER — Other Ambulatory Visit: Payer: Self-pay

## 2020-12-24 VITALS — Ht 75.0 in | Wt 255.0 lb

## 2020-12-24 DIAGNOSIS — J9611 Chronic respiratory failure with hypoxia: Secondary | ICD-10-CM | POA: Diagnosis present

## 2020-12-24 DIAGNOSIS — G4733 Obstructive sleep apnea (adult) (pediatric): Secondary | ICD-10-CM | POA: Diagnosis not present

## 2020-12-27 ENCOUNTER — Other Ambulatory Visit: Payer: Self-pay

## 2020-12-27 DIAGNOSIS — N401 Enlarged prostate with lower urinary tract symptoms: Secondary | ICD-10-CM | POA: Diagnosis not present

## 2020-12-27 DIAGNOSIS — R3915 Urgency of urination: Secondary | ICD-10-CM | POA: Diagnosis not present

## 2020-12-28 DIAGNOSIS — H903 Sensorineural hearing loss, bilateral: Secondary | ICD-10-CM | POA: Diagnosis not present

## 2021-01-02 DIAGNOSIS — J9611 Chronic respiratory failure with hypoxia: Secondary | ICD-10-CM | POA: Diagnosis not present

## 2021-01-02 NOTE — Procedures (Signed)
    Patient Name: Noah Huffman, Noah Huffman Date: 12/24/2020 Gender: Male D.O.B: [birth year] Age (years): 16 Referring Provider: Baird Lyons MD, ABSM Height (inches): 75 Interpreting Physician: Baird Lyons MD, ABSM Weight (lbs): 255 RPSGT: Earney [place] BMI: 32 MRN: [id number] Neck Size: 18.00  CLINICAL INFORMATION Sleep Study Type: NPSG Indication for sleep study: OSA Epworth Sleepiness Score: 3  SLEEP STUDY TECHNIQUE As per the AASM Manual for the Scoring of Sleep and Associated Events v2.3 (April 2016) with a hypopnea requiring 4% desaturations.  The channels recorded and monitored were frontal, central and occipital EEG, electrooculogram (EOG), submentalis EMG (chin), nasal and oral airflow, thoracic and abdominal wall motion, anterior tibialis EMG, snore microphone, electrocardiogram, and pulse oximetry.  MEDICATIONS Medications self-administered by patient taken the night of the study : none reported  SLEEP ARCHITECTURE The study was initiated at 11:01:01 PM and ended at 5:08:40 AM.  Sleep onset time was 17.4 minutes and the sleep efficiency was 75.9%%. The total sleep time was 279 minutes.  Stage REM latency was 208.0 minutes.  The patient spent 1.1%% of the night in stage N1 sleep, 79.0%% in stage N2 sleep, 0.0%% in stage N3 and 19.9% in REM.  Alpha intrusion was absent.  Supine sleep was 41.07%.  RESPIRATORY PARAMETERS The overall apnea/hypopnea index (AHI) was 9.0 per hour. There were 13 total apneas, including 13 obstructive, 0 central and 0 mixed apneas. There were 29 hypopneas and 16 RERAs.  The AHI during Stage REM sleep was 0.0 per hour.  AHI while supine was 22.0 per hour.  The mean oxygen saturation was 91.4%. The minimum SpO2 during sleep was 85.0%.  moderate snoring was noted during this study.  CARDIAC DATA The 2 lead EKG demonstrated sinus rhythm. The mean heart rate was 55.5 beats per minute. Other EKG findings include: None.  LEG  MOVEMENT DATA The total PLMS were 0 with a resulting PLMS index of 0.0. Associated arousal with leg movement index was 1.1 .  IMPRESSIONS - Mild obstructive sleep apnea occurred during this study (AHI = 9.0/h). - Mild oxygen desaturation was noted during this study (Min O2 = 85.0%). Mean 91.4%. - The patient snored with moderate snoring volume. Mouth breather. - No cardiac abnormalities were noted during this study. - Clinically significant periodic limb movements did not occur during sleep. No significant associated arousals.  DIAGNOSIS - Obstructive Sleep Apnea (G47.33)  RECOMMENDATIONS - Treatment for mild OSA may include observation, weight loss and sleep position off back. Otherr options including CPAP, a fitted oral appliance, or ENT evaluation, would be based on clinical judgment. - Positional therapy avoiding supine position during sleep. - Be careful with alcohol, sedatives and other CNS depressants that may worsen sleep apnea and disrupt normal sleep architecture. - Sleep hygiene should be reviewed to assess factors that may improve sleep quality. - Weight management and regular exercise should be initiated or continued if appropriate.  [Electronically signed] 01/02/2021 12:16 PM  Baird Lyons MD, [place], American Board of Sleep Medicine   NPI: [id number]                         [place], [place] [facility]  ELECTRONICALLY SIGNED ON:  01/02/2021, 12:11 PM [place] PH: [phone]   FX: [phone] [place]

## 2021-01-05 ENCOUNTER — Telehealth: Payer: Self-pay

## 2021-01-05 NOTE — Telephone Encounter (Signed)
LINQ alert received.  3 AF events from 10/18, longest duration 62min, HR 125 Burden 0.2%, no OAC noted on MAR Route to triage  AF ablation was completed on 01/01/21. Patient was on [facility] at this time but do not see in [facility] at this time. Called patient to clarify and check symptoms.   No answer, LMTCB.

## 2021-01-05 NOTE — Telephone Encounter (Signed)
Correction ~ AF ablation 01/02/2020.

## 2021-01-05 NOTE — Telephone Encounter (Signed)
Patient left a voicemail returning nurse call. His phone number is [phone].

## 2021-01-05 NOTE — Telephone Encounter (Signed)
Returned patients phone call.   Patient reports he was outside working in his yard and asymptomatic.   Patient confirmed he is NOT taking [place].   Routing to Dr. Quentin Ore for review and recommendations.

## 2021-01-06 ENCOUNTER — Ambulatory Visit (INDEPENDENT_AMBULATORY_CARE_PROVIDER_SITE_OTHER): Payer: PPO

## 2021-01-06 DIAGNOSIS — I483 Typical atrial flutter: Secondary | ICD-10-CM

## 2021-01-06 LAB — CUP PACEART REMOTE DEVICE CHECK
Date Time Interrogation Session: 20221019212130
Implantable Pulse Generator Implant Date: 20211122

## 2021-01-06 MED ORDER — APIXABAN 5 MG PO TABS
5.0000 mg | ORAL_TABLET | Freq: Two times a day (BID) | ORAL | 11 refills | Status: DC
Start: 2021-01-06 — End: 2021-02-24

## 2021-01-06 NOTE — Telephone Encounter (Signed)
Left detailed message for Pt advising to restart Eliquis 5 mg-one tablet by mouth twice a day.  Advised would discuss tx options at appt scheduled with Dr. Quentin Ore next month.  Prescription sent to pharmacy.  Await further needs.

## 2021-01-14 NOTE — Progress Notes (Signed)
[facility] Summary Report / Loop Recorder 

## 2021-01-19 DIAGNOSIS — Z1152 Encounter for screening for COVID-19: Secondary | ICD-10-CM | POA: Diagnosis not present

## 2021-01-19 DIAGNOSIS — R5383 Other fatigue: Secondary | ICD-10-CM | POA: Diagnosis not present

## 2021-01-19 DIAGNOSIS — J029 Acute pharyngitis, unspecified: Secondary | ICD-10-CM | POA: Diagnosis not present

## 2021-01-19 DIAGNOSIS — J479 Bronchiectasis, uncomplicated: Secondary | ICD-10-CM | POA: Diagnosis not present

## 2021-01-19 DIAGNOSIS — R051 Acute cough: Secondary | ICD-10-CM | POA: Diagnosis not present

## 2021-01-19 DIAGNOSIS — J441 Chronic obstructive pulmonary disease with (acute) exacerbation: Secondary | ICD-10-CM | POA: Diagnosis not present

## 2021-01-19 DIAGNOSIS — I4892 Unspecified atrial flutter: Secondary | ICD-10-CM | POA: Diagnosis not present

## 2021-01-19 DIAGNOSIS — R0981 Nasal congestion: Secondary | ICD-10-CM | POA: Diagnosis not present

## 2021-01-21 ENCOUNTER — Encounter: Payer: PPO | Admitting: Cardiology

## 2021-01-21 NOTE — Progress Notes (Signed)
HPI M former smoker ( 62.5 pk yrs) followed for OSA, complicated by A Fib, HTN, OSA, COPD Gold II/ Bronchiectasis, Asthma, GERD, BPH, Obesity, NPSG 12/24/20- AHI 9/ hr, desaturation to 85%, body weight 255 lbs ONOX 09/02/20- on 2l showed sustained hypoxemia and bradycardia on O2. ======================================================================  10/21/20- 80 yoM former smoker (62.5 pk yrs) for sleep evaluation courtesy of Dr [name] Medical problem list includes A Fib, HTN, OSA, COPD Gold II/ Bronchiectasis, Asthma, GERD, BPH, Obesity,  NPSG 04/27/13- AHI/ 16.5/ hr, desaturation to 87%, body weight 290 lbs, incomplete CPAP titration in available time ONOX 09/02/20- on 2l showed sustained hypoxemia and bradycardia on O2. O2 2L Sleep-/ [employer] Epworth score-6 Body weight today-257 lbs Has lost 40 lbs Covid vax- 2 Phizer He says he was told in past he didn't need CPAP. He reports losing 40 lbs in last 2 years. Has home O2 now- ok sleeping with it.  Hx nasal fracture. ENT recommended repair- never done.  01/24/21- 80yoM former smoker ( 62.5 pk yrs) followed for OSA, complicated by A Fib/ Eliquis, HTN, OSA, COPD Gold II/ Bronchiectasis, Asthma, GERD, BPH, Obesity, O2 2L sleep/ Adapt Breztri,  NPSG 12/24/20- AHI 9/ hr, desaturation to 85%, body weight 255 lbs Body weight today 252 lbs Dr Virgina Jock recently treated COPD exacerb w/ prednisone and abx. Feeling better Discussed sleep study- mild OSA. He has hx nasal fx/ claustrophobia and is worried not much nasal airflow. He had seen ENT, but didn't want surgery at age 37.   ROS-see HPI   + = positive Constitutional:    +weight loss, night sweats, fevers, chills, fatigue, lassitude. HEENT:    headaches, difficulty swallowing, tooth/dental problems, sore throat,       sneezing, itching, ear ache, +nasal congestion, post nasal drip, snoring CV:    +chest pain, orthopnea, PND, swelling in lower extremities, anasarca,                                    dizziness, palpitations Resp:   +shortness of breath with exertion or at rest.                +productive cough,   non-productive cough, coughing up of blood.              change in color of mucus.  wheezing.   Skin:    rash or lesions. GI:  No-   heartburn, indigestion, abdominal pain, nausea, vomiting, diarrhea,                 change in bowel habits, loss of appetite GU: dysuria, change in color of urine, no urgency or frequency.   flank pain. MS:   joint pain, +stiffness, decreased range of motion, back pain. Neuro-     nothing unusual Psych:  change in mood or affect.  depression or anxiety.   memory loss.  OBJ- Physical Exam General- Alert, Oriented, Affect-appropriate, Distress- none acute Skin- rash-none, lesions- none, excoriation- none Lymphadenopathy- none Head- atraumatic            Eyes- Gross vision intact, PERRLA, conjunctivae and secretions clear            Ears- Hearing, canals-normal            Nose- Clear, no-Septal dev, mucus, polyps, erosion, perforation             Throat- Mallampati II , mucosa clear , drainage- none, tonsils-  atrophic Neck- flexible , trachea midline, no stridor , thyroid nl, carotid no bruit Chest - symmetrical excursion , unlabored           Heart/CV- RRR today , no murmur , no gallop  , no rub, nl s1 s2                           - JVD- none , edema- none, stasis changes- none, varices- none           Lung-  wheeze- none, cough+raspy/ bronchitic , dullness-none, rub- none           Chest wall-  Abd-  Br/ Gen/ Rectal- Not done, not indicated Extrem- cyanosis- none, clubbing, none, atrophy- none, strength- nl Neuro- grossly intact to observation

## 2021-01-24 ENCOUNTER — Encounter: Payer: Self-pay | Admitting: Internal Medicine

## 2021-01-24 ENCOUNTER — Other Ambulatory Visit: Payer: Self-pay

## 2021-01-24 ENCOUNTER — Ambulatory Visit: Payer: PPO | Admitting: Internal Medicine

## 2021-01-24 DIAGNOSIS — J449 Chronic obstructive pulmonary disease, unspecified: Secondary | ICD-10-CM

## 2021-01-24 DIAGNOSIS — G4733 Obstructive sleep apnea (adult) (pediatric): Secondary | ICD-10-CM

## 2021-01-24 NOTE — Patient Instructions (Signed)
You have mild obstructive sleep apnea, averaging 9 apneas/ hour. Since you feel that you sleep well, and already sleep with oxygen, additional treatment for sleep apnea- such as nasal surgery for the old fracture, a fitted oral appliance, or CPAP, may not make enough difference to matter.   I suggest you ask your cardiologist how important he feels treating this mild sleep apnea would be for your heart.

## 2021-02-08 ENCOUNTER — Ambulatory Visit (INDEPENDENT_AMBULATORY_CARE_PROVIDER_SITE_OTHER): Payer: PPO

## 2021-02-08 DIAGNOSIS — I483 Typical atrial flutter: Secondary | ICD-10-CM

## 2021-02-08 LAB — CUP PACEART REMOTE DEVICE CHECK
Date Time Interrogation Session: 20221121211913
Implantable Pulse Generator Implant Date: 20211122

## 2021-02-10 NOTE — Assessment & Plan Note (Signed)
Recent exacerbation with known bronchiectasis. Plan- continue Breztri. Watch need to update CXR or add med- rescue inhaler or nebulizer.

## 2021-02-10 NOTE — Assessment & Plan Note (Signed)
Discussed indications for treating very mild OSA I think we could probably help him to desensitize to a CPAP mask or an oral appliance if he decides to try. He is going to wait to discuss first with cardiology to see if they feel it is important to his AFib at this point.

## 2021-02-17 NOTE — Progress Notes (Signed)
[facility] Summary Report / Loop Recorder 

## 2021-02-18 ENCOUNTER — Other Ambulatory Visit: Payer: Self-pay

## 2021-02-18 ENCOUNTER — Ambulatory Visit (INDEPENDENT_AMBULATORY_CARE_PROVIDER_SITE_OTHER): Payer: PPO | Admitting: Internal Medicine

## 2021-02-18 DIAGNOSIS — J449 Chronic obstructive pulmonary disease, unspecified: Secondary | ICD-10-CM

## 2021-02-18 DIAGNOSIS — Z836 Family history of other diseases of the respiratory system: Secondary | ICD-10-CM

## 2021-02-18 DIAGNOSIS — R053 Chronic cough: Secondary | ICD-10-CM

## 2021-02-18 DIAGNOSIS — R490 Dysphonia: Secondary | ICD-10-CM

## 2021-02-18 LAB — PULMONARY FUNCTION TEST
DL/VA % pred: 98 %
DL/VA: 3.78 ml/min/mmHg/L
DLCO cor % pred: 85 %
DLCO cor: 24.33 ml/min/mmHg
DLCO unc % pred: 85 %
DLCO unc: 24.33 ml/min/mmHg
FEF 25-75 Post: 1.33 L/sec
FEF 25-75 Pre: 1.33 L/sec
FEF2575-%Change-Post: 0 %
FEF2575-%Pred-Post: 52 %
FEF2575-%Pred-Pre: 52 %
FEV1-%Change-Post: 1 %
FEV1-%Pred-Post: 67 %
FEV1-%Pred-Pre: 66 %
FEV1-Post: 2.4 L
FEV1-Pre: 2.37 L
FEV1FVC-%Change-Post: 0 %
FEV1FVC-%Pred-Pre: 96 %
FEV6-%Change-Post: 0 %
FEV6-%Pred-Post: 72 %
FEV6-%Pred-Pre: 71 %
FEV6-Post: 3.36 L
FEV6-Pre: 3.34 L
FEV6FVC-%Change-Post: 0 %
FEV6FVC-%Pred-Post: 103 %
FEV6FVC-%Pred-Pre: 103 %
FVC-%Change-Post: 0 %
FVC-%Pred-Post: 70 %
FVC-%Pred-Pre: 69 %
FVC-Post: 3.46 L
FVC-Pre: 3.43 L
Post FEV1/FVC ratio: 69 %
Post FEV6/FVC ratio: 97 %
Pre FEV1/FVC ratio: 69 %
Pre FEV6/FVC Ratio: 97 %
RV % pred: 117 %
RV: 3.42 L
TLC % pred: 91 %
TLC: 7.35 L

## 2021-02-18 NOTE — Progress Notes (Signed)
PFT done today. 

## 2021-02-24 ENCOUNTER — Encounter: Payer: Self-pay | Admitting: Cardiology

## 2021-02-24 ENCOUNTER — Other Ambulatory Visit: Payer: Self-pay

## 2021-02-24 ENCOUNTER — Ambulatory Visit (INDEPENDENT_AMBULATORY_CARE_PROVIDER_SITE_OTHER): Payer: PPO | Admitting: Cardiology

## 2021-02-24 VITALS — BP 128/70 | HR 66 | Ht 75.0 in | Wt 260.8 lb

## 2021-02-24 DIAGNOSIS — I480 Paroxysmal atrial fibrillation: Secondary | ICD-10-CM

## 2021-02-24 DIAGNOSIS — I483 Typical atrial flutter: Secondary | ICD-10-CM | POA: Diagnosis not present

## 2021-02-24 MED ORDER — ASPIRIN EC 81 MG PO TBEC: 81.0000 mg | DELAYED_RELEASE_TABLET | Freq: Every day | ORAL | 3 refills | Status: AC

## 2021-02-24 NOTE — Progress Notes (Signed)
Electrophysiology Office Follow up Visit Note:    Date:  02/24/2021   ID:  [name], DOB May 26, 1940, MRN [id number]  PCP:  [name], MD  [facility] Cardiologist:  None  [facility] Electrophysiologist:  Vickie Epley, MD    Interval History:    Noah Huffman is a 80 y.o. male who presents for a follow up visit.  He has a history of atrial flutter though successfully ablated January 02, 2020.  He has a loop recorder for ongoing surveillance for atrial fibrillation.  Loop recorder monitoring did demonstrate atrial fibrillation back on January 04 2021.  Longest episode of A. fib was 54 minutes.  He did restart his Eliquis at that time.  He presents today for follow-up.  He tells me that he has not had a recurrent episode of atrial fibrillation since October 18.  Loop recorder interrogation confirms this.  He is interested in stopping the Eliquis again.       Past Medical History:  Diagnosis Date   Allergy    seasonal   Asthma    Atrial fibrillation (HCC)    BPH (benign prostatic hypertrophy) with urinary obstruction    Cataract    removed both eyes   COPD (chronic obstructive pulmonary disease) (HCC)    Diverticulosis    Dysrhythmia    GERD (gastroesophageal reflux disease)    Hyperglycemia    Hyperlipidemia    Hypertension    Inguinal hernia    Neuromuscular disorder (HCC)    neuropathy in feet    Obesity    OSA (obstructive sleep apnea) 06/06/2013   Osteoarthritis    Sleep apnea    no cpap    Past Surgical History:  Procedure Laterality Date   A-FLUTTER ABLATION N/A 01/02/2020   Procedure: A-FLUTTER ABLATION;  Surgeon: Vickie Epley, MD;  Location: [facility];  Service: Cardiovascular;  Laterality: N/A;   APPENDECTOMY     arthroscopic knee surgery     CATARACT EXTRACTION  2012   bilateral   COLONOSCOPY     HAMMER TOE SURGERY     INGUINAL HERNIA REPAIR     right   POLYPECTOMY     REVERSE SHOULDER ARTHROPLASTY Right 03/26/2020    Procedure: REVERSE SHOULDER ARTHROPLASTY;  Surgeon: Netta Cedars, MD;  Location: [facility];  Service: Orthopedics;  Laterality: Right;  interscalane block   TONSILLECTOMY AND ADENOIDECTOMY     TOTAL HIP ARTHROPLASTY  2007   right    Current Medications: Current Meds  Medication Sig   apixaban (ELIQUIS) 5 MG TABS tablet Take 1 tablet (5 mg total) by mouth 2 (two) times daily.   Budeson-Glycopyrrol-Formoterol (BREZTRI AEROSPHERE) 160-9-4.8 MCG/ACT AERO Inhale 2 puffs into the lungs in the morning and at bedtime.   fluticasone (FLONASE) 50 MCG/ACT nasal spray Place 1 spray into both nostrils daily.   gabapentin (NEURONTIN) 600 MG tablet Take 1,800 mg by mouth 3 (three) times daily.   losartan-hydrochlorothiazide (HYZAAR) 100-25 MG tablet Take 1 tablet by mouth daily.   Multiple Vitamins-Minerals (PRESERVISION AREDS 2) CAPS Take 1 capsule by mouth daily.    polyvinyl alcohol (LIQUIFILM TEARS) 1.4 % ophthalmic solution Place 1 drop into both eyes 2 (two) times daily.   sodium chloride (OCEAN) 0.65 % SOLN nasal spray Place 1 spray into both nostrils daily.   tamsulosin (FLOMAX) 0.4 MG CAPS capsule Take 0.4 mg by mouth daily.   Current Facility-Administered Medications for the 02/24/21 encounter (Office Visit) with Vickie Epley, MD  Medication   0.9 %  sodium chloride infusion     Allergies:   Macrobid [nitrofurantoin] and Morphine and related   Social History   Socioeconomic History   Marital status: Married    Spouse name: Not on file   Number of children: 2   Years of education: Not on file   Highest education level: Not on file  Occupational History   Occupation: retired  Tobacco Use   Smoking status: Former    Packs/day: 2.50    Years: 25.00    Pack years: 62.50    Types: Cigarettes    Quit date: 03/20/1988    Years since quitting: 32.9   Smokeless tobacco: Never  Vaping Use   Vaping Use: Never used  Substance and Sexual Activity   Alcohol use: Yes    Alcohol/week:  1.0 standard drink    Types: 1 Shots of liquor per week    Comment: 1-2 drinks per day or less    Drug use: No   Sexual activity: Not on file  Other Topics Concern   Not on file  Social History Narrative   Not on file   Social Determinants of Health   Financial Resource Strain: Not on file  Food Insecurity: Not on file  Transportation Needs: Not on file  Physical Activity: Not on file  Stress: Not on file  Social Connections: Not on file     Family History: The patient's family history includes Brain cancer in his brother; Colon cancer (age of onset: 41) in his mother; Leukemia in his father; Pneumonia in his father; Pulmonary fibrosis in his brother; Transient ischemic attack in his father. There is no history of Colon polyps, Esophageal cancer, Rectal cancer, or Stomach cancer.  ROS:   Please see the history of present illness.    All other systems reviewed and are negative.  EKGs/Labs/Other Studies Reviewed:    The following studies were reviewed today: Loop interrogations  EKG:  The ekg ordered today demonstrates sinus rhythm.  Recent Labs: 03/23/2020: BUN 20; Creatinine, Ser 0.61; Hemoglobin 15.4; Platelets 225; Potassium 3.8; Sodium 143  Recent Lipid Panel No results found for: CHOL, TRIG, HDL, CHOLHDL, VLDL, LDLCALC, LDLDIRECT  Physical Exam:    VS:  BP 128/70   Pulse 66   Ht 6\' 3"  (1.905 m)   Wt 260 lb 12.8 oz (118.3 kg)   SpO2 96%   BMI 32.60 kg/m     Wt Readings from Last 3 Encounters:  02/24/21 260 lb 12.8 oz (118.3 kg)  01/24/21 252 lb 6.4 oz (114.5 kg)  12/24/20 255 lb (115.7 kg)     GEN:  Well nourished, well developed in no acute distress HEENT: Normal NECK: No JVD; No carotid bruits LYMPHATICS: No lymphadenopathy CARDIAC: RRR, no murmurs, rubs, gallops RESPIRATORY:  Clear to auscultation without rales, wheezing or rhonchi  ABDOMEN: Soft, non-tender, non-distended MUSCULOSKELETAL:  No edema; No deformity  SKIN: Warm and dry NEUROLOGIC:   Alert and oriented x 3 PSYCHIATRIC:  Normal affect        ASSESSMENT:    1. Typical atrial flutter (HCC)   2. Paroxysmal atrial fibrillation (HCC)    PLAN:    In order of problems listed above:  #Typical atrial flutter and paroxysmal atrial fibrillation  Doing well after successful flutter ablation but did have 1 day of atrial fibrillation with 3 episodes, longest lasting less than 1 hour.  He restarted his Eliquis around that time but is interested in restopping it.  I  think it is reasonable for him to stop his Eliquis.  I will have him start an aspirin 81 mg by mouth once daily when he does this.  I told him that if we continue to monitor things and he has more atrial fibrillation he will need to restart his Eliquis and stay on it indefinitely.  Alternatively, could pursue left atrial appendage occlusion given he is not enthusiastic about long-term anticoagulation.   Medication Adjustments/Labs and Tests Ordered: Current medicines are reviewed at length with the patient today.  Concerns regarding medicines are outlined above.  No orders of the defined types were placed in this encounter.  No orders of the defined types were placed in this encounter.    Signed, Lars Mage, MD, [facility], [facility] 02/24/2021 3:59 PM    Electrophysiology [facility]

## 2021-02-24 NOTE — Patient Instructions (Addendum)
Medication Instructions:  Stop Eliquis Start Aspirin 81 mg  Your physician recommends that you continue on your current medications as directed. Please refer to the Current Medication list given to you today. *If you need a refill on your cardiac medications before your next appointment, please call your pharmacy*  Lab Work: None. If you have labs (blood work) drawn today and your tests are completely normal, you will receive your results only by: [facility] (if you have MyChart) OR A paper copy in the mail If you have any lab test that is abnormal or we need to change your treatment, we will call you to review the results.  Testing/Procedures: None.  Follow-Up: At [facility], you and your health needs are our priority.  As part of our continuing mission to provide you with exceptional heart care, we have created designated Provider Care Teams.  These Care Teams include your primary Cardiologist (physician) and Advanced Practice Providers (APPs -  Physician Assistants and Nurse Practitioners) who all work together to provide you with the care you need, when you need it.  Your physician wants you to follow-up in: 08/19/21 at 10:30 am with one of the following Advanced Practice Providers on your designated Care Team:    Noah Huffman, [place]  We recommend signing up for the patient portal called "MyChart".  Sign up information is provided on this After Visit Summary.  MyChart is used to connect with patients for Virtual Visits (Telemedicine).  Patients are able to view lab/test results, encounter notes, upcoming appointments, etc.  Non-urgent messages can be sent to your provider as well.   To learn more about what you can do with MyChart, go to [email or web address].    Any Other Special Instructions Will Be Listed Below (If Applicable).

## 2021-03-14 LAB — CUP PACEART REMOTE DEVICE CHECK
Date Time Interrogation Session: 20221224212040
Implantable Pulse Generator Implant Date: 20211122

## 2021-03-16 ENCOUNTER — Ambulatory Visit (INDEPENDENT_AMBULATORY_CARE_PROVIDER_SITE_OTHER): Payer: PPO

## 2021-03-16 DIAGNOSIS — I480 Paroxysmal atrial fibrillation: Secondary | ICD-10-CM

## 2021-03-25 ENCOUNTER — Other Ambulatory Visit: Payer: Self-pay | Admitting: Orthopedic Surgery

## 2021-03-25 DIAGNOSIS — M25511 Pain in right shoulder: Secondary | ICD-10-CM

## 2021-03-25 DIAGNOSIS — Z9889 Other specified postprocedural states: Secondary | ICD-10-CM

## 2021-03-28 NOTE — Progress Notes (Signed)
[facility] Summary Report / Loop Recorder 

## 2021-04-07 DIAGNOSIS — Z23 Encounter for immunization: Secondary | ICD-10-CM | POA: Diagnosis not present

## 2021-04-18 ENCOUNTER — Ambulatory Visit (INDEPENDENT_AMBULATORY_CARE_PROVIDER_SITE_OTHER): Payer: PPO

## 2021-04-18 DIAGNOSIS — I480 Paroxysmal atrial fibrillation: Secondary | ICD-10-CM | POA: Diagnosis not present

## 2021-04-18 LAB — CUP PACEART REMOTE DEVICE CHECK
Date Time Interrogation Session: 20230129231038
Implantable Pulse Generator Implant Date: 20211122

## 2021-04-26 NOTE — Progress Notes (Signed)
[facility] Summary Report / Loop Recorder 

## 2021-05-04 ENCOUNTER — Encounter: Payer: PPO | Admitting: *Deleted

## 2021-05-04 DIAGNOSIS — Z006 Encounter for examination for normal comparison and control in clinical research program: Secondary | ICD-10-CM

## 2021-05-04 NOTE — Research (Signed)
Define-AF Research Study   Patient presented information about the Define AF study, Patient was given the opportunity to as questions about the study.  Patient used the self driven method for signing consent.  Jasmine Pang, RN BSN [facility] Cardiovascular Research & Education Direct Line: [phone]

## 2021-05-09 ENCOUNTER — Telehealth: Payer: Self-pay

## 2021-05-09 NOTE — Telephone Encounter (Signed)
ILR alert report received. Battery status OK. Normal device function. No new symptom, tachy, brady, or pause episodes. One new AFL episode that was 8 minutes but not all the episode is AFL.  There appears to be a short burst of AFL detected.    Pt is s/p Afl ablation 01/02/20, ILR implant for ongoing surveillance of AF.  Last documented AF episode was in October 2022 lasting 54 minutes, [place] was restarted briefly and then stopped again in December.  Notes indicate may need to consider restarting if AF reoccurs.    Spoke with pt, he denies any symptoms at time of this episode.  He also notes that he is part of Define AF study now.  Advised I would forward info to Dr. Quentin Ore for review and recommendation.

## 2021-05-19 ENCOUNTER — Ambulatory Visit: Payer: PPO | Admitting: Nurse Practitioner

## 2021-05-19 ENCOUNTER — Other Ambulatory Visit: Payer: Self-pay

## 2021-05-19 ENCOUNTER — Ambulatory Visit (INDEPENDENT_AMBULATORY_CARE_PROVIDER_SITE_OTHER): Payer: PPO

## 2021-05-19 ENCOUNTER — Telehealth: Payer: Self-pay | Admitting: Nurse Practitioner

## 2021-05-19 ENCOUNTER — Encounter: Payer: Self-pay | Admitting: Nurse Practitioner

## 2021-05-19 VITALS — BP 136/80 | HR 83 | Ht 75.0 in | Wt 262.8 lb

## 2021-05-19 DIAGNOSIS — J449 Chronic obstructive pulmonary disease, unspecified: Secondary | ICD-10-CM

## 2021-05-19 DIAGNOSIS — J3089 Other allergic rhinitis: Secondary | ICD-10-CM | POA: Diagnosis not present

## 2021-05-19 DIAGNOSIS — I482 Chronic atrial fibrillation, unspecified: Secondary | ICD-10-CM

## 2021-05-19 DIAGNOSIS — G4733 Obstructive sleep apnea (adult) (pediatric): Secondary | ICD-10-CM | POA: Diagnosis not present

## 2021-05-19 DIAGNOSIS — J302 Other seasonal allergic rhinitis: Secondary | ICD-10-CM | POA: Diagnosis not present

## 2021-05-19 DIAGNOSIS — M5030 Other cervical disc degeneration, unspecified cervical region: Secondary | ICD-10-CM | POA: Diagnosis not present

## 2021-05-19 DIAGNOSIS — R059 Cough, unspecified: Secondary | ICD-10-CM | POA: Diagnosis not present

## 2021-05-19 DIAGNOSIS — Z96611 Presence of right artificial shoulder joint: Secondary | ICD-10-CM | POA: Diagnosis not present

## 2021-05-19 DIAGNOSIS — M542 Cervicalgia: Secondary | ICD-10-CM | POA: Diagnosis not present

## 2021-05-19 DIAGNOSIS — I4891 Unspecified atrial fibrillation: Secondary | ICD-10-CM | POA: Insufficient documentation

## 2021-05-19 DIAGNOSIS — D721 Eosinophilia, unspecified: Secondary | ICD-10-CM | POA: Diagnosis not present

## 2021-05-19 DIAGNOSIS — R0602 Shortness of breath: Secondary | ICD-10-CM | POA: Diagnosis not present

## 2021-05-19 DIAGNOSIS — J309 Allergic rhinitis, unspecified: Secondary | ICD-10-CM | POA: Insufficient documentation

## 2021-05-19 DIAGNOSIS — M47812 Spondylosis without myelopathy or radiculopathy, cervical region: Secondary | ICD-10-CM | POA: Diagnosis not present

## 2021-05-19 LAB — COMPREHENSIVE METABOLIC PANEL
ALT: 10 U/L (ref 0–53)
AST: 16 U/L (ref 0–37)
Albumin: 4.5 g/dL (ref 3.5–5.2)
Alkaline Phosphatase: 56 U/L (ref 39–117)
BUN: 18 mg/dL (ref 6–23)
CO2: 32 mEq/L (ref 19–32)
Calcium: 9.4 mg/dL (ref 8.4–10.5)
Chloride: 101 mEq/L (ref 96–112)
Creatinine, Ser: 0.9 mg/dL (ref 0.40–1.50)
GFR: 81.13 mL/min (ref >60.00)
Glucose, Bld: 127 mg/dL — ABNORMAL HIGH (ref 70–99)
Potassium: 3.7 mEq/L (ref 3.5–5.1)
Sodium: 140 mEq/L (ref 135–145)
Total Bilirubin: 1 mg/dL (ref 0.2–1.2)
Total Protein: 7.1 g/dL (ref 6.0–8.3)

## 2021-05-19 LAB — CBC WITH DIFFERENTIAL/PLATELET
Basophils Absolute: 0.1 10*3/uL (ref 0.0–0.1)
Basophils Relative: 1.4 % (ref 0.0–3.0)
Eosinophils Absolute: 0.8 10*3/uL — ABNORMAL HIGH (ref 0.0–0.7)
Eosinophils Relative: 10.5 % — ABNORMAL HIGH (ref 0.0–5.0)
HCT: 43.9 % (ref 39.0–52.0)
Hemoglobin: 15.1 g/dL (ref 13.0–17.0)
Lymphocytes Relative: 26.4 % (ref 12.0–46.0)
Lymphs Abs: 2.1 10*3/uL (ref 0.7–4.0)
MCHC: 34.4 g/dL (ref 30.0–36.0)
MCV: 92.9 fl (ref 78.0–100.0)
Monocytes Absolute: 0.5 10*3/uL (ref 0.1–1.0)
Monocytes Relative: 6.6 % (ref 3.0–12.0)
Neutro Abs: 4.3 10*3/uL (ref 1.4–7.7)
Neutrophils Relative %: 55.1 % (ref 43.0–77.0)
Platelets: 218 10*3/uL (ref 150.0–400.0)
RBC: 4.73 Mil/uL (ref 4.22–5.81)
RDW: 14 % (ref 11.5–15.5)
WBC: 7.8 10*3/uL (ref 4.0–10.5)

## 2021-05-19 MED ORDER — PREDNISONE 20 MG PO TABS: 40.0000 mg | ORAL_TABLET | Freq: Every day | ORAL | 0 refills | Status: AC

## 2021-05-19 MED ORDER — ALBUTEROL SULFATE HFA 108 (90 BASE) MCG/ACT IN AERS: 2.0000 | INHALATION_SPRAY | Freq: Four times a day (QID) | RESPIRATORY_TRACT | 6 refills | Status: DC | PRN

## 2021-05-19 MED ORDER — MONTELUKAST SODIUM 10 MG PO TABS
10.0000 mg | ORAL_TABLET | Freq: Every day | ORAL | 5 refills | Status: DC
Start: 2021-05-19 — End: 2022-04-05

## 2021-05-19 MED ORDER — GUAIFENESIN ER 600 MG PO TB12: 600.0000 mg | ORAL_TABLET | Freq: Two times a day (BID) | ORAL | 3 refills | Status: AC

## 2021-05-19 MED ORDER — BENZONATATE 200 MG PO CAPS: 200.0000 mg | ORAL_CAPSULE | Freq: Three times a day (TID) | ORAL | 0 refills | Status: DC | PRN

## 2021-05-19 MED ORDER — LEVOCETIRIZINE DIHYDROCHLORIDE 5 MG PO TABS: 5.0000 mg | ORAL_TABLET | Freq: Every evening | ORAL | 1 refills | Status: DC

## 2021-05-19 MED ORDER — AZELASTINE HCL 0.1 % NA SOLN: 2.0000 | Freq: Two times a day (BID) | NASAL | 3 refills | Status: DC

## 2021-05-19 MED ORDER — ROFLUMILAST 500 MCG PO TABS: ORAL_TABLET | ORAL | 5 refills | Status: DC

## 2021-05-19 NOTE — Progress Notes (Addendum)
$'@Patient'w$  ID: Noah Huffman, male    DOB: 05/01/1940, 81 y.o.   MRN: [id number]  Chief Complaint  Patient presents with   Cough   Shortness of Breath    Referring provider: [name], MD  HPI: 81 year old male, former smoker (62.5 pack year hx) followed for COPD Gold III, bronchiectasis, chronic cough, and OSA. He is a patient of Dr. Golden Pop and also followed by Dr. Annamaria Boots for sleep. Last seen in office on 01/24/2021. Past medical history significant for hx of asthma, HTN, a fib, GERD, OA, BPH, obesity, HLD.   TEST/EVENTS:  04/27/2013 NPSG: AHI 16.5/hr, desaturation to 87% 01/30/2020 HRCT chest: atherosclerosis, CAD. Mild, diffuse b/l bronchial wall thickening. Mild, band apearing scarring and volume loss of bases, L>R; similar in appearance.  09/02/2020 ONO: on 2 lpm, showed sustained hypoxemia and bradycardia  12/24/2020 NPSG: AHI 9/hr, desaturation to 85% 02/18/2021 PFTs: FVC 3.46 (70), FEV1 2.4 (67), ratio 69, TLC 91%, DLCOunc 85%. Moderate obstructive airway disease with normal diffusion capacity. No BD.   01/24/2021: OV with Dr. Annamaria Boots for f/u after sleep study. Discussed results. Concerned about nasal mask d/t not much airflow from previous nasal fx. Also concerned about claustrophobia with full face mask. Tx recently by PCP for AECOPD with prednisone and abx. Continued Breztri and PRN albuterol. Pt decided to wait to move forward with CPAP therapy or oral appliance until he discussed effects of untreated mild OSA on his a fib with his cardiologist. Continued on nocturnal 2 lpm supplemental O2.   05/19/2021: Today - acute visit Patient presents today for reported issues with his breathing and cough. He has had an ongoing productive cough over the past 2-3 months with recent worsening in frequency. He describes his sputum as white/creamy which is his baseline. No recent fevers or sick exposures. He has also had some progressive DOE and feels like his activity tolerance has declined. He  has been treated a few times for AECOPD by his PCP with prednisone and abx over the past six months. He also has had increasing allergy type symptoms including runny nose, itchy eyes, and scratchy throat over the past 1-2 weeks. He started taking an over the counter allergy medicine from [employer] and uses flonase and saline nasal sprays with some relief. He denies any orthopnea, PND, chest pain or swelling in his legs. No recent wheezing but does feel like his breathing is noisy. He continues on [place] Twice daily. Does not currently have a rescue inhaler available.   He previously met with Dr. Annamaria Boots to discuss mild OSA and tx options. He is still unsure what he would like to do and would like to discuss at his next visit. He currently is using 2 lpm supplemental O2 at night for nocturnal hypoxemia.   Allergies  Allergen Reactions   Macrobid [Nitrofurantoin]    Morphine And Related Rash    Immunization History  Administered Date(s) Administered   Fluad Quad(high Dose 65+) 12/02/2018, 12/24/2019   Influenza Split 04/13/2011, 03/20/2012, 04/18/2012   Influenza, Quadrivalent, Recombinant, Inj, Pf 12/24/2019   Influenza,inj,Quad PF,6+ Mos 01/14/2017   Influenza,inj,quad, With Preservative 12/11/2018   PFIZER(Purple Top)SARS-COV-2 Vaccination 05/15/2019, 06/10/2019   Pneumococcal Conjugate-13 02/28/2013, 04/21/2013, 05/06/2015   Pneumococcal Polysaccharide-23 01/23/2007, 05/09/2016   Td 03/21/2004, 08/25/2013, 08/25/2013   Unspecified SARS-COV-2 Vaccination 05/21/2019    Past Medical History:  Diagnosis Date   Allergy    seasonal   Asthma    Atrial fibrillation (HCC)    BPH (benign prostatic  hypertrophy) with urinary obstruction    Cataract    removed both eyes   COPD (chronic obstructive pulmonary disease) (HCC)    Diverticulosis    Dysrhythmia    GERD (gastroesophageal reflux disease)    Hyperglycemia    Hyperlipidemia    Hypertension    Inguinal hernia    Neuromuscular disorder  (HCC)    neuropathy in feet    Obesity    OSA (obstructive sleep apnea) 06/06/2013   Osteoarthritis    Sleep apnea    no cpap    Tobacco History: Social History   Tobacco Use  Smoking Status Former   Packs/day: 2.50   Years: 25.00   Pack years: 62.50   Types: Cigarettes   Quit date: 03/20/1988   Years since quitting: 33.1  Smokeless Tobacco Never   Counseling given: Not Answered   Outpatient Medications Prior to Visit  Medication Sig Dispense Refill   aspirin EC 81 MG tablet Take 1 tablet (81 mg total) by mouth daily. Swallow whole. 90 tablet 3   Budeson-Glycopyrrol-Formoterol (BREZTRI AEROSPHERE) 160-9-4.8 MCG/ACT AERO Inhale 2 puffs into the lungs in the morning and at bedtime. 10.7 g 5   fluticasone (FLONASE) 50 MCG/ACT nasal spray Place 1 spray into both nostrils daily.     gabapentin (NEURONTIN) 600 MG tablet Take 1,800 mg by mouth 3 (three) times daily.     losartan-hydrochlorothiazide (HYZAAR) 100-25 MG tablet Take 1 tablet by mouth daily.     Multiple Vitamins-Minerals (PRESERVISION AREDS 2) CAPS Take 1 capsule by mouth daily.      polyvinyl alcohol (LIQUIFILM TEARS) 1.4 % ophthalmic solution Place 1 drop into both eyes 2 (two) times daily.     sodium chloride (OCEAN) 0.65 % SOLN nasal spray Place 1 spray into both nostrils daily.     tamsulosin (FLOMAX) 0.4 MG CAPS capsule Take 0.4 mg by mouth daily.     rosuvastatin (CRESTOR) 10 MG tablet Take 10 mg by mouth at bedtime.     Facility-Administered Medications Prior to Visit  Medication Dose Route Frequency Provider Last Rate Last Admin   0.9 %  sodium chloride infusion  500 mL Intravenous Once Irene Shipper, MD         Review of Systems:   Constitutional: No weight loss or gain, night sweats, fevers, chills, fatigue, or lassitude. HEENT: No headaches, difficulty swallowing, tooth/dental problems, or sore throat. No sneezing,ear ache. +sinus pressure, nasal congestion, clear rhinorrhea, post nasal drip, itchy eyes,  scratchy throat CV:  No chest pain, orthopnea, PND, swelling in lower extremities, anasarca, dizziness, palpitations, syncope Resp: +shortness of breath with exertion; productive cough (increased frequency; sputum production at baseline); noisy breathing. No hemoptysis. No wheezing.  No chest wall deformity GI:  No heartburn, indigestion, abdominal pain, nausea, vomiting, diarrhea, change in bowel habits, loss of appetite, bloody stools.  GU: No dysuria, change in color of urine, urgency or frequency.  No flank pain, no hematuria  Skin: No rash, lesions, ulcerations MSK:  No joint pain or swelling.  No decreased range of motion.  No back pain. Neuro: No dizziness or lightheadedness.  Psych: No depression or anxiety. Mood stable.     Physical Exam:  BP 136/80    Pulse 83    Ht $R'6\' 3"'Zk$  (1.905 m)    Wt 262 lb 12.8 oz (119.2 kg)    SpO2 96%    BMI 32.85 kg/m   GEN: Pleasant, interactive, well-appearing; obese; in no acute distress HEENT:  Normocephalic and atraumatic.  EACs patent bilaterally. TM pearly gray with present light reflex bilaterally. PERRLA. Sclera white. Nasal turbinates erythematous, moist and patent bilaterally. Clear rhinorrhea present. Oropharynx erythematous and moist, without exudate or edema. No lesions, ulcerations NECK:  Supple w/ fair ROM. No JVD present. Normal carotid impulses w/o bruits. Thyroid symmetrical with no goiter or nodules palpated. No lymphadenopathy.   CV: RRR, no m/r/g, no peripheral edema. Pulses intact, +2 bilaterally. No cyanosis, pallor or clubbing. PULMONARY:  Unlabored, regular breathing. Minimal scattered rhonchi bilaterally A&P. No accessory muscle use. No dullness to percussion. GI: BS present and normoactive. Soft, non-tender to palpation. No organomegaly or masses detected. No CVA tenderness. MSK: No erythema, warmth or tenderness. Cap refil <2 sec all extrem. No deformities or joint swelling noted.  Neuro: A/Ox3. No focal deficits noted.   Skin:  Warm, no lesions or rashe Psych: Normal affect and behavior. Judgement and thought content appropriate.     Lab Results:  CBC    Component Value Date/Time   WBC 7.8 05/19/2021 1130   RBC 4.73 05/19/2021 1130   HGB 15.1 05/19/2021 1130   HGB 14.1 12/31/2019 1004   HCT 43.9 05/19/2021 1130   HCT 41.9 12/31/2019 1004   PLT 218.0 05/19/2021 1130   PLT 204 12/31/2019 1004   MCV 92.9 05/19/2021 1130   MCV 91 12/31/2019 1004   MCH 31.4 03/23/2020 1140   MCHC 34.4 05/19/2021 1130   RDW 14.0 05/19/2021 1130   RDW 14.6 12/31/2019 1004   LYMPHSABS 2.1 05/19/2021 1130   LYMPHSABS 2.3 12/31/2019 1004   MONOABS 0.5 05/19/2021 1130   EOSABS 0.8 (H) 05/19/2021 1130   EOSABS 0.4 12/31/2019 1004   BASOSABS 0.1 05/19/2021 1130   BASOSABS 0.0 12/31/2019 1004    BMET    Component Value Date/Time   NA 140 05/19/2021 1130   NA 140 12/31/2019 1004   K 3.7 05/19/2021 1130   CL 101 05/19/2021 1130   CO2 32 05/19/2021 1130   GLUCOSE 127 (H) 05/19/2021 1130   BUN 18 05/19/2021 1130   BUN 21 12/31/2019 1004   CREATININE 0.90 05/19/2021 1130   CALCIUM 9.4 05/19/2021 1130   GFRNONAA >60 03/23/2020 1140   GFRAA 102 12/31/2019 1004    BNP No results found for: BNP   Imaging:  DG Chest 2 View  Result Date: 05/19/2021 CLINICAL DATA:  81 year old male with productive cough and shortness of breath. EXAM: CHEST - 2 VIEW COMPARISON:  High-resolution chest CT levin 1221 and earlier. FINDINGS: PA and lateral views. New left chest cardiac event recorder. Normal cardiac size and mediastinal contours. Stable lung volumes. Visualized tracheal air column is within normal limits. No pneumothorax, pulmonary edema, pleural effusion or confluent pulmonary opacity. Right total shoulder arthroplasty is new since November. No acute osseous abnormality identified. Negative visible bowel gas. IMPRESSION: No acute cardiopulmonary abnormality. Electronically Signed   By: Genevie Ann M.D.   On: 05/19/2021 11:52       PFT Results Latest Ref Rng & Units 02/18/2021 03/15/2020 01/21/2019 10/08/2017  FVC-Pre L 3.43 3.44 3.73 4.03  FVC-Predicted Pre % 69 69 74 79  FVC-Post L 3.46 3.37 3.69 3.68  FVC-Predicted Post % 70 67 73 72  Pre FEV1/FVC % % 69 73 64 71  Post FEV1/FCV % % 69 73 70 72  FEV1-Pre L 2.37 2.51 2.40 2.85  FEV1-Predicted Pre % 66 69 65 77  FEV1-Post L 2.40 2.45 2.58 2.63  DLCO uncorrected ml/min/mmHg 24.33 28.29 25.55 28.61  DLCO UNC% %  85 98 88 73  DLCO corrected ml/min/mmHg 24.33 28.29 - -  DLCO COR %Predicted % 85 98 - -  DLVA Predicted % 98 108 108 83  TLC L 7.35 7.20 7.91 6.82  TLC % Predicted % 91 89 98 84  RV % Predicted % 117 104 139 91    Lab Results  Component Value Date   NITRICOXIDE 22 08/24/2017        Assessment & Plan:   COPD with chronic bronchitis ([place]) Continues to experience moderate to high symptom burden. Has been treated multiple times by PCP for AECOPD in past 6 months. Today with mild exacerbation. Short prednisone burst. Continue triple therapy regimen. Rx sent for albuterol PRN - educated on use. CXR today to r/o superimposed infectioun; although, doubt this is the cause given length of symptoms. Initially, we discussed therapy with Daliresp as symptoms were suspected to be poorly controlled chronic bronchitis with COPD; however, CBC with diff came back with significant eosinophilia (0.8). Started singulair 10 mg At bedtime. Suspect cough aggravated by allergic rhinitis/asthma - increase postnasal drainage and cough control. Mucolytic therapy. Will obtain FeNO at f/u.  Patient Instructions  -Continue Albuterol inhaler 2 puffs or 3 mL neb every 6 hours as needed for shortness of breath or wheezing. Notify if symptoms persist despite rescue inhaler/neb use. -Continue Breztri 2 puffs Twice daily. Brush tongue and rinse mouth well afterwards. Use with spacer.  -Continue flonase 2 spray each nostril daily -Continue saline nasal spray 1 spray each nostril  1-2 times a day  -Continue on supplemental oxygen 2 lpm at night  -Mucinex 600 mg Twice daily for chest congestion -Astelin nasal spray 2 sprays each nostril Twice daily  -Stop the allergy medicine you have at home. Start Xyzal 10 mg daily for allergies  -Singulair 10 mg At bedtime  -Prednisone 40 mg for 5 days. Take in AM with food.   Notify if worsening breathlessness, cough, mucus production, fatigue, or wheezing occurs.  Maintain up to date vaccinations, including influenza, COVID, and pneumococcal.  Wash your hands often and avoid sick exposures.  Encouraged masking in crowds.  Avoid triggers, when possible.  Exercise, as tolerated. Notify if worsening symptoms upon exertion occur.   Chest x ray today. We will call you if there is any abnormal.   Follow up in two weeks with Dr. [name]. If symptoms do not improve or worsen, please contact office for sooner follow up or seek emergency care.    Allergic rhinitis Persistent symptoms despite OTC antihistamine and flonase. Will trial with Xyzal. Addition of astelin. Continue saline nasal spray. IgE today - if elevated and no improvement with current regimen, could consider referral to allergist.   OSA (obstructive sleep apnea) Discussed treatment options to mild OSA including oral appliance vs CPAP. Decided he'd prefer to wait to discuss this until his other symptoms have improved.   Atrial fibrillation ([place]) Rate controlled at visit. Follow up with cardiology as scheduled.    I spent 35 minutes of dedicated to the care of this patient on the date of this encounter to include pre-visit review of records, face-to-face time with the patient discussing conditions above, post visit ordering of testing, clinical documentation with the electronic health record, making appropriate referrals as documented, and communicating necessary findings to members of the patients care team.  Clayton Bibles, NP 05/19/2021  Pt aware and understands  NP's role.

## 2021-05-19 NOTE — Assessment & Plan Note (Addendum)
Continues to experience moderate to high symptom burden. Has been treated multiple times by PCP for AECOPD in past 6 months. Today with mild exacerbation. Short prednisone burst. Continue triple therapy regimen. Rx sent for albuterol PRN - educated on use. CXR today to r/o superimposed infectioun; although, doubt this is the cause given length of symptoms. ?Initially, we discussed therapy with Daliresp as symptoms were suspected to be poorly controlled chronic bronchitis with COPD; however, CBC with diff came back with significant eosinophilia (0.8). Started singulair 10 mg At bedtime. Suspect cough aggravated by allergic rhinitis/asthma - increase postnasal drainage and cough control. Mucolytic therapy. Will obtain FeNO at f/u. ? ?Patient Instructions  ?-Continue Albuterol inhaler 2 puffs or 3 mL neb every 6 hours as needed for shortness of breath or wheezing. Notify if symptoms persist despite rescue inhaler/neb use. ?-Continue Breztri 2 puffs Twice daily. Brush tongue and rinse mouth well afterwards. Use with spacer.  ?-Continue flonase 2 spray each nostril daily ?-Continue saline nasal spray 1 spray each nostril 1-2 times a day  ?-Continue on supplemental oxygen 2 lpm at night ? ?-Mucinex 600 mg Twice daily for chest congestion ?-Astelin nasal spray 2 sprays each nostril Twice daily  ?-Stop the allergy medicine you have at home. Start Xyzal 10 mg daily for allergies  ?-Singulair 10 mg At bedtime  ?-Prednisone 40 mg for 5 days. Take in AM with food.  ? ?Notify if worsening breathlessness, cough, mucus production, fatigue, or wheezing occurs.  ?Maintain up to date vaccinations, including influenza, COVID, and pneumococcal.  ?Wash your hands often and avoid sick exposures.  ?Encouraged masking in crowds.  ?Avoid triggers, when possible.  ?Exercise, as tolerated. Notify if worsening symptoms upon exertion occur.  ? ?Chest x ray today. We will call you if there is any abnormal.  ? ?Follow up in two weeks with Dr.  [name]. If symptoms do not improve or worsen, please contact office for sooner follow up or seek emergency care. ? ? ?

## 2021-05-19 NOTE — Progress Notes (Signed)
CXR without any evidence of superimposed infection or acute process.

## 2021-05-19 NOTE — Assessment & Plan Note (Signed)
Discussed treatment options to mild OSA including oral appliance vs CPAP. Decided he'd prefer to wait to discuss this until his other symptoms have improved.  ?

## 2021-05-19 NOTE — Assessment & Plan Note (Addendum)
Persistent symptoms despite OTC antihistamine and flonase. Will trial with Xyzal. Addition of astelin. Continue saline nasal spray. IgE today - if elevated and no improvement with current regimen, could consider referral to allergist.  ?

## 2021-05-19 NOTE — Telephone Encounter (Signed)
I called the patient and per Noah Huffman he did not present with a Bronchospasm on exam and he was to take the Prednisone. He voices understanding. Nothing further needed. ?

## 2021-05-19 NOTE — Patient Instructions (Addendum)
-  Continue Albuterol inhaler 2 puffs or 3 mL neb every 6 hours as needed for shortness of breath or wheezing. Notify if symptoms persist despite rescue inhaler/neb use. ?-Continue Breztri 2 puffs Twice daily. Brush tongue and rinse mouth well afterwards. Use with spacer.  ?-Continue flonase 2 spray each nostril daily ?-Continue saline nasal spray 1 spray each nostril 1-2 times a day  ?-Continue on supplemental oxygen 2 lpm at night ? ?-Mucinex 600 mg Twice daily for chest congestion ?-Astelin nasal spray 2 sprays each nostril Twice daily  ?-Stop the allergy medicine you have at home. Start Xyzal 10 mg daily for allergies  ?-Singulair 10 mg At bedtime  ?-Prednisone 40 mg for 5 days. Take in AM with food.  ? ?Notify if worsening breathlessness, cough, mucus production, fatigue, or wheezing occurs.  ?Maintain up to date vaccinations, including influenza, COVID, and pneumococcal.  ?Wash your hands often and avoid sick exposures.  ?Encouraged masking in crowds.  ?Avoid triggers, when possible.  ?Exercise, as tolerated. Notify if worsening symptoms upon exertion occur.  ? ?Chest x ray today. We will call you if there is any abnormal.  ? ?Follow up in two weeks with Dr. [name]. If symptoms do not improve or worsen, please contact office for sooner follow up or seek emergency care. ?

## 2021-05-19 NOTE — Progress Notes (Signed)
Contacted pt to discuss lab findings. CBC with diff with significantly elevated eosinophils (relative 10.5%, absolute 0.8). Awaiting IgE. CMET unremarkable. Advised to hold off on starting [place] - contacted pharmacy to cancel order. Going to start pt on Singulair 10 mg At bedtime. Continue with other previously discussed interventions. Follow up changed to 2 weeks with FeNO. Pt instructions updated on AVS so he can review in his MyChart. Pt verbalized understanding.  ?

## 2021-05-19 NOTE — Assessment & Plan Note (Signed)
Rate controlled at visit. Follow up with cardiology as scheduled.  ?

## 2021-05-19 NOTE — Addendum Note (Signed)
Addended by: Clayton Bibles on: 05/19/2021 02:01 PM ? ? Modules accepted: Orders ? ?

## 2021-05-20 LAB — IGE: IgE (Immunoglobulin E), Serum: 258 kU/L — ABNORMAL HIGH (ref <=114)

## 2021-05-23 ENCOUNTER — Ambulatory Visit (INDEPENDENT_AMBULATORY_CARE_PROVIDER_SITE_OTHER): Payer: PPO

## 2021-05-23 DIAGNOSIS — I480 Paroxysmal atrial fibrillation: Secondary | ICD-10-CM | POA: Diagnosis not present

## 2021-05-23 LAB — CUP PACEART REMOTE DEVICE CHECK
Date Time Interrogation Session: 20230306121509
Implantable Pulse Generator Implant Date: 20211122

## 2021-05-24 ENCOUNTER — Ambulatory Visit: Payer: PPO | Admitting: Internal Medicine

## 2021-05-25 DIAGNOSIS — I10 Essential (primary) hypertension: Secondary | ICD-10-CM | POA: Diagnosis not present

## 2021-05-25 DIAGNOSIS — Z125 Encounter for screening for malignant neoplasm of prostate: Secondary | ICD-10-CM | POA: Diagnosis not present

## 2021-05-25 DIAGNOSIS — E785 Hyperlipidemia, unspecified: Secondary | ICD-10-CM | POA: Diagnosis not present

## 2021-05-31 DIAGNOSIS — N401 Enlarged prostate with lower urinary tract symptoms: Secondary | ICD-10-CM | POA: Diagnosis not present

## 2021-05-31 DIAGNOSIS — Z1389 Encounter for screening for other disorder: Secondary | ICD-10-CM | POA: Diagnosis not present

## 2021-05-31 DIAGNOSIS — Z0000 Encounter for general adult medical examination without abnormal findings: Secondary | ICD-10-CM | POA: Diagnosis not present

## 2021-05-31 DIAGNOSIS — R82998 Other abnormal findings in urine: Secondary | ICD-10-CM | POA: Diagnosis not present

## 2021-05-31 DIAGNOSIS — M25611 Stiffness of right shoulder, not elsewhere classified: Secondary | ICD-10-CM | POA: Diagnosis not present

## 2021-05-31 DIAGNOSIS — G609 Hereditary and idiopathic neuropathy, unspecified: Secondary | ICD-10-CM | POA: Diagnosis not present

## 2021-05-31 DIAGNOSIS — R739 Hyperglycemia, unspecified: Secondary | ICD-10-CM | POA: Diagnosis not present

## 2021-05-31 DIAGNOSIS — M25511 Pain in right shoulder: Secondary | ICD-10-CM | POA: Diagnosis not present

## 2021-05-31 DIAGNOSIS — I10 Essential (primary) hypertension: Secondary | ICD-10-CM | POA: Diagnosis not present

## 2021-05-31 DIAGNOSIS — D692 Other nonthrombocytopenic purpura: Secondary | ICD-10-CM | POA: Diagnosis not present

## 2021-05-31 DIAGNOSIS — I4892 Unspecified atrial flutter: Secondary | ICD-10-CM | POA: Diagnosis not present

## 2021-05-31 DIAGNOSIS — Z1331 Encounter for screening for depression: Secondary | ICD-10-CM | POA: Diagnosis not present

## 2021-05-31 DIAGNOSIS — J479 Bronchiectasis, uncomplicated: Secondary | ICD-10-CM | POA: Diagnosis not present

## 2021-05-31 DIAGNOSIS — M159 Polyosteoarthritis, unspecified: Secondary | ICD-10-CM | POA: Diagnosis not present

## 2021-05-31 DIAGNOSIS — R293 Abnormal posture: Secondary | ICD-10-CM | POA: Diagnosis not present

## 2021-05-31 DIAGNOSIS — E785 Hyperlipidemia, unspecified: Secondary | ICD-10-CM | POA: Diagnosis not present

## 2021-05-31 DIAGNOSIS — M542 Cervicalgia: Secondary | ICD-10-CM | POA: Diagnosis not present

## 2021-05-31 DIAGNOSIS — J449 Chronic obstructive pulmonary disease, unspecified: Secondary | ICD-10-CM | POA: Diagnosis not present

## 2021-06-02 ENCOUNTER — Other Ambulatory Visit: Payer: Self-pay

## 2021-06-02 ENCOUNTER — Encounter: Payer: Self-pay | Admitting: Nurse Practitioner

## 2021-06-02 ENCOUNTER — Ambulatory Visit: Payer: PPO | Admitting: Nurse Practitioner

## 2021-06-02 VITALS — BP 142/80 | HR 66 | Wt 267.0 lb

## 2021-06-02 DIAGNOSIS — J449 Chronic obstructive pulmonary disease, unspecified: Secondary | ICD-10-CM | POA: Diagnosis not present

## 2021-06-02 DIAGNOSIS — I482 Chronic atrial fibrillation, unspecified: Secondary | ICD-10-CM | POA: Diagnosis not present

## 2021-06-02 DIAGNOSIS — D721 Eosinophilia, unspecified: Secondary | ICD-10-CM | POA: Diagnosis not present

## 2021-06-02 DIAGNOSIS — H353131 Nonexudative age-related macular degeneration, bilateral, early dry stage: Secondary | ICD-10-CM | POA: Diagnosis not present

## 2021-06-02 DIAGNOSIS — J3089 Other allergic rhinitis: Secondary | ICD-10-CM | POA: Diagnosis not present

## 2021-06-02 DIAGNOSIS — Z961 Presence of intraocular lens: Secondary | ICD-10-CM | POA: Diagnosis not present

## 2021-06-02 MED ORDER — PREDNISONE 10 MG PO TABS: ORAL_TABLET | ORAL | 0 refills | Status: DC

## 2021-06-02 NOTE — Progress Notes (Signed)
[facility] Summary Report / Loop Recorder 

## 2021-06-02 NOTE — Patient Instructions (Addendum)
-  Continue Albuterol inhaler 2 puffs or 3 mL neb every 6 hours as needed for shortness of breath or wheezing. Notify if symptoms persist despite rescue inhaler/neb use. ?-Continue Breztri 2 puffs Twice daily. Brush tongue and rinse mouth well afterwards. Use with spacer.  ?-Continue flonase 2 spray each nostril daily ?-Continue saline nasal spray 1 spray each nostril 1-2 times a day  ?-Continue on supplemental oxygen 2 lpm at night ?-Continue Mucinex 600 mg Twice daily for chest congestion ?-Continue Astelin nasal spray 2 sprays each nostril Twice daily  ? ?Ensure you are taking ?-Xyzal (levocetirizine) 10 mg daily for allergies in the morning ?-Singulair (montelukast) 10 mg At bedtime  ? ?Prednisone taper. 4 tabs for 2 days, then 3 tabs for 2 days, 2 tabs for 2 days, then 1 tab for 2 days, then stop. Take in AM with food.  ? ?Referral to allergist today ?  ?Follow up in 4-6 weeks with Dr. [name]. If symptoms do not improve or worsen, please contact office for sooner follow up or seek emergency care. ?

## 2021-06-02 NOTE — Progress Notes (Signed)
? ?'@Patient'$  ID: [name], male    DOB: 04-24-1940, 81 y.o.   MRN: [id number] ? ?No chief complaint on file. ? ? ?Referring provider: ?[name], MD ? ?HPI: ?81 year old male, former smoker (62.5-pack-year history) followed for COPD Gold 3, bronchiectasis, chronic cough and OSA.  He is a patient of Dr. Golden Pop and also followed by Dr. Annamaria Boots for sleep.  Last seen in office on 05/19/2021 by Derrin Currey NP.  Past medical history significant for history of asthma, hypertension, A-fib, GERD, OA, BPH, obesity, HLD. ? ?TEST/EVENTS:  ?04/27/2013 and PSG: AHI 16.5/h, desaturation 87% ?01/30/2020 HRCT chest: Atherosclerosis, CAD.  Mild diffuse bilateral bronchial wall thickening.  Mild band appearing scarring and volume loss of bases, left greater than right; similar in appearance when compared to previous. ?09/03/2019 echo: On 2 L/min, showed sustained hypoxemia and bradycardia ?12/24/2020 NPSG: AHI 9/h, desaturation 85% ?02/18/2021 PFTs: FVC 3.46 (70), FEV1 2.4 (67, ratio 69, TLC 91%, DLCO uncorrected 85%.  Moderate obstructive airway disease with normal diffusion capacity.  No BD ? ?01/24/2021: OV with Dr. Annamaria Boots for f/u after sleep study. Discussed results. Concerned about nasal mask d/t not much airflow from previous nasal fx. Also concerned about claustrophobia with full face mask. Tx recently by PCP for AECOPD with prednisone and abx. Continued Breztri and PRN albuterol. Pt decided to wait to move forward with CPAP therapy or oral appliance until he discussed effects of untreated mild OSA on his a fib with his cardiologist. Continued on nocturnal 2 lpm supplemental O2. ? ?05/19/2021: OV with Ziasia Lenoir NP.  Worsening shortness of breath and ongoing productive cough over the last 2 to 3 months.  Felt as though activity intolerance had declined.  Had been treated a few times for AECOPD by his PCP with prednisone and antibiotics over the last 6 months.  Also has some increasing allergy type symptoms including runny nose, itchy eyes  and scratchy throat.  Started take over-the-counter allergy medicine from [facility] and uses Flonase and saline nasal spray.  Previously had met with Dr. Annamaria Boots to discuss mild OSA and treatment options.  Still unsure at this visit what he would like to do next.  Continued on 2 L/min supplemental O2 at night.  Continued on triple therapy regimen with Breztri.  CBC with differential and IgE obtained which showed significant eosinophilia and elevated IgE.  Started on Singulair and Xyzal.  Provided with short prednisone burst.  Plans to obtain FeNO at follow-up.  CXR without any evidence of superimposed infection ? ?06/02/2021: Today ?Patient presents today for follow-up.  Feels as though his breathing is slightly improved although he does still continue to experience shortness of breath from exertion.  Feels as though he still has activity limitations.  Does feel like his cough has improved and is not as frequent as it used to be.  Production has mostly resolved.  Does continue to have persistent allergy symptoms with itchy eyes and sneezing.  Started on Xyzal and Singulair at his last visit.  Denies lower extremity edema, orthopnea or PND, hemoptysis.  Has seen an allergist in the past.  Continued on Breztri twice daily.  Rarely uses albuterol. ? ?FeNO 23 ppb ? ?Allergies  ?Allergen Reactions  ? Macrobid [Nitrofurantoin]   ? Morphine And Related Rash  ? ? ?Immunization History  ?Administered Date(s) Administered  ? Fluad Quad(high Dose 65+) 12/02/2018, 12/24/2019  ? Influenza Split 04/13/2011, 03/20/2012, 04/18/2012  ? Influenza, Quadrivalent, Recombinant, Inj, Pf 12/24/2019  ? Influenza,inj,Quad PF,6+ Mos 01/14/2017  ? Influenza,inj,quad,  With Preservative 12/11/2018  ? PFIZER(Purple Top)SARS-COV-2 Vaccination 05/15/2019, 06/10/2019  ? Pneumococcal Conjugate-13 02/28/2013, 04/21/2013, 05/06/2015  ? Pneumococcal Polysaccharide-23 01/23/2007, 05/09/2016  ? Td 03/21/2004, 08/25/2013, 08/25/2013  ? Unspecified SARS-COV-2  Vaccination 05/21/2019  ? ? ?Past Medical History:  ?Diagnosis Date  ? Allergy   ? seasonal  ? Asthma   ? Atrial fibrillation ([place])   ? BPH (benign prostatic hypertrophy) with urinary obstruction   ? Cataract   ? removed both eyes  ? COPD (chronic obstructive pulmonary disease) ([place])   ? Diverticulosis   ? Dysrhythmia   ? GERD (gastroesophageal reflux disease)   ? Hyperglycemia   ? Hyperlipidemia   ? Hypertension   ? Inguinal hernia   ? Neuromuscular disorder ([place])   ? neuropathy in feet   ? Obesity   ? OSA (obstructive sleep apnea) 06/06/2013  ? Osteoarthritis   ? Sleep apnea   ? no cpap  ? ? ?Tobacco History: ?Social History  ? ?Tobacco Use  ?Smoking Status Former  ? Packs/day: 2.50  ? Years: 25.00  ? Pack years: 62.50  ? Types: Cigarettes  ? Quit date: 03/20/1988  ? Years since quitting: 33.2  ?Smokeless Tobacco Never  ? ?Counseling given: Not Answered ? ? ?Outpatient Medications Prior to Visit  ?Medication Sig Dispense Refill  ? albuterol (VENTOLIN HFA) 108 (90 Base) MCG/ACT inhaler Inhale 2 puffs into the lungs every 6 (six) hours as needed for wheezing or shortness of breath. 8 g 6  ? aspirin EC 81 MG tablet Take 1 tablet (81 mg total) by mouth daily. Swallow whole. 90 tablet 3  ? azelastine (ASTELIN) 0.1 % nasal spray Place 2 sprays into both nostrils 2 (two) times daily. Use in each nostril as directed 30 mL 3  ? benzonatate (TESSALON) 200 MG capsule Take 1 capsule (200 mg total) by mouth 3 (three) times daily as needed for cough. 30 capsule 0  ? Budeson-Glycopyrrol-Formoterol (BREZTRI AEROSPHERE) 160-9-4.8 MCG/ACT AERO Inhale 2 puffs into the lungs in the morning and at bedtime. 10.7 g 5  ? fluticasone (FLONASE) 50 MCG/ACT nasal spray Place 1 spray into both nostrils daily.    ? gabapentin (NEURONTIN) 600 MG tablet Take 1,800 mg by mouth 3 (three) times daily.    ? guaiFENesin (MUCINEX) 600 MG 12 hr tablet Take 1 tablet (600 mg total) by mouth 2 (two) times daily. 60 tablet 3  ? levocetirizine (XYZAL) 5 MG  tablet Take 1 tablet (5 mg total) by mouth every evening. 90 tablet 1  ? losartan-hydrochlorothiazide (HYZAAR) 100-25 MG tablet Take 1 tablet by mouth daily.    ? montelukast (SINGULAIR) 10 MG tablet Take 1 tablet (10 mg total) by mouth at bedtime. 30 tablet 5  ? Multiple Vitamins-Minerals (PRESERVISION AREDS 2) CAPS Take 1 capsule by mouth daily.     ? polyvinyl alcohol (LIQUIFILM TEARS) 1.4 % ophthalmic solution Place 1 drop into both eyes 2 (two) times daily.    ? rosuvastatin (CRESTOR) 10 MG tablet Take 10 mg by mouth at bedtime.    ? sodium chloride (OCEAN) 0.65 % SOLN nasal spray Place 1 spray into both nostrils daily.    ? tamsulosin (FLOMAX) 0.4 MG CAPS capsule Take 0.4 mg by mouth daily.    ? ?Facility-Administered Medications Prior to Visit  ?Medication Dose Route Frequency Provider Last Rate Last Admin  ? 0.9 %  sodium chloride infusion  500 mL Intravenous Once Irene Shipper, MD      ? ? ? ?Review of  Systems:  ? ?Constitutional: No weight loss or gain, night sweats, fevers, chills, fatigue, or lassitude. ?HEENT: No headaches, difficulty swallowing, tooth/dental problems, or sore throat. No sneezing,ear ache. +sinus pressure, nasal congestion, clear rhinorrhea, post nasal drip, itchy eyes, scratchy throat ?CV:  No chest pain, orthopnea, PND, swelling in lower extremities, anasarca, dizziness, palpitations, syncope ?Resp: +shortness of breath with exertion; improved cough (minimal production) No hemoptysis. No wheezing.  No chest wall deformity ?GI:  No heartburn, indigestion, abdominal pain, nausea, vomiting, diarrhea, change in bowel habits, loss of appetite, bloody stools.  ?GU: No dysuria, change in color of urine, urgency or frequency.  No flank pain, no hematuria  ?Skin: No rash, lesions, ulcerations ?MSK:  No joint pain or swelling.  No decreased range of motion.  No back pain. ?Neuro: No dizziness or lightheadedness.  ?Psych: No depression or anxiety. Mood stable.  ? ? ? ?Physical Exam: ? ?BP (!)  142/80   Pulse 66   Wt 267 lb (121.1 kg)   SpO2 95%   BMI 33.37 kg/m?  ? ?GEN: Pleasant, interactive, well-appearing; obese; in no acute distress. ?HEENT:  Normocephalic and atraumatic. EACs patent bilaterally.

## 2021-06-02 NOTE — Assessment & Plan Note (Signed)
Rate controlled.  Follow-up with cardiology. ?

## 2021-06-02 NOTE — Assessment & Plan Note (Signed)
Eos 0.8, relative 10.5% on most recent labs.  Maintained on ICS/LABA/LAMA therapy and started on Singulair.  Labs will need to be rechecked at next visit.  Referred to allergy for further evaluation given persistent symptoms. ?

## 2021-06-02 NOTE — Assessment & Plan Note (Signed)
Given minimal improvement with Singulair and Xyzal along with significant eosinophilia, will refer to allergist for further evaluation.  Continue nasal sprays, Xyzal and Singulair in interim. ?

## 2021-06-02 NOTE — Assessment & Plan Note (Signed)
Continues to experience high symptom burden.  Significant eosinophilia and allergic phenotype on previous labs.  Started on Singulair and Xyzal without significant improvement.  FeNO was normal today.  Did have some minimal evidence of bronchospasm on exam.  Will treat with prednisone taper today.  Continue triple therapy. ? ?Patient Instructions  ?-Continue Albuterol inhaler 2 puffs or 3 mL neb every 6 hours as needed for shortness of breath or wheezing. Notify if symptoms persist despite rescue inhaler/neb use. ?-Continue Breztri 2 puffs Twice daily. Brush tongue and rinse mouth well afterwards. Use with spacer.  ?-Continue flonase 2 spray each nostril daily ?-Continue saline nasal spray 1 spray each nostril 1-2 times a day  ?-Continue on supplemental oxygen 2 lpm at night ?-Continue Mucinex 600 mg Twice daily for chest congestion ?-Continue Astelin nasal spray 2 sprays each nostril Twice daily  ? ?Ensure you are taking ?-Xyzal (levocetirizine) 10 mg daily for allergies in the morning ?-Singulair (montelukast) 10 mg At bedtime  ? ?Prednisone taper. 4 tabs for 2 days, then 3 tabs for 2 days, 2 tabs for 2 days, then 1 tab for 2 days, then stop. Take in AM with food.  ? ?Referral to allergist today ?  ?Follow up in 4-6 weeks with Dr. [name]. If symptoms do not improve or worsen, please contact office for sooner follow up or seek emergency care. ? ? ?

## 2021-06-08 DIAGNOSIS — M25511 Pain in right shoulder: Secondary | ICD-10-CM | POA: Diagnosis not present

## 2021-06-08 DIAGNOSIS — M25611 Stiffness of right shoulder, not elsewhere classified: Secondary | ICD-10-CM | POA: Diagnosis not present

## 2021-06-14 DIAGNOSIS — M25611 Stiffness of right shoulder, not elsewhere classified: Secondary | ICD-10-CM | POA: Diagnosis not present

## 2021-06-14 DIAGNOSIS — M25511 Pain in right shoulder: Secondary | ICD-10-CM | POA: Diagnosis not present

## 2021-06-14 DIAGNOSIS — M542 Cervicalgia: Secondary | ICD-10-CM | POA: Diagnosis not present

## 2021-06-17 DIAGNOSIS — I10 Essential (primary) hypertension: Secondary | ICD-10-CM | POA: Diagnosis not present

## 2021-06-17 DIAGNOSIS — J449 Chronic obstructive pulmonary disease, unspecified: Secondary | ICD-10-CM | POA: Diagnosis not present

## 2021-06-17 DIAGNOSIS — N401 Enlarged prostate with lower urinary tract symptoms: Secondary | ICD-10-CM | POA: Diagnosis not present

## 2021-06-17 DIAGNOSIS — I700 Atherosclerosis of aorta: Secondary | ICD-10-CM | POA: Diagnosis not present

## 2021-06-21 ENCOUNTER — Encounter: Payer: Self-pay | Admitting: Internal Medicine

## 2021-06-21 ENCOUNTER — Ambulatory Visit: Payer: PPO | Admitting: Internal Medicine

## 2021-06-21 VITALS — BP 120/62 | HR 70 | Temp 98.6°F | Ht 75.0 in | Wt 263.2 lb

## 2021-06-21 DIAGNOSIS — R0981 Nasal congestion: Secondary | ICD-10-CM

## 2021-06-21 DIAGNOSIS — Z889 Allergy status to unspecified drugs, medicaments and biological substances status: Secondary | ICD-10-CM

## 2021-06-21 DIAGNOSIS — R0602 Shortness of breath: Secondary | ICD-10-CM

## 2021-06-21 DIAGNOSIS — Z836 Family history of other diseases of the respiratory system: Secondary | ICD-10-CM | POA: Diagnosis not present

## 2021-06-21 DIAGNOSIS — J479 Bronchiectasis, uncomplicated: Secondary | ICD-10-CM

## 2021-06-21 NOTE — Progress Notes (Signed)
? ? ? ?IOV 08/24/2017 ? ?Chief Complaint  ?Patient presents with  ? Consult  ?  Referred by Dr. Toney Hedge due to trouble coughing, coughing up mucus that is dark gray in color, and breathing has become worse. Pt also has c/o hoarseness that began 6 months ago. Pt's main complaint is labored breathing. Pt states he had a brother who died of pulmonary fibrosis.  ? ? ?Noah Huffman , 81 y.o. , with dob 1940/06/27 and male ,Not Hispanic or Latino from [place] ?[place] [address] - presents to pulm clinic for dyspnea and cough.  He is originally from [place] area and moved to [place] in the late 1960s and in [place] since the 1970s.His brother died at age 66 from pulmonary fibrosis lasted at the [facility] he is also worried about that.  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  He smoked heavily over 62 pack smoking history but quit over 25 years ago.  He tells me for the last 4 to 5 years he has had shortness of breath that is slowly progre ? ?ssive.  Definitely present with exertion relieved by rest.  Walking today in our office 185 feet x 3 laps on room air he did have some mild shortness of breath but did not desaturate.  There is no associated chest pain. ? ?  He does have a chronic cough that for which she was started on Symbicort few to several years ago and it did seem to help but in the last year or 2 the cough is deteriorated and associated with some yellow sputum production.  He does have some on and off hoarseness of voice that is constant for the last few years.  He recollects having had a ENT evaluation a few years ago that was normal.  He clears his throat.  There is excess feeling of postnasal drip there is some occasional choking episodes he finds the cough annoying and there is an occasional sensation of something sticking in his throat but there is no heartburn or dysphagia or coughing after lying down.  His RSI cough score  is 19.  He is noticed to be on losartan ? ?In 2017 he did have a plain old exercise treadmill test that was normal. ? ? ?FenO 08/24/2017 = 22 ppb on spiriva and symbicort.  Recently started on Symbicort in the last few months by Dr. Fredderick Phenix allergist.  Patient tells me that allergy testing was negative/normal ? ? ? ?In 2014 he had spirometry that shows both reduced FVC and FEV1 with a reduced ratio slightly favoring obstruction ? ?He had a CT chest aug 2018 that shows some bibasilar atelectasis this was a CT chest angiography.  In 2017 he had a CT abdomen pelvis that does not show these applications.  I personally visualized the CT chest and confirmed the findings. ? ? ? ?OV 10/08/2017 ? ?Chief Complaint  ?Patient presents with  ? Follow-up  ?  PFT done today, clears throat,   ? ?Follow-up cough and shortness of breath. He continues to be is on Spiriva and Symbicort. In the interim his symptoms of improvement. He only function test today and his FEV1 is 71% associated with moderate obstruction. DLCO is mildly reduced. Overall he feels great. We discussed pulmonary rehabilitation but he says he will exercise on his own and will think about attending rehabilitation center if x-ray does not help. He had high resolution CT chest  that I personally visualized and it shows bilateral lower lobe bronchiectasis Currently no sputum production. ? ?OV 01/01/2019 ? ?Subjective:  ?Patient ID: Noah Huffman, male , DOB: Jan 07, 1941 , age 79 y.o. , MRN: [id number] , ADDRESS: [address] ?[place] [address] ? ? ?01/01/2019 -   ?Chief Complaint  ?Patient presents with  ? Bronchiectasis without complication  ?  Patient said there are times when the breathing is worse, especially when hot. Recently had chest CT and was told to some to clinic to discuss.  ? ?Follow-up bilateral lower lobe bronchiectasis on Spiriva and Symbicort but with a family history of pulmonary fibrosis.  [His brother who is 5 years younger than him died in  07-Jun-2015 at the age of 74 from pulmonary fibrosis] ? ?HPI ?Noah Huffman 81 y.o. -presents for follow-up.  Last seen in July 2019.  Since then he has been on Spiriva and Symbicort.  He says the shortness of breath is stable but his chronic cough with sputum production has gotten worse a little bit especially early in the morning.  He is worried he might have pulmonary fibrosis because his brother died from it.  He also had a recent CT chest of the aorta that reported the bronchiectasis.  Therefore his allergist Dr. Fredderick Phenix is asked him to be reevaluated with [place].  Otherwise no new problems.  The only other health issue in the last 1 year is his left hip pain. ? ? ? ? ? ? ? ?OV 01/28/2019 ? ?Subjective:  ?Patient ID: Noah Huffman, male , DOB: [birth year] , age 33 y.o. , MRN: [id number] , ADDRESS: [address] ?[place] [address] ? ? ?01/28/2019 -   ?Chief Complaint  ?Patient presents with  ? Follow-up  ?  PFT and CT chest performed since last visit and pt is here today to go over the results. Pt states his cough is still the same since last visit.  ? ?Follow-up bilateral lower lobe bronchiectasis on Spiriva and Symbicort but with a family history of pulmonary fibrosis.  [His brother who is 5 years younger than him died in June 07, 2015 at the age of 7 from pulmonary fibrosis] ? ?HPI ?Noah Huffman 81 y.o. -returns for follow-up of her pulmonary function testing and high-resolution CT chest.  The high-resolution CT chest is again described as bronchiectasis in the bilateral lower lobes.  To me it looks a little bit like reticulation on my personal visualization and I wonder if there is early fibrosis.  Of note, somebody in radiology told him that he had pulmonary fibrosis and he got upset.  He is very scared about getting pulmonary fibrosis because his brother died from the same.  Regardless the level of radiologic abnormality appears to be very mild.  His pulmonary function test itself is stable compared to a year  ago. ? ?However his symptoms appear to be out of proportion to the pulmonary findings on the CT scan.  He tells me that his cough is severe RSI cough score is 27.  He clears his throat a lot.  He feels like there is lot of phlegm in the throat.  When I asked him if there is a sensation there is a "frog in the throat" he did replied in the affirmative.  He clears her throat a lot.  He did that even during the office visit.  But the cough is stable compared to a year ago ? ?His shortness of breath he states is worse.  Class  III on exertion relieved by rest.  He has significant visceral obesity.  He says his been tested for sleep apnea 3 times in the past but this has been negative.  His last cardiac stress test was 15-Jun-2015 a plain old treadmill test that I reviewed the result and was normal.  It has been several years since he had an echocardiogram.  He is willing to have another echocardiogram and if this is normal undergo pulmonary stress test. ? ?He is reporting new onset of hoarseness of voice for the last 1 year.  It seems to be intermittent.  He states his wife is also getting it.  He has previous history of smoking.  Has been on Symbicort for 7 years and Spiriva recently.  He has not seen ENT in several years or never seen them at all. ? ? ? ?CT chest high resolution ? ?IMPRESSION: ?1. No evidence of interstitial lung disease. ?2. Mild bibasilar bronchiectasis and scarring, as before. ?3. Aortic atherosclerosis (ICD10-170.0). Coronary artery ?calcification. ?  ?  ?Electronically Signed ?  By: Lorin Picket M.D. ?  On: 01/24/2019 16:29 ?  ?OV 04/09/2019 ? ?Subjective:  ?Patient ID: Noah Huffman, male , DOB: 01/25/1941 , age 45 y.o. , MRN: [id number] , ADDRESS: [address] ?[place] [address] ? ?Follow-up bilateral lower lobe bronchiectasis on Spiriva and Symbicort but with a family history of pulmonary fibrosis.  [His brother who is 5 years younger than him died in 15-Jun-2015 at the age of 22 from pulmonary  fibrosis] ? ? ?04/09/2019 -   ?Chief Complaint  ?Patient presents with  ? Follow-up  ?  f/u bronchiectasis, minimal cough with phlegm with cloudy mucus  ? ? ? ?HPI ?Noah Huffman 81 y.o. -presents for his f

## 2021-06-21 NOTE — Patient Instructions (Addendum)
Family history of pulmonary fibrosis ?Bronchiectasis without complication ([place]) ?Previous 60 pack smoking history ?Clinical COPD ?Sinus congestion and history of seasonal allergies ? ?- PFT dec 2022 might be slightly down since 2019 ?- Last CT - Nov 2021 ?- Noted sinus symptoms much better after Katie started several sinus/anti-histamine allergy meds ?- Noted that over time shortness of breath with exertion is slowly getting worse ? ?Plan ?- refer pulmonary rehab ? - do spirometry and dlco in 4 months ?- do HRCT supine and prie in 4 months ?- change allergy referral to DR Coral Hedge your former allergist ? - he can taper or change your allergy medicines ?-  ? ?-Continue Albuterol inhaler 2 puffs or 3 mL neb every 6 hours as needed  ?-Continue Breztri 2 puffs Twice daily.  ?- Brush tongue and rinse mouth well afterwards. Use with spacer.  ?-Continue flonase 2 spray each nostril daily ?-Continue saline nasal spray 1 spray each nostril 1-2 times a day  ?-Continue on supplemental oxygen 2 lpm at night ?-Continue Mucinex 600 mg Twice daily for chest congestion ?-Continue Astelin nasal spray 2 sprays each nostril Twice daily  ?- Continue Xyzal (levocetirizine) 10 mg daily for allergies in the morning ?-Continue Singulair (montelukast) 10 mg At bedtime  ?- ?  ?Follow up  ?in 4 monthswith  Dr. [name].  ?- If symptoms do not improve or worsen, please contact office for sooner follow up or seek emergency care. ? ?

## 2021-06-27 ENCOUNTER — Ambulatory Visit (INDEPENDENT_AMBULATORY_CARE_PROVIDER_SITE_OTHER): Payer: PPO

## 2021-06-27 DIAGNOSIS — I480 Paroxysmal atrial fibrillation: Secondary | ICD-10-CM

## 2021-06-28 LAB — CUP PACEART REMOTE DEVICE CHECK
Date Time Interrogation Session: 20230411113802
Implantable Pulse Generator Implant Date: 20211122

## 2021-07-07 ENCOUNTER — Encounter (HOSPITAL_COMMUNITY): Payer: Self-pay

## 2021-07-07 ENCOUNTER — Telehealth (HOSPITAL_COMMUNITY): Payer: Self-pay

## 2021-07-07 NOTE — Telephone Encounter (Signed)
Attempted to call patient in regards to Pulmonary Rehab - LM on VM Mailed letter 

## 2021-07-08 DIAGNOSIS — M25611 Stiffness of right shoulder, not elsewhere classified: Secondary | ICD-10-CM | POA: Diagnosis not present

## 2021-07-12 DIAGNOSIS — M25611 Stiffness of right shoulder, not elsewhere classified: Secondary | ICD-10-CM | POA: Diagnosis not present

## 2021-07-12 NOTE — Progress Notes (Signed)
[facility] Summary Report / Loop Recorder 

## 2021-07-13 ENCOUNTER — Telehealth (HOSPITAL_COMMUNITY): Payer: Self-pay

## 2021-07-13 NOTE — Telephone Encounter (Signed)
Pt returned PR phone call and stated he is interested in PR. Explained scheduling process, patient verbalized understanding.  ?

## 2021-07-14 DIAGNOSIS — M25611 Stiffness of right shoulder, not elsewhere classified: Secondary | ICD-10-CM | POA: Diagnosis not present

## 2021-07-19 DIAGNOSIS — M25611 Stiffness of right shoulder, not elsewhere classified: Secondary | ICD-10-CM | POA: Diagnosis not present

## 2021-07-21 DIAGNOSIS — M25511 Pain in right shoulder: Secondary | ICD-10-CM | POA: Diagnosis not present

## 2021-07-21 DIAGNOSIS — M5030 Other cervical disc degeneration, unspecified cervical region: Secondary | ICD-10-CM | POA: Diagnosis not present

## 2021-07-26 DIAGNOSIS — J3081 Allergic rhinitis due to animal (cat) (dog) hair and dander: Secondary | ICD-10-CM | POA: Diagnosis not present

## 2021-07-26 DIAGNOSIS — J301 Allergic rhinitis due to pollen: Secondary | ICD-10-CM | POA: Diagnosis not present

## 2021-07-26 DIAGNOSIS — J3089 Other allergic rhinitis: Secondary | ICD-10-CM | POA: Diagnosis not present

## 2021-07-26 DIAGNOSIS — R052 Subacute cough: Secondary | ICD-10-CM | POA: Diagnosis not present

## 2021-07-29 ENCOUNTER — Ambulatory Visit: Payer: PPO | Admitting: Internal Medicine

## 2021-08-01 ENCOUNTER — Ambulatory Visit (INDEPENDENT_AMBULATORY_CARE_PROVIDER_SITE_OTHER): Payer: PPO

## 2021-08-01 DIAGNOSIS — I480 Paroxysmal atrial fibrillation: Secondary | ICD-10-CM

## 2021-08-01 LAB — CUP PACEART REMOTE DEVICE CHECK
Date Time Interrogation Session: 20230511143128
Implantable Pulse Generator Implant Date: 20211122

## 2021-08-03 ENCOUNTER — Ambulatory Visit: Payer: Self-pay | Admitting: Internal Medicine

## 2021-08-04 ENCOUNTER — Other Ambulatory Visit: Payer: Self-pay | Admitting: Internal Medicine

## 2021-08-09 ENCOUNTER — Telehealth (HOSPITAL_COMMUNITY): Payer: Self-pay

## 2021-08-09 NOTE — Telephone Encounter (Signed)
Pt called in regards to PR, adv pt we do have a wait-list of 1-2 months. And will give him a call at a later date for scheduling.

## 2021-08-18 NOTE — Progress Notes (Unsigned)
Cardiology Office Note Date:  08/19/2021  Patient ID:  Noah Huffman, Noah Huffman [birth year], MRN [id number] PCP:  [name], MD  Electrophysiologist: Dr. Quentin Ore    Chief Complaint: 6 mo   History of Present Illness: Noah Huffman is a 81 y.o. male with history of HTN, HLD, OSA (no longer w/CPAP, reports the last test was improved), asthma, AFlutter ablated > loop > Afib.  He comes today to be seen for Dr. Quentin Ore.  Last seen by him Dec 2022, at that time had only a day with a few episodes of AF none longer then a hour.  The patient preferred to stop his eliquis with plans to resume if burden increased.  TODAY He is doing well Has COPD with some baseline SOB that remains at his baseline. No CP He has not had any cardiac awareness, says he had never really been aware of his arrhythmia though. No near syncope or syncope.   Device information MDT LNQII implanted 02/09/2020   Past Medical History:  Diagnosis Date   Allergy    seasonal   Asthma    Atrial fibrillation (HCC)    BPH (benign prostatic hypertrophy) with urinary obstruction    Cataract    removed both eyes   COPD (chronic obstructive pulmonary disease) (HCC)    Diverticulosis    Dysrhythmia    GERD (gastroesophageal reflux disease)    Hyperglycemia    Hyperlipidemia    Hypertension    Inguinal hernia    Neuromuscular disorder ([place])    neuropathy in feet    Obesity    OSA (obstructive sleep apnea) 06/06/2013   Osteoarthritis    Sleep apnea    no cpap    Past Surgical History:  Procedure Laterality Date   A-FLUTTER ABLATION N/A 01/02/2020   Procedure: A-FLUTTER ABLATION;  Surgeon: Vickie Epley, MD;  Location: [facility];  Service: Cardiovascular;  Laterality: N/A;   APPENDECTOMY     arthroscopic knee surgery     CATARACT EXTRACTION  2012   bilateral   COLONOSCOPY     HAMMER TOE SURGERY     INGUINAL HERNIA REPAIR     right   POLYPECTOMY     REVERSE SHOULDER ARTHROPLASTY Right 03/26/2020    Procedure: REVERSE SHOULDER ARTHROPLASTY;  Surgeon: Netta Cedars, MD;  Location: [facility];  Service: Orthopedics;  Laterality: Right;  interscalane block   TONSILLECTOMY AND ADENOIDECTOMY     TOTAL HIP ARTHROPLASTY  2007   right    Current Outpatient Medications  Medication Sig Dispense Refill   albuterol (VENTOLIN HFA) 108 (90 Base) MCG/ACT inhaler Inhale 2 puffs into the lungs every 6 (six) hours as needed for wheezing or shortness of breath. 8 g 6   aspirin EC 81 MG tablet Take 1 tablet (81 mg total) by mouth daily. Swallow whole. 90 tablet 3   azelastine (ASTELIN) 0.1 % nasal spray Place 2 sprays into both nostrils 2 (two) times daily. Use in each nostril as directed 30 mL 3   benzonatate (TESSALON) 200 MG capsule Take 1 capsule (200 mg total) by mouth 3 (three) times daily as needed for cough. 30 capsule 0   BREZTRI AEROSPHERE 160-9-4.8 MCG/ACT AERO INHALE 2 PUFFS BY MOUTH INTO THE LUNGS IN THE MORNING AND AT BEDTIME 10.7 g 5   fluticasone (FLONASE) 50 MCG/ACT nasal spray Place 1 spray into both nostrils daily.     gabapentin (NEURONTIN) 600 MG tablet Take 1,800 mg by mouth 3 (three) times daily.  guaiFENesin (MUCINEX) 600 MG 12 hr tablet Take 1 tablet (600 mg total) by mouth 2 (two) times daily. 60 tablet 3   levocetirizine (XYZAL) 5 MG tablet Take 1 tablet (5 mg total) by mouth every evening. 90 tablet 1   losartan-hydrochlorothiazide (HYZAAR) 100-25 MG tablet Take 1 tablet by mouth daily.     montelukast (SINGULAIR) 10 MG tablet Take 1 tablet (10 mg total) by mouth at bedtime. 30 tablet 5   Multiple Vitamins-Minerals (PRESERVISION AREDS 2) CAPS Take 1 capsule by mouth daily.      polyvinyl alcohol (LIQUIFILM TEARS) 1.4 % ophthalmic solution Place 1 drop into both eyes 2 (two) times daily.     rosuvastatin (CRESTOR) 10 MG tablet Take 10 mg by mouth at bedtime.     sodium chloride (OCEAN) 0.65 % SOLN nasal spray Place 1 spray into both nostrils daily.     tamsulosin (FLOMAX) 0.4 MG  CAPS capsule Take 0.4 mg by mouth daily.     Current Facility-Administered Medications  Medication Dose Route Frequency Provider Last Rate Last Admin   0.9 %  sodium chloride infusion  500 mL Intravenous Once Irene Shipper, MD        Allergies:   Macrobid [nitrofurantoin] and Morphine and related   Social History:  The patient  reports that he quit smoking about 33 years ago. His smoking use included cigarettes. He has a 62.50 pack-year smoking history. He has never used smokeless tobacco. He reports current alcohol use of about 1.0 standard drink per week. He reports that he does not use drugs.   Family History:  The patient's family history includes Brain cancer in his brother; Colon cancer (age of onset: 46) in his mother; Leukemia in his father; Pneumonia in his father; Pulmonary fibrosis in his brother; Transient ischemic attack in his father.  ROS:  Please see the history of present illness.    All other systems are reviewed and otherwise negative.   PHYSICAL EXAM:  VS:  BP 132/64   Pulse 66   Ht '6\' 4"'$  (1.93 m)   Wt 270 lb (122.5 kg)   SpO2 96%   BMI 32.87 kg/m  BMI: Body mass index is 32.87 kg/m. Well nourished, well developed, in no acute distress HEENT: normocephalic, atraumatic Neck: no JVD, carotid bruits or masses Cardiac:   RRR; no significant murmurs, no rubs, or gallops Lungs:   CTA b/l, no wheezing, rhonchi or rales Abd: soft, nontender, obese MS: no deformity or atrophy Ext: no edema Skin: warm and dry, no rash Neuro:  No gross deficits appreciated Psych: euthymic mood, full affect  ILR site is stable, no tethering or discomfort   EKG:  not done today  Device interrogation done today and reviewed by myself:  Battery is good R waves 0.17m SR today 29 total and lifetime episodes EGMs are reviewed, some artifact though most appear to be SR, PACs   01/02/2020: EPS/Ablation CONCLUSIONS:  1.  Typical atrial flutter upon presentation.  2 successful  ablation of the cavotricuspid isthmus 3. No inducible arrhythmias following ablation.  4. No early apparent complications.   02/11/2019: TTE 1. Left ventricular ejection fraction, by visual estimation, is 55 to  60%. The left ventricle has normal function. There is mildly increased  left ventricular hypertrophy.   2. Left ventricular diastolic parameters are consistent with Grade I  diastolic dysfunction (impaired relaxation).   3. The left ventricle has no regional wall motion abnormalities.   4. Global right ventricle has normal  systolic function.The right  ventricular size is normal. No increase in right ventricular wall  thickness.   5. Left atrial size was normal.   6. Right atrial size was normal.   7. The mitral valve is normal in structure. No evidence of mitral valve  regurgitation.   8. The tricuspid valve is normal in structure. Tricuspid valve  regurgitation is not demonstrated.   9. The aortic valve is tricuspid. Aortic valve regurgitation is not  visualized. No evidence of aortic valve sclerosis or stenosis.  10. 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  11. The inferior vena cava is normal in size with greater than 50%  respiratory variability, suggesting right atrial pressure of 3 mmHg.  12. TR signal is inadequate for assessing pulmonary artery systolic  pressure.    07/22/2015; EST There was no ST segment deviation noted during stress. No T wave inversion was noted during stress.   Clinically and electrically negative for ischemia     Recent Labs: 05/19/2021: ALT 10; BUN 18; Creatinine, Ser 0.90; Hemoglobin 15.1; Platelets 218.0; Potassium 3.7; Sodium 140  No results found for requested labs within last 8760 hours.   CrCl cannot be calculated (Patient's most recent lab result is older than the maximum 21 days allowed.).   Wt Readings from Last 3 Encounters:  08/19/21 270 lb (122.5 kg)  06/21/21 263 lb 3.2 oz (119.4 kg)  06/02/21 267 lb  (121.1 kg)     Other studies reviewed: Additional studies/records reviewed today include: summarized above  ASSESSMENT AND PLAN:  AFlutter Paroxysmal Afib CHA2DS2Vasc is 3, off a/c post CTI ablation Monitoringing AFib burden for a/c decisions going forward <0.1 % burden, though EGMs reviewed are false  HTN Looks OK, no changes  Disposition: F/u with monthly remotes, in clinic in 35mo sooner if needed  Current medicines are reviewed at length with the patient today.  The patient did not have any concerns regarding medicines.  SVenetia Night PA-C 08/19/2021 12:25 PM     [place] [phone](office)  [phone](fax)

## 2021-08-19 ENCOUNTER — Encounter: Payer: Self-pay | Admitting: Physician Assistant

## 2021-08-19 ENCOUNTER — Ambulatory Visit: Payer: PPO | Admitting: Physician Assistant

## 2021-08-19 VITALS — BP 132/64 | HR 66 | Ht 76.0 in | Wt 270.0 lb

## 2021-08-19 DIAGNOSIS — I480 Paroxysmal atrial fibrillation: Secondary | ICD-10-CM | POA: Diagnosis not present

## 2021-08-19 DIAGNOSIS — Z4509 Encounter for adjustment and management of other cardiac device: Secondary | ICD-10-CM | POA: Diagnosis not present

## 2021-08-19 DIAGNOSIS — I10 Essential (primary) hypertension: Secondary | ICD-10-CM | POA: Diagnosis not present

## 2021-08-19 NOTE — Patient Instructions (Addendum)
Medication Instructions:    Your physician recommends that you continue on your current medications as directed. Please refer to the Current Medication list given to you today.   *If you need a refill on your cardiac medications before your next appointment, please call your pharmacy*   Lab Work: [place]   If you have labs (blood work) drawn today and your tests are completely normal, you will receive your results only by: [place] (if you have MyChart) OR A paper copy in the mail If you have any lab test that is abnormal or we need to change your treatment, we will call you to review the results.   Testing/Procedures: NONE ORDERED  TODAY     Follow-Up: At [facility], you and your health needs are our priority.  As part of our continuing mission to provide you with exceptional heart care, we have created designated Provider Care Teams.  These Care Teams include your primary Cardiologist (physician) and Advanced Practice Providers (APPs -  Physician Assistants and Nurse Practitioners) who all work together to provide you with the care you need, when you need it.  We recommend signing up for the patient portal called "MyChart".  Sign up information is provided on this After Visit Summary.  MyChart is used to connect with patients for Virtual Visits (Telemedicine).  Patients are able to view lab/test results, encounter notes, upcoming appointments, etc.  Non-urgent messages can be sent to your provider as well.   To learn more about what you can do with MyChart, go to [email or web address].    Your next appointment:   6 month(s)  The format for your next appointment:   In Person  Provider:   You may see Vickie Epley, MD or one of the following Advanced Practice Providers on your designated Care Team:   Tommye Standard, [place] Legrand Como "Jonni Sanger" Chalmers Cater, PA-C{    Other Instructions   Important Information About Sugar

## 2021-08-19 NOTE — Progress Notes (Signed)
[facility] Summary Report / Loop Recorder 

## 2021-09-05 ENCOUNTER — Ambulatory Visit (INDEPENDENT_AMBULATORY_CARE_PROVIDER_SITE_OTHER): Payer: PPO

## 2021-09-05 DIAGNOSIS — I482 Chronic atrial fibrillation, unspecified: Secondary | ICD-10-CM | POA: Diagnosis not present

## 2021-09-06 LAB — CUP PACEART REMOTE DEVICE CHECK
Date Time Interrogation Session: 20230612230611
Implantable Pulse Generator Implant Date: 20211122

## 2021-09-14 ENCOUNTER — Telehealth (HOSPITAL_COMMUNITY): Payer: Self-pay

## 2021-09-14 NOTE — Telephone Encounter (Signed)
Pt insurance is active and benefits verified through [employer]. Co-pay $15, DED 0/0 met, out of pocket $3,200/$260 met, co-insurance 0%. no pre-authorization required, Angela/[employer] 09/14/2021_0 :40am, REF# [id number]

## 2021-09-26 ENCOUNTER — Inpatient Hospital Stay (HOSPITAL_COMMUNITY)
Admission: RE | Admit: 2021-09-26 | Discharge: 2021-09-26 | Disposition: A | Discharge status: Home / Self Care | Payer: PPO | Source: Ambulatory Visit

## 2021-09-26 NOTE — Progress Notes (Signed)
Pt in schedule for orientation in Pulmonary Rehab today however did not show. Called pt x2 and when he returned call, pt sts he is out of town and did not receive his orientation packet and schedule date. Told pt that he will be rescheduled and he is in agreement.  Yves Dill CES, ACSM 09/26/2021 11:18 AM

## 2021-09-27 NOTE — Progress Notes (Signed)
[facility] Summary Report / Loop Recorder 

## 2021-10-04 ENCOUNTER — Ambulatory Visit (HOSPITAL_COMMUNITY): Payer: PPO

## 2021-10-06 ENCOUNTER — Ambulatory Visit (HOSPITAL_COMMUNITY): Payer: PPO

## 2021-10-10 ENCOUNTER — Ambulatory Visit (INDEPENDENT_AMBULATORY_CARE_PROVIDER_SITE_OTHER): Payer: PPO

## 2021-10-10 DIAGNOSIS — I482 Chronic atrial fibrillation, unspecified: Secondary | ICD-10-CM | POA: Diagnosis not present

## 2021-10-10 LAB — CUP PACEART REMOTE DEVICE CHECK
Date Time Interrogation Session: 20230715230137
Implantable Pulse Generator Implant Date: 20211122

## 2021-10-11 ENCOUNTER — Ambulatory Visit (HOSPITAL_COMMUNITY): Payer: PPO

## 2021-10-13 ENCOUNTER — Telehealth (HOSPITAL_COMMUNITY): Payer: Self-pay

## 2021-10-13 ENCOUNTER — Ambulatory Visit (HOSPITAL_COMMUNITY): Payer: PPO

## 2021-10-13 NOTE — Telephone Encounter (Signed)
Called patient to see if he was interested in participating in the Pulmonary Rehab Program. Patient stated yes. Patient will come in for orientation on 10/14/2021'@1'$ :00pm and will attend the 10:15am exercise class.

## 2021-10-14 ENCOUNTER — Encounter (HOSPITAL_COMMUNITY)
Admission: RE | Admit: 2021-10-14 | Discharge: 2021-10-14 | Disposition: A | Discharge status: Home / Self Care | Payer: PPO | Source: Ambulatory Visit | Attending: Internal Medicine | Admitting: Internal Medicine

## 2021-10-14 ENCOUNTER — Encounter (HOSPITAL_COMMUNITY): Payer: Self-pay

## 2021-10-14 VITALS — BP 120/60 | HR 74 | Ht 76.0 in | Wt 273.4 lb

## 2021-10-14 DIAGNOSIS — J479 Bronchiectasis, uncomplicated: Secondary | ICD-10-CM | POA: Insufficient documentation

## 2021-10-14 DIAGNOSIS — Z5189 Encounter for other specified aftercare: Secondary | ICD-10-CM | POA: Insufficient documentation

## 2021-10-14 NOTE — Progress Notes (Signed)
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 Swallow whole., Disp: 90 tablet, Rfl: 3   azelastine (ASTELIN) 0.1 % nasal spray, Place 2 sprays into both nostrils 2 (two) times daily. Use in each nostril as directed, Disp: 30 mL, Rfl: 3   benzonatate (TESSALON) 200 MG capsule, Take 1 capsule (200 mg total) by mouth 3 (three) times daily as needed for cough., Disp: 30 capsule, Rfl: 0   BREZTRI AEROSPHERE 160-9-4.8 MCG/ACT AERO, INHALE 2 PUFFS BY MOUTH INTO THE LUNGS IN THE MORNING AND AT BEDTIME, Disp: 10.7 g, Rfl: 5   fluticasone (FLONASE) 50 MCG/ACT nasal spray, Place 1 spray into both nostrils daily., Disp: , Rfl:    gabapentin (NEURONTIN) 600 MG tablet, Take 1,800 mg by mouth 3 (three) times daily., Disp: , Rfl:    guaiFENesin (MUCINEX) 600 MG 12 hr tablet, Take 1 tablet (600 mg total) by mouth 2 (two) times daily., Disp: 60 tablet, Rfl: 3   levocetirizine (XYZAL) 5 MG tablet, Take 1 tablet (5 mg total) by mouth every evening., Disp: 90 tablet, Rfl: 1   losartan-hydrochlorothiazide (HYZAAR) 100-25 MG tablet, Take 1 tablet by mouth daily., Disp: , Rfl:    montelukast (SINGULAIR) 10 MG tablet, Take 1 tablet (10 mg total) by mouth at  bedtime., Disp: 30 tablet, Rfl: 5   Multiple Vitamins-Minerals (PRESERVISION AREDS 2) CAPS, Take 1 capsule by mouth daily. , Disp: , Rfl:    polyvinyl alcohol (LIQUIFILM TEARS) 1.4 % ophthalmic solution, Place 1 drop into both eyes 2 (two) times daily., Disp: , Rfl:    rosuvastatin (CRESTOR) 10 MG tablet, Take 10 mg by mouth at bedtime., Disp: , Rfl:    sodium chloride (OCEAN) 0.65 % SOLN nasal spray, Place 1 spray into both nostrils daily., Disp: , Rfl:    tamsulosin (FLOMAX) 0.4 MG CAPS capsule, Take 0.4 mg by mouth daily., Disp: , Rfl:   Current Facility-Administered Medications:    0.9 %  sodium chloride infusion, 500 mL, Intravenous, Once, Irene Shipper, MD  Past Medical History: Past Medical History:  Diagnosis Date   Allergy    seasonal   Asthma    Atrial fibrillation ([place])    BPH (benign prostatic hypertrophy) with urinary obstruction    Cataract    removed both eyes   COPD (chronic obstructive pulmonary disease) (HCC)    Diverticulosis    Dysrhythmia    GERD (gastroesophageal reflux disease)    Hyperglycemia    Hyperlipidemia    Hypertension    Inguinal hernia    Neuromuscular disorder (HCC)    neuropathy in feet    Obesity    OSA (obstructive sleep apnea) 06/06/2013   Osteoarthritis    Sleep apnea  no cpap    Tobacco Use: Social History   Tobacco Use  Smoking Status Former   Packs/day: 2.50   Years: 25.00   Total pack years: 62.50   Types: Cigarettes   Quit date: 03/20/1988   Years since quitting: 33.5  Smokeless Tobacco Never    Labs: Review Flowsheet        No data to display          Capillary Blood Glucose: No results found for: "GLUCAP"   Pulmonary Assessment Scores:  Pulmonary Assessment Scores     Row Name 10/14/21 1525         ADL UCSD   ADL Phase Entry     SOB Score total 39       CAT Score   CAT Score 16       mMRC Score   mMRC Score 1             UCSD: Self-administered rating of dyspnea associated with  activities of daily living (ADLs) 6-point scale (0 = "not at all" to 5 = "maximal or unable to do because of breathlessness")  Scoring Scores range from 0 to 120.  Minimally important difference is 5 units  CAT: CAT can identify the health impairment of COPD patients and is better correlated with disease progression.  CAT has a scoring range of zero to 40. The CAT score is classified into four groups of low (less than 10), medium (10 - 20), high (21-30) and very high (31-40) based on the impact level of disease on health status. A CAT score over 10 suggests significant symptoms.  A worsening CAT score could be explained by an exacerbation, poor medication adherence, poor inhaler technique, or progression of COPD or comorbid conditions.  CAT MCID is 2 points  mMRC: mMRC (Modified Medical Research Council) Dyspnea Scale is used to assess the degree of baseline functional disability in patients of respiratory disease due to dyspnea. No minimal important difference is established. A decrease in score of 1 point or greater is considered a positive change.   Pulmonary Function Assessment:  Pulmonary Function Assessment - 10/14/21 1336       Breath   Bilateral Breath Sounds Decreased    Shortness of Breath Yes;Limiting activity;Fear of Shortness of Breath             Exercise Target Goals: Exercise Program Goal: Individual exercise prescription set using results from initial 6 min walk test and THRR while considering  patient's activity barriers and safety.   Exercise Prescription Goal: Initial exercise prescription builds to 30-45 minutes a day of aerobic activity, 2-3 days per week.  Home exercise guidelines will be given to patient during program as part of exercise prescription that the participant will acknowledge.  Activity Barriers & Risk Stratification:  Activity Barriers & Cardiac Risk Stratification - 10/14/21 1415       Activity Barriers & Cardiac Risk Stratification    Activity Barriers Deconditioning;Muscular Weakness;Shortness of Breath;History of Falls   neuropathy in feet            6 Minute Walk:  6 Minute Walk     Row Name 10/14/21 1521         6 Minute Walk   Phase Initial     Distance 1227 feet     Walk Time 6 minutes     # of Rest Breaks 0     MPH 2.32     METS 1.85     RPE 13  Perceived Dyspnea  1     VO2 Peak 6.47     Symptoms No     Resting HR 64 bpm     Resting BP 122/60     Resting Oxygen Saturation  97 %     Exercise Oxygen Saturation  during 6 min walk 93 %     Max Ex. HR 79 bpm     Max Ex. BP 152/68     2 Minute Post BP 134/60       Interval HR   1 Minute HR 71     2 Minute HR 73     3 Minute HR 73     4 Minute HR 73     5 Minute HR 79     6 Minute HR 79     2 Minute Post HR 66     Interval Heart Rate? Yes       Interval Oxygen   Interval Oxygen? Yes     Baseline Oxygen Saturation % 97 %     1 Minute Oxygen Saturation % 91 %     1 Minute Liters of Oxygen 0 L     2 Minute Oxygen Saturation % 96 %     2 Minute Liters of Oxygen 0 L     3 Minute Oxygen Saturation % 94 %     3 Minute Liters of Oxygen 0 L     4 Minute Oxygen Saturation % 99 %     4 Minute Liters of Oxygen 0 L     5 Minute Oxygen Saturation % 95 %     5 Minute Liters of Oxygen 0 L     6 Minute Oxygen Saturation % 93 %     6 Minute Liters of Oxygen 0 L     2 Minute Post Oxygen Saturation % 95 %     2 Minute Post Liters of Oxygen 0 L              Oxygen Initial Assessment:  Oxygen Initial Assessment - 10/14/21 1418       Home Oxygen   Home Oxygen Device Home Concentrator    Sleep Oxygen Prescription Continuous    Liters per minute 2    Home Exercise Oxygen Prescription None    Home Resting Oxygen Prescription None    Compliance with Home Oxygen Use Yes      Initial 6 min Walk   Oxygen Used None      Program Oxygen Prescription   Program Oxygen Prescription None      Intervention   Short Term Goals To learn and exhibit  compliance with exercise, home and travel O2 prescription;To learn and understand importance of monitoring SPO2 with pulse oximeter and demonstrate accurate use of the pulse oximeter.;To learn and understand importance of maintaining oxygen saturations>88%;To learn and demonstrate proper pursed lip breathing techniques or other breathing techniques. ;To learn and demonstrate proper use of respiratory medications    Long  Term Goals Exhibits compliance with exercise, home  and travel O2 prescription;Verbalizes importance of monitoring SPO2 with pulse oximeter and return demonstration;Maintenance of O2 saturations>88%;Exhibits proper breathing techniques, such as pursed lip breathing or other method taught during program session;Compliance with respiratory medication;Demonstrates proper use of MDI's             Oxygen Re-Evaluation:   Oxygen Discharge (Final Oxygen Re-Evaluation):   Initial Exercise Prescription:  Initial Exercise Prescription - 10/14/21 1500       Date  of Initial Exercise RX and Referring Provider   Date 10/14/21    Referring Provider [name]    Expected Discharge Date 12/22/21      NuStep   Level 1    SPM 70    Minutes 15      Arm Ergometer   Level 1    Watts 5    RPM 40    Minutes 15      Prescription Details   Frequency (times per week) 2    Duration Progress to 30 minutes of continuous aerobic without signs/symptoms of physical distress      Intensity   THRR 40-80% of Max Heartrate 56-112    Ratings of Perceived Exertion 11-13    Perceived Dyspnea 0-4      Progression   Progression Continue to progress workloads to maintain intensity without signs/symptoms of physical distress.      Resistance Training   Training Prescription Yes    Weight blue bands    Reps 10-15             Perform Capillary Blood Glucose checks as needed.  Exercise Prescription Changes:   Exercise Comments:   Exercise Goals and Review:   Exercise Goals      Row Name 10/14/21 1417             Exercise Goals   Increase Physical Activity Yes       Intervention Provide advice, education, support and counseling about physical activity/exercise needs.       Expected Outcomes Long Term: Add in home exercise to make exercise part of routine and to increase amount of physical activity.;Short Term: Attend rehab on a regular basis to increase amount of physical activity.;Long Term: Exercising regularly at least 3-5 days a week.       Increase Strength and Stamina Yes       Intervention Provide advice, education, support and counseling about physical activity/exercise needs.;Develop an individualized exercise prescription for aerobic and resistive training based on initial evaluation findings, risk stratification, comorbidities and participant's personal goals.       Expected Outcomes Short Term: Increase workloads from initial exercise prescription for resistance, speed, and METs.;Short Term: Perform resistance training exercises routinely during rehab and add in resistance training at home;Long Term: Improve cardiorespiratory fitness, muscular endurance and strength as measured by increased METs and functional capacity (6MWT)       Able to understand and use rate of perceived exertion (RPE) scale Yes       Intervention Provide education and explanation on how to use RPE scale       Expected Outcomes Short Term: Able to use RPE daily in rehab to express subjective intensity level;Long Term:  Able to use RPE to guide intensity level when exercising independently       Able to understand and use Dyspnea scale Yes       Expected Outcomes Long Term: Able to use Dyspnea scale to guide intensity level when exercising independently       Knowledge and understanding of Target Heart Rate Range (THRR) Yes       Intervention Provide education and explanation of THRR including how the numbers were predicted and where they are located for reference       Expected Outcomes  Short Term: Able to state/look up THRR;Long Term: Able to use THRR to govern intensity when exercising independently;Short Term: Able to use daily as guideline for intensity in rehab       Understanding of  Exercise Prescription Yes       Intervention Provide education, explanation, and written materials on patient's individual exercise prescription       Expected Outcomes Short Term: Able to explain program exercise prescription;Long Term: Able to explain home exercise prescription to exercise independently                Exercise Goals Re-Evaluation :   Discharge Exercise Prescription (Final Exercise Prescription Changes):   Nutrition:  Target Goals: Understanding of nutrition guidelines, daily intake of sodium '1500mg'$ , cholesterol '200mg'$ , calories 30% from fat and 7% or less from saturated fats, daily to have 5 or more servings of fruits and vegetables.  Biometrics:  Pre Biometrics - 10/14/21 1336       Pre Biometrics   Grip Strength 30 kg              Nutrition Therapy Plan and Nutrition Goals:   Nutrition Assessments:  MEDIFICTS Score Key: ?70 Need to make dietary changes  40-70 Heart Healthy Diet ? 40 Therapeutic Level Cholesterol Diet   Picture Your Plate Scores: <94 Unhealthy dietary pattern with much room for improvement. 41-50 Dietary pattern unlikely to meet recommendations for good health and room for improvement. 51-60 More healthful dietary pattern, with some room for improvement.  >60 Healthy dietary pattern, although there may be some specific behaviors that could be improved.    Nutrition Goals Re-Evaluation:   Nutrition Goals Discharge (Final Nutrition Goals Re-Evaluation):   Psychosocial: Target Goals: Acknowledge presence or absence of significant depression and/or stress, maximize coping skills, provide positive support system. Participant is able to verbalize types and ability to use techniques and skills needed for reducing stress and  depression.  Initial Review & Psychosocial Screening:  Initial Psych Review & Screening - 10/14/21 1406       Initial Review   Current issues with None Identified      Family Dynamics   Good Support System? Yes    Comments spouse, children, and grandchildren      Barriers   Psychosocial barriers to participate in program There are no identifiable barriers or psychosocial needs.      Screening Interventions   Interventions Encouraged to exercise             Quality of Life Scores:  Scores of 19 and below usually indicate a poorer quality of life in these areas.  A difference of  2-3 points is a clinically meaningful difference.  A difference of 2-3 points in the total score of the Quality of Life Index has been associated with significant improvement in overall quality of life, self-image, physical symptoms, and general health in studies assessing change in quality of life.  PHQ-9: Review Flowsheet       10/14/2021  Depression screen PHQ 2/9  Decreased Interest 0  Down, Depressed, Hopeless 0  PHQ - 2 Score 0  Altered sleeping 0  Tired, decreased energy 0  Change in appetite 0  Feeling bad or failure about yourself  0  Trouble concentrating 0  Moving slowly or fidgety/restless 0  Suicidal thoughts 0  PHQ-9 Score 0   Interpretation of Total Score  Total Score Depression Severity:  1-4 = Minimal depression, 5-9 = Mild depression, 10-14 = Moderate depression, 15-19 = Moderately severe depression, 20-27 = Severe depression   Psychosocial Evaluation and Intervention:  Psychosocial Evaluation - 10/14/21 1410       Psychosocial Evaluation & Interventions   Interventions Encouraged to exercise with the program and follow  exercise prescription    Expected Outcomes For Barbarann Ehlers to participate in Pulmonary Rehab without psychosocial concerns.    Continue Psychosocial Services  Follow up required by staff             Psychosocial Re-Evaluation:   Psychosocial  Discharge (Final Psychosocial Re-Evaluation):   Education: Education Goals: Education classes will be provided on a weekly basis, covering required topics. Participant will state understanding/return demonstration of topics presented.  Learning Barriers/Preferences:  Learning Barriers/Preferences - 10/14/21 1410       Learning Barriers/Preferences   Learning Barriers Sight   wears glasses   Learning Preferences Verbal Instruction;Written Material;Pictoral;Skilled Demonstration             Education Topics: Risk Factor Reduction:  -Group instruction that is supported by a PowerPoint presentation. Instructor discusses the definition of a risk factor, different risk factors for pulmonary disease, and how the heart and lungs work together.     Nutrition for Pulmonary Patient:  -Group instruction provided by PowerPoint slides, verbal discussion, and written materials to support subject matter. The instructor gives an explanation and review of healthy diet recommendations, which includes a discussion on weight management, recommendations for fruit and vegetable consumption, as well as protein, fluid, caffeine, fiber, sodium, sugar, and alcohol. Tips for eating when patients are short of breath are discussed.   Pursed Lip Breathing:  -Group instruction that is supported by demonstration and informational handouts. Instructor discusses the benefits of pursed lip and diaphragmatic breathing and detailed demonstration on how to preform both.     Oxygen Safety:  -Group instruction provided by PowerPoint, verbal discussion, and written material to support subject matter. There is an overview of "What is Oxygen" and "Why do we need it".  Instructor also reviews how to create a safe environment for oxygen use, the importance of using oxygen as prescribed, and the risks of noncompliance. There is a brief discussion on traveling with oxygen and resources the patient may utilize.   Oxygen  Equipment:  -Group instruction provided by [facility] Staff utilizing handouts, written materials, and equipment demonstrations.   Signs and Symptoms:  -Group instruction provided by written material and verbal discussion to support subject matter. Warning signs and symptoms of infection, stroke, and heart attack are reviewed and when to call the physician/911 reinforced. Tips for preventing the spread of infection discussed.   Advanced Directives:  -Group instruction provided by verbal instruction and written material to support subject matter. Instructor reviews Advanced Directive laws and proper instruction for filling out document.   Pulmonary Video:  -Group video education that reviews the importance of medication and oxygen compliance, exercise, good nutrition, pulmonary hygiene, and pursed lip and diaphragmatic breathing for the pulmonary patient.   Exercise for the Pulmonary Patient:  -Group instruction that is supported by a PowerPoint presentation. Instructor discusses benefits of exercise, core components of exercise, frequency, duration, and intensity of an exercise routine, importance of utilizing pulse oximetry during exercise, safety while exercising, and options of places to exercise outside of rehab.     Pulmonary Medications:  -Verbally interactive group education provided by instructor with focus on inhaled medications and proper administration.   Anatomy and Physiology of the Respiratory System and Intimacy:  -Group instruction provided by PowerPoint, verbal discussion, and written material to support subject matter. Instructor reviews respiratory cycle and anatomical components of the respiratory system and their functions. Instructor also reviews differences in obstructive and restrictive respiratory diseases with examples of each. Intimacy, Sex, and Sexuality differences  are reviewed with a discussion on how relationships can change when diagnosed with pulmonary  disease. Common sexual concerns are reviewed.   MD DAY -A group question and answer session with a medical doctor that allows participants to ask questions that relate to their pulmonary disease state.   OTHER EDUCATION -Group or individual verbal, written, or video instructions that support the educational goals of the pulmonary rehab program.   Holiday Eating Survival Tips:  -Group instruction provided by PowerPoint slides, verbal discussion, and written materials to support subject matter. The instructor gives patients tips, tricks, and techniques to help them not only survive but enjoy the holidays despite the onslaught of food that accompanies the holidays.   Knowledge Questionnaire Score:  Knowledge Questionnaire Score - 10/14/21 1525       Knowledge Questionnaire Score   Pre Score 15/18             Core Components/Risk Factors/Patient Goals at Admission:  Personal Goals and Risk Factors at Admission - 10/14/21 1411       Core Components/Risk Factors/Patient Goals on Admission    Weight Management Weight Loss    Improve shortness of breath with ADL's Yes    Intervention Provide education, individualized exercise plan and daily activity instruction to help decrease symptoms of SOB with activities of daily living.    Expected Outcomes Short Term: Improve cardiorespiratory fitness to achieve a reduction of symptoms when performing ADLs;Long Term: Be able to perform more ADLs without symptoms or delay the onset of symptoms             Core Components/Risk Factors/Patient Goals Review:    Core Components/Risk Factors/Patient Goals at Discharge (Final Review):    ITP Comments: Dr. Rodman Pickle is Medical Director for Pulmonary Rehab at [facility].

## 2021-10-14 NOTE — Progress Notes (Signed)
Pulmonary Rehab Orientation Physical Assessment Note  Physical assessment reveals  Pt is alert and oriented x 3.  Heart rate is normal, breath sounds diminished and coarse throughout. Reports productive cough. Bowel sounds present.  Pt endorses abdominal discomfort, nausea, vomiting or diarrhea. Grip strength equal, strong. Distal pulses palpable; bilat swelling to lower extremities. Pt does wear compression socks but not as much during the summer months. Noah Huffman, BSN Cardiac and Training and development officer

## 2021-10-14 NOTE — Progress Notes (Signed)
Noah Huffman 81 y.o. male Pulmonary Rehab Orientation Note This patient who was referred to Pulmonary Rehab by Dr. Guadalupe Dawn with the diagnosis of Bronchiectasis arrived today in Cardiac and Pulmonary Rehab. He arrived ambulatory with normal gait. He wears oxygen when at night. [employer] is the provider for their DME. Per patient, Noah Huffman uses oxygen at night. Color good, skin warm and dry. Patient is oriented to time and place. Patient's medical history, psychosocial health, and medications reviewed. Psychosocial assessment reveals patient lives with spouse. 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 Patient hobbies include spending time with others and gardening. Patient reports his stress level is low. Areas of stress/anxiety include work. Patient does not exhibit signs of depression. PHQ2/9 score 0/0. Noah Huffman shows good  coping skills with positive outlook on life. Offered emotional support and reassurance. Will continue to monitor. Physical assessment performed by Maurice Small RN. Please see their orientation physical assessment note. Noah Huffman reports he  does take medications as prescribed. Patient states he  follows a regular  diet. The patient has been trying to lose weight through a healthy diet and exercise program.. Patient's weight will be monitored closely. Demonstration and practice of PLB using pulse oximeter. Noah Huffman able to return demonstration satisfactorily. Safety and hand hygiene in the exercise area reviewed with patient. Noah Huffman voices understanding of the information reviewed. Department expectations discussed with patient and achievable goals were set. The patient shows enthusiasm about attending the program and we look forward to working with Noah Huffman. Noah Huffman completed a 6 min walk test today and is scheduled to begin exercise on 10/20/21 at 10:15 am.   1310-1500 Sheppard Plumber, MS, ACSM-CEP

## 2021-10-17 ENCOUNTER — Telehealth (HOSPITAL_COMMUNITY): Payer: Self-pay

## 2021-10-17 NOTE — Telephone Encounter (Signed)
Returned all from [place]. Confirmed that I got his message to start in the Pulmonary Rehab program. Barbarann Ehlers starts Thursday, 10/20/21, at 10:15 am. Pt voiced understanding.

## 2021-10-18 ENCOUNTER — Other Ambulatory Visit: Payer: Self-pay | Admitting: Internal Medicine

## 2021-10-18 ENCOUNTER — Ambulatory Visit (HOSPITAL_COMMUNITY): Payer: PPO

## 2021-10-20 ENCOUNTER — Encounter (HOSPITAL_COMMUNITY)
Admission: RE | Admit: 2021-10-20 | Discharge: 2021-10-20 | Disposition: A | Discharge status: Home / Self Care | Payer: PPO | Source: Ambulatory Visit | Attending: Internal Medicine | Admitting: Internal Medicine

## 2021-10-20 ENCOUNTER — Ambulatory Visit (HOSPITAL_COMMUNITY): Payer: PPO

## 2021-10-20 DIAGNOSIS — J479 Bronchiectasis, uncomplicated: Secondary | ICD-10-CM | POA: Insufficient documentation

## 2021-10-20 NOTE — Progress Notes (Signed)
Daily Session Note  Patient Details  Name: Noah Huffman MRN: [id number] Date of Birth: Feb 17, 1941 Referring Provider:   April Manson Pulmonary Rehab Walk Test from 10/14/2021 in [place]  Referring Provider [name]       Encounter Date: 10/20/2021  Check In:  Session Check In - 10/20/21 1121       Check-In   Supervising physician immediately available to respond to emergencies [facility] immediately available    Physician(s) Dr. [place]    Location [facility]    Staff Present Rosebud Poles, RN, Quentin Ore, MS, ACSM-CEP, Exercise Physiologist;Lisa Ysidro Evert, RN;Randi Olen Cordial BS, ACSM-CEP, Exercise Physiologist    Virtual Visit No    Medication changes reported     No    Fall or balance concerns reported    No    Tobacco Cessation No Change    Warm-up and Cool-down Performed as group-led instruction    Resistance Training Performed Yes    VAD Patient? No    PAD/SET Patient? No      Pain Assessment   Currently in Pain? No/denies    Multiple Pain Sites No             Capillary Blood Glucose: No results found for this or any previous visit (from the past 24 hour(s)).    Social History   Tobacco Use  Smoking Status Former   Packs/day: 2.50   Years: 25.00   Total pack years: 62.50   Types: Cigarettes   Quit date: 03/20/1988   Years since quitting: 33.6  Smokeless Tobacco Never    Goals Met:  Proper associated with RPD/PD & O2 Sat Exercise tolerated well No report of concerns or symptoms today Strength training completed today  Goals Unmet:  Not Applicable  Comments: Service time is from 1020 to 1150.    Dr. Rodman Pickle is Medical Director for Pulmonary Rehab at [facility].

## 2021-10-25 ENCOUNTER — Ambulatory Visit (HOSPITAL_COMMUNITY): Payer: PPO

## 2021-10-25 ENCOUNTER — Encounter (HOSPITAL_COMMUNITY)
Admission: RE | Admit: 2021-10-25 | Discharge: 2021-10-25 | Disposition: A | Discharge status: Home / Self Care | Payer: PPO | Source: Ambulatory Visit | Attending: Internal Medicine | Admitting: Internal Medicine

## 2021-10-25 VITALS — Wt 273.1 lb

## 2021-10-25 DIAGNOSIS — J479 Bronchiectasis, uncomplicated: Secondary | ICD-10-CM | POA: Diagnosis not present

## 2021-10-25 NOTE — Progress Notes (Signed)
Daily Session Note  Patient Details  Name: Noah Huffman MRN: [id number] Date of Birth: 03/07/1941 Referring Provider:   Flowsheet Row Pulmonary Rehab Walk Test from 10/14/2021 in [facility]  Referring Provider [name]       Encounter Date: 10/25/2021  Check In:  Session Check In - 10/25/21 1207       Check-In   Supervising physician immediately available to respond to emergencies [facility] immediately available    Physician(s) Dr. [place]    Location [facility]    Staff Present Kaylee Davis, MS, ACSM-CEP, Exercise Physiologist;Lisa Hughes, RN;Randi Reeve BS, ACSM-CEP, Exercise Physiologist;Olinty Richards, MS, ACSM-CEP, Exercise Physiologist;Johnny Porter, MS, Exercise Physiologist    Virtual Visit No    Medication changes reported     No    Fall or balance concerns reported    No    Tobacco Cessation No Change    Warm-up and Cool-down Performed as group-led instruction    Resistance Training Performed Yes    VAD Patient? No    PAD/SET Patient? No      Pain Assessment   Currently in Pain? No/denies    Pain Score 0-No pain    Multiple Pain Sites No             Capillary Blood Glucose: No results found for this or any previous visit (from the past 24 hour(s)).   Exercise Prescription Changes - 10/25/21 1200       Response to Exercise   Blood Pressure (Admit) 120/58    Blood Pressure (Exercise) 142/62    Blood Pressure (Exit) 120/64    Heart Rate (Admit) 78 bpm    Heart Rate (Exercise) 74 bpm    Heart Rate (Exit) 84 bpm    Oxygen Saturation (Admit) 92 %    Oxygen Saturation (Exercise) 93 %    Oxygen Saturation (Exit) 97 %    Rating of Perceived Exertion (Exercise) 11    Perceived Dyspnea (Exercise) 0    Duration Continue with 30 min of aerobic exercise without signs/symptoms of physical distress.    Intensity THRR unchanged      Progression   Progression Continue to progress workloads to maintain  intensity without signs/symptoms of physical distress.      Resistance Training   Training Prescription Yes    Weight blue bands    Reps 10-15      NuStep   Level 1    SPM 70    Minutes 15    METs 2.2      Arm Ergometer   Level 1    Minutes 15    METs 1.8             Social History   Tobacco Use  Smoking Status Former   Packs/day: 2.50   Years: 25.00   Total pack years: 62.50   Types: Cigarettes   Quit date: 03/20/1988   Years since quitting: 33.6  Smokeless Tobacco Never    Goals Met:  Proper associated with RPD/PD & O2 Sat Exercise tolerated well No report of concerns or symptoms today Strength training completed today  Goals Unmet:  Not Applicable  Comments: Service time is from 1006 to 1138.    Dr. Jane Ellison is Medical Director for Pulmonary Rehab at [facility].  

## 2021-10-26 NOTE — Progress Notes (Signed)
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 Swallow whole., Disp: 90 tablet, Rfl: 3   azelastine (ASTELIN) 0.1 % nasal spray, Place 2 sprays into both nostrils 2 (two) times daily. Use in each nostril as directed, Disp: 30 mL, Rfl: 3   benzonatate (TESSALON) 200 MG capsule, Take 1 capsule (200 mg total) by mouth 3 (three) times daily as needed for cough. (Patient not taking: Reported on 10/20/2021), Disp: 30 capsule, Rfl: 0   BREZTRI AEROSPHERE 160-9-4.8 MCG/ACT AERO, INHALE 2 PUFFS BY MOUTH INTO THE LUNGS IN THE MORNING AND AT BEDTIME, Disp: 10.7 g, Rfl: 5   fluticasone (FLONASE) 50 MCG/ACT nasal spray, Place 1 spray into both nostrils daily., Disp: , Rfl:    gabapentin (NEURONTIN) 600 MG tablet, Take 1,800 mg by mouth 3 (three) times daily., Disp: , Rfl:    guaiFENesin (MUCINEX) 600 MG 12 hr tablet, Take 1 tablet (600 mg total) by mouth 2 (two) times daily., Disp: 60 tablet, Rfl: 3   levocetirizine (XYZAL) 5 MG tablet, Take 1 tablet (5 mg total) by mouth every evening. (Patient not taking: Reported on 10/20/2021), Disp: 90 tablet, Rfl: 1   losartan-hydrochlorothiazide (HYZAAR) 100-25 MG tablet, Take 1 tablet by mouth daily., Disp: , Rfl:     montelukast (SINGULAIR) 10 MG tablet, Take 1 tablet (10 mg total) by mouth at bedtime., Disp: 30 tablet, Rfl: 5   Multiple Vitamins-Minerals (PRESERVISION AREDS 2) CAPS, Take 1 capsule by mouth daily. , Disp: , Rfl:    polyvinyl alcohol (LIQUIFILM TEARS) 1.4 % ophthalmic solution, Place 1 drop into both eyes 2 (two) times daily., Disp: , Rfl:    rosuvastatin (CRESTOR) 10 MG tablet, Take 10 mg by mouth at bedtime., Disp: , Rfl:    sodium chloride (OCEAN) 0.65 % SOLN nasal spray, Place 1 spray into both nostrils daily., Disp: , Rfl:    tamsulosin (FLOMAX) 0.4 MG CAPS capsule, Take 0.4 mg by mouth daily., Disp: , Rfl:   Current Facility-Administered Medications:    0.9 %  sodium chloride infusion, 500 mL, Intravenous, Once, Irene Shipper, MD  Past Medical History: Past Medical History:  Diagnosis Date   Allergy    seasonal   Asthma    Atrial fibrillation ([place])    BPH (benign prostatic hypertrophy) with urinary obstruction    Cataract    removed both eyes   COPD (chronic obstructive pulmonary disease) (HCC)    Diverticulosis    Dysrhythmia    GERD (gastroesophageal reflux disease)    Hyperglycemia    Hyperlipidemia    Hypertension    Inguinal hernia    Neuromuscular disorder (HCC)    neuropathy in feet    Obesity    OSA (obstructive sleep  apnea) 06/06/2013   Osteoarthritis    Sleep apnea    no cpap    Tobacco Use: Social History   Tobacco Use  Smoking Status Former   Packs/day: 2.50   Years: 25.00   Total pack years: 62.50   Types: Cigarettes   Quit date: 03/20/1988   Years since quitting: 33.6  Smokeless Tobacco Never    Labs: Review Flowsheet        No data to display          Capillary Blood Glucose: No results found for: "GLUCAP"   Pulmonary Assessment Scores:  Pulmonary Assessment Scores     Row Name 10/14/21 1525         ADL UCSD   ADL Phase Entry     SOB Score total 39       CAT Score   CAT Score 16       mMRC Score   mMRC Score 1              UCSD: Self-administered rating of dyspnea associated with activities of daily living (ADLs) 6-point scale (0 = "not at all" to 5 = "maximal or unable to do because of breathlessness")  Scoring Scores range from 0 to 120.  Minimally important difference is 5 units  CAT: CAT can identify the health impairment of COPD patients and is better correlated with disease progression.  CAT has a scoring range of zero to 40. The CAT score is classified into four groups of low (less than 10), medium (10 - 20), high (21-30) and very high (31-40) based on the impact level of disease on health status. A CAT score over 10 suggests significant symptoms.  A worsening CAT score could be explained by an exacerbation, poor medication adherence, poor inhaler technique, or progression of COPD or comorbid conditions.  CAT MCID is 2 points  mMRC: mMRC (Modified Medical Research Council) Dyspnea Scale is used to assess the degree of baseline functional disability in patients of respiratory disease due to dyspnea. No minimal important difference is established. A decrease in score of 1 point or greater is considered a positive change.   Pulmonary Function Assessment:  Pulmonary Function Assessment - 10/14/21 1336       Breath   Bilateral Breath Sounds Decreased    Shortness of Breath Yes;Limiting activity;Fear of Shortness of Breath             Exercise Target Goals: Exercise Program Goal: Individual exercise prescription set using results from initial 6 min walk test and THRR while considering  patient's activity barriers and safety.   Exercise Prescription Goal: Initial exercise prescription builds to 30-45 minutes a day of aerobic activity, 2-3 days per week.  Home exercise guidelines will be given to patient during program as part of exercise prescription that the participant will acknowledge.  Activity Barriers & Risk Stratification:  Activity Barriers & Cardiac Risk Stratification -  10/14/21 1415       Activity Barriers & Cardiac Risk Stratification   Activity Barriers Deconditioning;Muscular Weakness;Shortness of Breath;History of Falls   neuropathy in feet            6 Minute Walk:  6 Minute Walk     Row Name 10/14/21 1521         6 Minute Walk   Phase Initial     Distance 1227 feet     Walk Time 6 minutes     # of Rest Breaks 0     MPH  2.32     METS 1.85     RPE 13     Perceived Dyspnea  1     VO2 Peak 6.47     Symptoms No     Resting HR 64 bpm     Resting BP 122/60     Resting Oxygen Saturation  97 %     Exercise Oxygen Saturation  during 6 min walk 93 %     Max Ex. HR 79 bpm     Max Ex. BP 152/68     2 Minute Post BP 134/60       Interval HR   1 Minute HR 71     2 Minute HR 73     3 Minute HR 73     4 Minute HR 73     5 Minute HR 79     6 Minute HR 79     2 Minute Post HR 66     Interval Heart Rate? Yes       Interval Oxygen   Interval Oxygen? Yes     Baseline Oxygen Saturation % 97 %     1 Minute Oxygen Saturation % 91 %     1 Minute Liters of Oxygen 0 L     2 Minute Oxygen Saturation % 96 %     2 Minute Liters of Oxygen 0 L     3 Minute Oxygen Saturation % 94 %     3 Minute Liters of Oxygen 0 L     4 Minute Oxygen Saturation % 99 %     4 Minute Liters of Oxygen 0 L     5 Minute Oxygen Saturation % 95 %     5 Minute Liters of Oxygen 0 L     6 Minute Oxygen Saturation % 93 %     6 Minute Liters of Oxygen 0 L     2 Minute Post Oxygen Saturation % 95 %     2 Minute Post Liters of Oxygen 0 L              Oxygen Initial Assessment:  Oxygen Initial Assessment - 10/14/21 1418       Home Oxygen   Home Oxygen Device Home Concentrator    Sleep Oxygen Prescription Continuous    Liters per minute 2    Home Exercise Oxygen Prescription None    Home Resting Oxygen Prescription None    Compliance with Home Oxygen Use Yes      Initial 6 min Walk   Oxygen Used None      Program Oxygen Prescription   Program Oxygen  Prescription None      Intervention   Short Term Goals To learn and exhibit compliance with exercise, home and travel O2 prescription;To learn and understand importance of monitoring SPO2 with pulse oximeter and demonstrate accurate use of the pulse oximeter.;To learn and understand importance of maintaining oxygen saturations>88%;To learn and demonstrate proper pursed lip breathing techniques or other breathing techniques. ;To learn and demonstrate proper use of respiratory medications    Long  Term Goals Exhibits compliance with exercise, home  and travel O2 prescription;Verbalizes importance of monitoring SPO2 with pulse oximeter and return demonstration;Maintenance of O2 saturations>88%;Exhibits proper breathing techniques, such as pursed lip breathing or other method taught during program session;Compliance with respiratory medication;Demonstrates proper use of MDI's             Oxygen Re-Evaluation:  Oxygen Re-Evaluation     Row Name  10/19/21 1206             Program Oxygen Prescription   Program Oxygen Prescription None         Home Oxygen   Home Oxygen Device Home Concentrator       Sleep Oxygen Prescription Continuous       Liters per minute 2       Home Exercise Oxygen Prescription None       Home Resting Oxygen Prescription None       Compliance with Home Oxygen Use Yes         Goals/Expected Outcomes   Short Term Goals To learn and exhibit compliance with exercise, home and travel O2 prescription;To learn and understand importance of monitoring SPO2 with pulse oximeter and demonstrate accurate use of the pulse oximeter.;To learn and understand importance of maintaining oxygen saturations>88%;To learn and demonstrate proper pursed lip breathing techniques or other breathing techniques. ;To learn and demonstrate proper use of respiratory medications       Long  Term Goals Exhibits compliance with exercise, home  and travel O2 prescription;Verbalizes importance of  monitoring SPO2 with pulse oximeter and return demonstration;Maintenance of O2 saturations>88%;Exhibits proper breathing techniques, such as pursed lip breathing or other method taught during program session;Compliance with respiratory medication;Demonstrates proper use of MDI's       Goals/Expected Outcomes Compliance and understanding of oxygen saturation monitoring and breathing techniques to decrease shortness of breath.                Oxygen Discharge (Final Oxygen Re-Evaluation):  Oxygen Re-Evaluation - 10/19/21 1206       Program Oxygen Prescription   Program Oxygen Prescription None      Home Oxygen   Home Oxygen Device Home Concentrator    Sleep Oxygen Prescription Continuous    Liters per minute 2    Home Exercise Oxygen Prescription None    Home Resting Oxygen Prescription None    Compliance with Home Oxygen Use Yes      Goals/Expected Outcomes   Short Term Goals To learn and exhibit compliance with exercise, home and travel O2 prescription;To learn and understand importance of monitoring SPO2 with pulse oximeter and demonstrate accurate use of the pulse oximeter.;To learn and understand importance of maintaining oxygen saturations>88%;To learn and demonstrate proper pursed lip breathing techniques or other breathing techniques. ;To learn and demonstrate proper use of respiratory medications    Long  Term Goals Exhibits compliance with exercise, home  and travel O2 prescription;Verbalizes importance of monitoring SPO2 with pulse oximeter and return demonstration;Maintenance of O2 saturations>88%;Exhibits proper breathing techniques, such as pursed lip breathing or other method taught during program session;Compliance with respiratory medication;Demonstrates proper use of MDI's    Goals/Expected Outcomes Compliance and understanding of oxygen saturation monitoring and breathing techniques to decrease shortness of breath.             Initial Exercise Prescription:   Initial Exercise Prescription - 10/14/21 1500       Date of Initial Exercise RX and Referring Provider   Date 10/14/21    Referring Provider [name]    Expected Discharge Date 12/22/21      NuStep   Level 1    SPM 70    Minutes 15      Arm Ergometer   Level 1    Watts 5    RPM 40    Minutes 15      Prescription Details   Frequency (times per week) 2  Duration Progress to 30 minutes of continuous aerobic without signs/symptoms of physical distress      Intensity   THRR 40-80% of Max Heartrate 56-112    Ratings of Perceived Exertion 11-13    Perceived Dyspnea 0-4      Progression   Progression Continue to progress workloads to maintain intensity without signs/symptoms of physical distress.      Resistance Training   Training Prescription Yes    Weight blue bands    Reps 10-15             Perform Capillary Blood Glucose checks as needed.  Exercise Prescription Changes:   Exercise Prescription Changes     Row Name 10/25/21 1200             Response to Exercise   Blood Pressure (Admit) 120/58       Blood Pressure (Exercise) 142/62       Blood Pressure (Exit) 120/64       Heart Rate (Admit) 78 bpm       Heart Rate (Exercise) 74 bpm       Heart Rate (Exit) 84 bpm       Oxygen Saturation (Admit) 92 %       Oxygen Saturation (Exercise) 93 %       Oxygen Saturation (Exit) 97 %       Rating of Perceived Exertion (Exercise) 11       Perceived Dyspnea (Exercise) 0       Duration Continue with 30 min of aerobic exercise without signs/symptoms of physical distress.       Intensity THRR unchanged         Progression   Progression Continue to progress workloads to maintain intensity without signs/symptoms of physical distress.         Resistance Training   Training Prescription Yes       Weight blue bands       Reps 10-15         NuStep   Level 1       SPM 70       Minutes 15       METs 2.2         Arm Ergometer   Level 1       Minutes 15        METs 1.8                Exercise Comments:   Exercise Comments     Row Name 10/20/21 1209           Exercise Comments Pt completed first day of exercise. He exercised for 15 min on the arm ergometer and Nustep. Dick averaged 1.7 METs at level 1 on the arm ergometer and 1.9 METs at level 1 on the Nustep. Discussed METs and how to increase METs. He performed the warmup and cooldown without limitations.                Exercise Goals and Review:   Exercise Goals     Row Name 10/14/21 1417 10/19/21 1204           Exercise Goals   Increase Physical Activity Yes Yes      Intervention Provide advice, education, support and counseling about physical activity/exercise needs. Provide advice, education, support and counseling about physical activity/exercise needs.;Develop an individualized exercise prescription for aerobic and resistive training based on initial evaluation findings, risk stratification, comorbidities and participant's personal goals.      Expected  Outcomes Long Term: Add in home exercise to make exercise part of routine and to increase amount of physical activity.;Short Term: Attend rehab on a regular basis to increase amount of physical activity.;Long Term: Exercising regularly at least 3-5 days a week. Long Term: Add in home exercise to make exercise part of routine and to increase amount of physical activity.;Short Term: Attend rehab on a regular basis to increase amount of physical activity.;Long Term: Exercising regularly at least 3-5 days a week.      Increase Strength and Stamina Yes Yes      Intervention Provide advice, education, support and counseling about physical activity/exercise needs.;Develop an individualized exercise prescription for aerobic and resistive training based on initial evaluation findings, risk stratification, comorbidities and participant's personal goals. Provide advice, education, support and counseling about physical activity/exercise  needs.;Develop an individualized exercise prescription for aerobic and resistive training based on initial evaluation findings, risk stratification, comorbidities and participant's personal goals.      Expected Outcomes Short Term: Increase workloads from initial exercise prescription for resistance, speed, and METs.;Short Term: Perform resistance training exercises routinely during rehab and add in resistance training at home;Long Term: Improve cardiorespiratory fitness, muscular endurance and strength as measured by increased METs and functional capacity (6MWT) Short Term: Increase workloads from initial exercise prescription for resistance, speed, and METs.;Short Term: Perform resistance training exercises routinely during rehab and add in resistance training at home;Long Term: Improve cardiorespiratory fitness, muscular endurance and strength as measured by increased METs and functional capacity (6MWT)      Able to understand and use rate of perceived exertion (RPE) scale Yes Yes      Intervention Provide education and explanation on how to use RPE scale Provide education and explanation on how to use RPE scale      Expected Outcomes Short Term: Able to use RPE daily in rehab to express subjective intensity level;Long Term:  Able to use RPE to guide intensity level when exercising independently Short Term: Able to use RPE daily in rehab to express subjective intensity level;Long Term:  Able to use RPE to guide intensity level when exercising independently      Able to understand and use Dyspnea scale Yes Yes      Intervention -- Provide education and explanation on how to use Dyspnea scale      Expected Outcomes Long Term: Able to use Dyspnea scale to guide intensity level when exercising independently Long Term: Able to use Dyspnea scale to guide intensity level when exercising independently;Short Term: Able to use Dyspnea scale daily in rehab to express subjective sense of shortness of breath during  exertion      Knowledge and understanding of Target Heart Rate Range (THRR) Yes Yes      Intervention Provide education and explanation of THRR including how the numbers were predicted and where they are located for reference Provide education and explanation of THRR including how the numbers were predicted and where they are located for reference      Expected Outcomes Short Term: Able to state/look up THRR;Long Term: Able to use THRR to govern intensity when exercising independently;Short Term: Able to use daily as guideline for intensity in rehab Short Term: Able to state/look up THRR;Long Term: Able to use THRR to govern intensity when exercising independently;Short Term: Able to use daily as guideline for intensity in rehab      Understanding of Exercise Prescription Yes Yes      Intervention Provide education, explanation, and written materials on  patient's individual exercise prescription Provide education, explanation, and written materials on patient's individual exercise prescription      Expected Outcomes Short Term: Able to explain program exercise prescription;Long Term: Able to explain home exercise prescription to exercise independently Short Term: Able to explain program exercise prescription;Long Term: Able to explain home exercise prescription to exercise independently               Exercise Goals Re-Evaluation :  Exercise Goals Re-Evaluation     Row Name 10/19/21 1204             Exercise Goal Re-Evaluation   Exercise Goals Review Increase Physical Activity;Increase Strength and Stamina;Able to understand and use rate of perceived exertion (RPE) scale;Able to understand and use Dyspnea scale;Knowledge and understanding of Target Heart Rate Range (THRR);Understanding of Exercise Prescription       Comments Noah Huffman is scheduled to begin exercise this week. Will continue to monitor and progress as able.       Expected Outcomes Through exercise at rehab and home, the patient  will decrease shortness of breath with daily activities and feel confident in carrying out an exercise regimen at home.                Discharge Exercise Prescription (Final Exercise Prescription Changes):  Exercise Prescription Changes - 10/25/21 1200       Response to Exercise   Blood Pressure (Admit) 120/58    Blood Pressure (Exercise) 142/62    Blood Pressure (Exit) 120/64    Heart Rate (Admit) 78 bpm    Heart Rate (Exercise) 74 bpm    Heart Rate (Exit) 84 bpm    Oxygen Saturation (Admit) 92 %    Oxygen Saturation (Exercise) 93 %    Oxygen Saturation (Exit) 97 %    Rating of Perceived Exertion (Exercise) 11    Perceived Dyspnea (Exercise) 0    Duration Continue with 30 min of aerobic exercise without signs/symptoms of physical distress.    Intensity THRR unchanged      Progression   Progression Continue to progress workloads to maintain intensity without signs/symptoms of physical distress.      Resistance Training   Training Prescription Yes    Weight blue bands    Reps 10-15      NuStep   Level 1    SPM 70    Minutes 15    METs 2.2      Arm Ergometer   Level 1    Minutes 15    METs 1.8             Nutrition:  Target Goals: Understanding of nutrition guidelines, daily intake of sodium '1500mg'$ , cholesterol '200mg'$ , calories 30% from fat and 7% or less from saturated fats, daily to have 5 or more servings of fruits and vegetables.  Biometrics:  Pre Biometrics - 10/14/21 1336       Pre Biometrics   Grip Strength 30 kg              Nutrition Therapy Plan and Nutrition Goals:  Nutrition Therapy & Goals - 10/20/21 1118       Nutrition Therapy   Diet Heart Healthy Diet    Drug/Food Interactions Statins/Certain Fruits      Personal Nutrition Goals   Nutrition Goal Patient to choose a variety of fruits, vegetables, whole grains, low fat dairy, lean protein/plant protein, as part of heart healthy lifestyle.    Personal Goal #2 Patient to limit  to '1500mg'$  of  sodium per day.    Personal Goal #3 Patient to identify and limit food sources of saturated fat, trans fat, sodium, and refined carbohydrates.    Comments Patient reports general decrease in portion sizes. He and his wife cook less at home and pick-up food often for dinner. He continues to eat three meals per day and enjoys 1-2 bourbon drinks daily.      Intervention Plan   Intervention Prescribe, educate and counsel regarding individualized specific dietary modifications aiming towards targeted core components such as weight, hypertension, lipid management, diabetes, heart failure and other comorbidities.    Expected Outcomes Short Term Goal: Understand basic principles of dietary content, such as calories, fat, sodium, cholesterol and nutrients.;Long Term Goal: Adherence to prescribed nutrition plan.             Nutrition Assessments:  Nutrition Assessments - 10/20/21 1159       Rate Your Plate Scores   Pre Score 59            MEDIFICTS Score Key: ?70 Need to make dietary changes  40-70 Heart Healthy Diet ? 40 Therapeutic Level Cholesterol Diet  Flowsheet Row PULMONARY REHAB OTHER RESPIRATORY from 10/20/2021 in [facility]  Picture Your Plate Total Score on Admission 59      Picture Your Plate Scores: <97 Unhealthy dietary pattern with much room for improvement. 41-50 Dietary pattern unlikely to meet recommendations for good health and room for improvement. 51-60 More healthful dietary pattern, with some room for improvement.  >60 Healthy dietary pattern, although there may be some specific behaviors that could be improved.    Nutrition Goals Re-Evaluation:  Nutrition Goals Re-Evaluation     [place] Name 10/20/21 1118             Goals   Current Weight 271 lb 13.2 oz (123.3 kg)  obese BMI       Comment Patient reports general decrease in portion sizes consistent with aging. He and his wife cook less at home and pick-up  food often for dinner. Patient appears well nourished and he continues to eat three meals per day and enjoys 1-2 bourbon drinks daily.       Expected Outcome Will continue to monitor intake and eating frequency to support advanced lifestage.                Nutrition Goals Discharge (Final Nutrition Goals Re-Evaluation):  Nutrition Goals Re-Evaluation - 10/20/21 1118       Goals   Current Weight 271 lb 13.2 oz (123.3 kg)   obese BMI   Comment Patient reports general decrease in portion sizes consistent with aging. He and his wife cook less at home and pick-up food often for dinner. Patient appears well nourished and he continues to eat three meals per day and enjoys 1-2 bourbon drinks daily.    Expected Outcome Will continue to monitor intake and eating frequency to support advanced lifestage.             Psychosocial: Target Goals: Acknowledge presence or absence of significant depression and/or stress, maximize coping skills, provide positive support system. Participant is able to verbalize types and ability to use techniques and skills needed for reducing stress and depression.  Initial Review & Psychosocial Screening:  Initial Psych Review & Screening - 10/14/21 1406       Initial Review   Current issues with None Identified      Family Dynamics   Good Support System? Yes  Comments spouse, children, and grandchildren      Barriers   Psychosocial barriers to participate in program There are no identifiable barriers or psychosocial needs.      Screening Interventions   Interventions Encouraged to exercise             Quality of Life Scores:  Scores of 19 and below usually indicate a poorer quality of life in these areas.  A difference of  2-3 points is a clinically meaningful difference.  A difference of 2-3 points in the total score of the Quality of Life Index has been associated with significant improvement in overall quality of life, self-image, physical  symptoms, and general health in studies assessing change in quality of life.  PHQ-9: Review Flowsheet       10/14/2021  Depression screen PHQ 2/9  Decreased Interest 0  Down, Depressed, Hopeless 0  PHQ - 2 Score 0  Altered sleeping 0  Tired, decreased energy 0  Change in appetite 0  Feeling bad or failure about yourself  0  Trouble concentrating 0  Moving slowly or fidgety/restless 0  Suicidal thoughts 0  PHQ-9 Score 0   Interpretation of Total Score  Total Score Depression Severity:  1-4 = Minimal depression, 5-9 = Mild depression, 10-14 = Moderate depression, 15-19 = Moderately severe depression, 20-27 = Severe depression   Psychosocial Evaluation and Intervention:  Psychosocial Evaluation - 10/14/21 1410       Psychosocial Evaluation & Interventions   Interventions Encouraged to exercise with the program and follow exercise prescription    Expected Outcomes For Dick to participate in Pulmonary Rehab without psychosocial concerns.    Continue Psychosocial Services  Follow up required by staff             Psychosocial Re-Evaluation:  Psychosocial Re-Evaluation     [place] Name 10/26/21 0848             Psychosocial Re-Evaluation   Current issues with None Identified       Comments Noah Huffman has attented 2 session  thus far he has had no psychosocial barriers. We will continue to monitor.       Expected Outcomes For him to attend PR without psychosocial issues or concerns.       Interventions Encouraged to attend Pulmonary Rehabilitation for the exercise       Continue Psychosocial Services  Follow up required by staff                Psychosocial Discharge (Final Psychosocial Re-Evaluation):  Psychosocial Re-Evaluation - 10/26/21 0848       Psychosocial Re-Evaluation   Current issues with None Identified    Comments Noah Huffman has attented 2 session  thus far he has had no psychosocial barriers. We will continue to monitor.    Expected Outcomes For him to attend PR  without psychosocial issues or concerns.    Interventions Encouraged to attend Pulmonary Rehabilitation for the exercise    Continue Psychosocial Services  Follow up required by staff             Education: Education Goals: Education classes will be provided on a weekly basis, covering required topics. Participant will state understanding/return demonstration of topics presented.  Learning Barriers/Preferences:  Learning Barriers/Preferences - 10/14/21 1410       Learning Barriers/Preferences   Learning Barriers Sight   wears glasses   Learning Preferences Verbal Instruction;Written Material;Pictoral;Skilled Demonstration             Education Topics:  Risk Factor Reduction:  -Group instruction that is supported by a PowerPoint presentation. Instructor discusses the definition of a risk factor, different risk factors for pulmonary disease, and how the heart and lungs work together.     Nutrition for Pulmonary Patient:  -Group instruction provided by PowerPoint slides, verbal discussion, and written materials to support subject matter. The instructor gives an explanation and review of healthy diet recommendations, which includes a discussion on weight management, recommendations for fruit and vegetable consumption, as well as protein, fluid, caffeine, fiber, sodium, sugar, and alcohol. Tips for eating when patients are short of breath are discussed.   Pursed Lip Breathing:  -Group instruction that is supported by demonstration and informational handouts. Instructor discusses the benefits of pursed lip and diaphragmatic breathing and detailed demonstration on how to preform both.     Oxygen Safety:  -Group instruction provided by PowerPoint, verbal discussion, and written material to support subject matter. There is an overview of "What is Oxygen" and "Why do we need it".  Instructor also reviews how to create a safe environment for oxygen use, the importance of using oxygen as  prescribed, and the risks of noncompliance. There is a brief discussion on traveling with oxygen and resources the patient may utilize.   Oxygen Equipment:  -Group instruction provided by [facility] Staff utilizing handouts, written materials, and equipment demonstrations.   Signs and Symptoms:  -Group instruction provided by written material and verbal discussion to support subject matter. Warning signs and symptoms of infection, stroke, and heart attack are reviewed and when to call the physician/911 reinforced. Tips for preventing the spread of infection discussed.   Advanced Directives:  -Group instruction provided by verbal instruction and written material to support subject matter. Instructor reviews Advanced Directive laws and proper instruction for filling out document.   Pulmonary Video:  -Group video education that reviews the importance of medication and oxygen compliance, exercise, good nutrition, pulmonary hygiene, and pursed lip and diaphragmatic breathing for the pulmonary patient.   Exercise for the Pulmonary Patient:  -Group instruction that is supported by a PowerPoint presentation. Instructor discusses benefits of exercise, core components of exercise, frequency, duration, and intensity of an exercise routine, importance of utilizing pulse oximetry during exercise, safety while exercising, and options of places to exercise outside of rehab.   Flowsheet Row PULMONARY REHAB OTHER RESPIRATORY from 10/20/2021 in [place]  Date 10/20/21  Educator Donnetta Simpers  Instruction Review Code 1- Verbalizes Understanding       Pulmonary Medications:  -Verbally interactive group education provided by instructor with focus on inhaled medications and proper administration.   Anatomy and Physiology of the Respiratory System and Intimacy:  -Group instruction provided by PowerPoint, verbal discussion, and written material to support subject matter.  Instructor reviews respiratory cycle and anatomical components of the respiratory system and their functions. Instructor also reviews differences in obstructive and restrictive respiratory diseases with examples of each. Intimacy, Sex, and Sexuality differences are reviewed with a discussion on how relationships can change when diagnosed with pulmonary disease. Common sexual concerns are reviewed.   MD DAY -A group question and answer session with a medical doctor that allows participants to ask questions that relate to their pulmonary disease state.   OTHER EDUCATION -Group or individual verbal, written, or video instructions that support the educational goals of the pulmonary rehab program.   Holiday Eating Survival Tips:  -Group instruction provided by PowerPoint slides, verbal discussion, and written materials to support subject matter.  The instructor gives patients tips, tricks, and techniques to help them not only survive but enjoy the holidays despite the onslaught of food that accompanies the holidays.   Knowledge Questionnaire Score:  Knowledge Questionnaire Score - 10/14/21 1525       Knowledge Questionnaire Score   Pre Score 15/18             Core Components/Risk Factors/Patient Goals at Admission:  Personal Goals and Risk Factors at Admission - 10/14/21 1411       Core Components/Risk Factors/Patient Goals on Admission    Weight Management Weight Loss    Improve shortness of breath with ADL's Yes    Intervention Provide education, individualized exercise plan and daily activity instruction to help decrease symptoms of SOB with activities of daily living.    Expected Outcomes Short Term: Improve cardiorespiratory fitness to achieve a reduction of symptoms when performing ADLs;Long Term: Be able to perform more ADLs without symptoms or delay the onset of symptoms             Core Components/Risk Factors/Patient Goals Review:   Goals and Risk Factor Review      Row Name 10/26/21 0850             Core Components/Risk Factors/Patient Goals Review   Personal Goals Review Other;Improve shortness of breath with ADL's;Develop more efficient breathing techniques such as purse lipped breathing and diaphragmatic breathing and practicing self-pacing with activity.       Review Noah Huffman has attended 2 PR sessions.Tthus far he has tolerated them well.. He has exercised on the arm egometer and nu step without any issues.We will continue to monitor core components.       Expected Outcomes See admission goals.                Core Components/Risk Factors/Patient Goals at Discharge (Final Review):   Goals and Risk Factor Review - 10/26/21 0850       Core Components/Risk Factors/Patient Goals Review   Personal Goals Review Other;Improve shortness of breath with ADL's;Develop more efficient breathing techniques such as purse lipped breathing and diaphragmatic breathing and practicing self-pacing with activity.    Review Noah Huffman has attended 2 PR sessions.Tthus far he has tolerated them well.. He has exercised on the arm egometer and nu step without any issues.We will continue to monitor core components.    Expected Outcomes See admission goals.             ITP Comments:  Noah Huffman has attended 2 sessions of the PR program.   Comments: Dr. Rodman Pickle is Medical Director for Pulmonary Rehab at [facility].

## 2021-10-27 ENCOUNTER — Ambulatory Visit (HOSPITAL_COMMUNITY): Payer: PPO

## 2021-10-27 ENCOUNTER — Encounter (HOSPITAL_COMMUNITY)
Admission: RE | Admit: 2021-10-27 | Discharge: 2021-10-27 | Disposition: A | Discharge status: Home / Self Care | Payer: PPO | Source: Ambulatory Visit | Attending: Internal Medicine | Admitting: Internal Medicine

## 2021-10-27 DIAGNOSIS — J479 Bronchiectasis, uncomplicated: Secondary | ICD-10-CM | POA: Diagnosis not present

## 2021-10-27 NOTE — Progress Notes (Signed)
Daily Session Note  Patient Details  Name: Noah Huffman MRN: [id number] Date of Birth: [birth year] Referring Provider:   April Manson Pulmonary Rehab Walk Test from 10/14/2021 in [place]  Referring Provider [name]       Encounter Date: 10/27/2021  Check In:  Session Check In - 10/27/21 1131       Check-In   Supervising physician immediately available to respond to emergencies [facility] immediately available    Physician(s) Dr. Posey Pronto    Location [facility]    Staff Present Rosebud Poles, RN, BSN;Merrie Epler Ysidro Evert, Cathleen Fears, MS, ACSM-CEP, Exercise Physiologist;Randi Yevonne Pax, ACSM-CEP, Exercise Physiologist    Virtual Visit No    Medication changes reported     No    Fall or balance concerns reported    No    Tobacco Cessation No Change    Warm-up and Cool-down Performed as group-led instruction    Resistance Training Performed Yes    VAD Patient? No    PAD/SET Patient? No      Pain Assessment   Currently in Pain? No/denies    Multiple Pain Sites No             Capillary Blood Glucose: No results found for this or any previous visit (from the past 24 hour(s)).    Social History   Tobacco Use  Smoking Status Former   Packs/day: 2.50   Years: 25.00   Total pack years: 62.50   Types: Cigarettes   Quit date: 03/20/1988   Years since quitting: 33.6  Smokeless Tobacco Never    Goals Met:  Proper associated with RPD/PD & O2 Sat Exercise tolerated well No report of concerns or symptoms today Strength training completed today  Goals Unmet:  Not Applicable  Comments: Service time is from 1021 to [place]    Dr. Rodman Pickle is Medical Director for Pulmonary Rehab at [facility].

## 2021-11-01 ENCOUNTER — Ambulatory Visit (HOSPITAL_COMMUNITY): Payer: PPO

## 2021-11-01 ENCOUNTER — Ambulatory Visit (HOSPITAL_BASED_OUTPATIENT_CLINIC_OR_DEPARTMENT_OTHER)
Admission: RE | Admit: 2021-11-01 | Discharge: 2021-11-01 | Disposition: A | Discharge status: Home / Self Care | Payer: PPO | Source: Ambulatory Visit | Attending: Internal Medicine | Admitting: Internal Medicine

## 2021-11-01 DIAGNOSIS — I700 Atherosclerosis of aorta: Secondary | ICD-10-CM | POA: Insufficient documentation

## 2021-11-01 DIAGNOSIS — J45909 Unspecified asthma, uncomplicated: Secondary | ICD-10-CM | POA: Diagnosis not present

## 2021-11-01 DIAGNOSIS — R911 Solitary pulmonary nodule: Secondary | ICD-10-CM | POA: Diagnosis not present

## 2021-11-01 DIAGNOSIS — I2510 Atherosclerotic heart disease of native coronary artery without angina pectoris: Secondary | ICD-10-CM | POA: Diagnosis not present

## 2021-11-01 DIAGNOSIS — I7121 Aneurysm of the ascending aorta, without rupture: Secondary | ICD-10-CM | POA: Insufficient documentation

## 2021-11-01 DIAGNOSIS — Z836 Family history of other diseases of the respiratory system: Secondary | ICD-10-CM | POA: Diagnosis not present

## 2021-11-01 DIAGNOSIS — J479 Bronchiectasis, uncomplicated: Secondary | ICD-10-CM | POA: Diagnosis not present

## 2021-11-03 ENCOUNTER — Encounter (HOSPITAL_COMMUNITY)
Admission: RE | Admit: 2021-11-03 | Discharge: 2021-11-03 | Disposition: A | Discharge status: Home / Self Care | Payer: PPO | Source: Ambulatory Visit | Attending: Internal Medicine | Admitting: Internal Medicine

## 2021-11-03 ENCOUNTER — Ambulatory Visit (HOSPITAL_COMMUNITY): Payer: PPO

## 2021-11-03 DIAGNOSIS — J479 Bronchiectasis, uncomplicated: Secondary | ICD-10-CM | POA: Diagnosis not present

## 2021-11-03 NOTE — Progress Notes (Signed)
Daily Session Note  Patient Details  Name: Noah Huffman MRN: [id number] Date of Birth: 1940-09-23 Referring Provider:   April Manson Pulmonary Rehab Walk Test from 10/14/2021 in [place]  Referring Provider [name]       Encounter Date: 11/03/2021  Check In:  Session Check In - 11/03/21 1125       Check-In   Supervising physician immediately available to respond to emergencies [facility] immediately available    Physician(s) Dr. Lonny Prude    [facility]    Staff Present Rosebud Poles, RN, BSN;Ramon Dredge, RN, Fernande Bras, MS, ACSM-CEP, Exercise Physiologist;Lisa Ysidro Evert, RN    Virtual Visit No    Medication changes reported     No    Fall or balance concerns reported    No    Tobacco Cessation No Change    Warm-up and Cool-down Performed as group-led instruction    Resistance Training Performed Yes    VAD Patient? No    PAD/SET Patient? No      Pain Assessment   Currently in Pain? No/denies    Multiple Pain Sites No             Capillary Blood Glucose: No results found for this or any previous visit (from the past 24 hour(s)).    Social History   Tobacco Use  Smoking Status Former   Packs/day: 2.50   Years: 25.00   Total pack years: 62.50   Types: Cigarettes   Quit date: 03/20/1988   Years since quitting: 33.6  Smokeless Tobacco Never    Goals Met:  Proper associated with RPD/PD & O2 Sat Exercise tolerated well No report of concerns or symptoms today Strength training completed today  Goals Unmet:  Not Applicable  Comments: Service time is from 1022 to 1144.    Dr. Rodman Pickle is Medical Director for Pulmonary Rehab at [facility].

## 2021-11-08 ENCOUNTER — Ambulatory Visit (HOSPITAL_COMMUNITY): Payer: PPO

## 2021-11-08 ENCOUNTER — Encounter (HOSPITAL_COMMUNITY)
Admission: RE | Admit: 2021-11-08 | Discharge: 2021-11-08 | Disposition: A | Discharge status: Home / Self Care | Payer: PPO | Source: Ambulatory Visit | Attending: Internal Medicine | Admitting: Internal Medicine

## 2021-11-08 VITALS — Wt 277.3 lb

## 2021-11-08 DIAGNOSIS — H6123 Impacted cerumen, bilateral: Secondary | ICD-10-CM | POA: Diagnosis not present

## 2021-11-08 DIAGNOSIS — J479 Bronchiectasis, uncomplicated: Secondary | ICD-10-CM

## 2021-11-08 NOTE — Progress Notes (Signed)
Daily Session Note  Patient Details  Name: Noah Huffman MRN: [id number] Date of Birth: September 13, 1940 Referring Provider:   April Manson Pulmonary Rehab Walk Test from 10/14/2021 in [place]  Referring Provider [name]       Encounter Date: 11/08/2021  Check In:  Session Check In - 11/08/21 1118       Check-In   Supervising physician immediately available to respond to emergencies [facility] immediately available    Physician(s) Dr. Lonny Prude    [facility]    Staff Present Rosebud Poles, RN, BSN;Randi Olen Cordial BS, ACSM-CEP, Exercise Physiologist;Kaylee Rosana Hoes, MS, ACSM-CEP, Exercise Physiologist;[place] Ndiaye Ysidro Evert, RN    Virtual Visit No    Medication changes reported     No    Fall or balance concerns reported    No    Tobacco Cessation No Change    Warm-up and Cool-down Performed as group-led instruction    Resistance Training Performed Yes    VAD Patient? No    PAD/SET Patient? No      Pain Assessment   Currently in Pain? No/denies    Multiple Pain Sites No             Capillary Blood Glucose: No results found for this or any previous visit (from the past 24 hour(s)).   Exercise Prescription Changes - 11/08/21 1200       Response to Exercise   Blood Pressure (Admit) 144/72    Blood Pressure (Exercise) 126/60    Blood Pressure (Exit) 120/54    Heart Rate (Admit) 81 bpm    Heart Rate (Exercise) 84 bpm    Heart Rate (Exit) 76 bpm    Oxygen Saturation (Admit) 95 %    Oxygen Saturation (Exercise) 92 %    Oxygen Saturation (Exit) 95 %    Rating of Perceived Exertion (Exercise) 9    Perceived Dyspnea (Exercise) 1    Duration Continue with 30 min of aerobic exercise without signs/symptoms of physical distress.    Intensity THRR unchanged      Progression   Progression Continue to progress workloads to maintain intensity without signs/symptoms of physical distress.      Resistance Training   Training  Prescription Yes    Weight Blue bands    Reps 10-15    Time 10 Minutes      NuStep   Level 4    SPM 80    Minutes 15    METs 2.5      Arm Ergometer   Level 3    Minutes 15    METs 2.7             Social History   Tobacco Use  Smoking Status Former   Packs/day: 2.50   Years: 25.00   Total pack years: 62.50   Types: Cigarettes   Quit date: 03/20/1988   Years since quitting: 33.6  Smokeless Tobacco Never    Goals Met:  Proper associated with RPD/PD & O2 Sat Exercise tolerated well No report of concerns or symptoms today Strength training completed today  Goals Unmet:  Not Applicable  Comments: Service time is from [place] to 1132    Dr. Rodman Pickle is Medical Director for Pulmonary Rehab at [facility].

## 2021-11-10 ENCOUNTER — Ambulatory Visit (HOSPITAL_COMMUNITY): Payer: PPO

## 2021-11-10 ENCOUNTER — Encounter (HOSPITAL_COMMUNITY)
Admission: RE | Admit: 2021-11-10 | Discharge: 2021-11-10 | Disposition: A | Discharge status: Home / Self Care | Payer: PPO | Source: Ambulatory Visit | Attending: Internal Medicine | Admitting: Internal Medicine

## 2021-11-10 DIAGNOSIS — J479 Bronchiectasis, uncomplicated: Secondary | ICD-10-CM | POA: Diagnosis not present

## 2021-11-10 NOTE — Progress Notes (Signed)
Daily Session Note  Patient Details  Name: Noah Huffman MRN: [id number] Date of Birth: 11/27/1940 Referring Provider:   April Manson Pulmonary Rehab Walk Test from 10/14/2021 in [place]  Referring Provider [name]       Encounter Date: 11/10/2021  Check In:  Session Check In - 11/10/21 1027       Check-In   Supervising physician immediately available to respond to emergencies [facility] immediately available    Physician(s) Dr. Florene Glen    [facility]    Staff Present Rosebud Poles, RN, BSN;Randi Olen Cordial BS, ACSM-CEP, Exercise Physiologist;Kaylee Rosana Hoes, MS, ACSM-CEP, Exercise Physiologist;Lisa Ysidro Evert, RN    Virtual Visit No    Medication changes reported     No    Fall or balance concerns reported    No    Tobacco Cessation No Change    Warm-up and Cool-down Performed as group-led instruction    Resistance Training Performed Yes    VAD Patient? No    PAD/SET Patient? No      Pain Assessment   Currently in Pain? No/denies    Multiple Pain Sites No             Capillary Blood Glucose: No results found for this or any previous visit (from the past 24 hour(s)).    Social History   Tobacco Use  Smoking Status Former   Packs/day: 2.50   Years: 25.00   Total pack years: 62.50   Types: Cigarettes   Quit date: 03/20/1988   Years since quitting: 33.6  Smokeless Tobacco Never    Goals Met:  Proper associated with RPD/PD & O2 Sat Exercise tolerated well No report of concerns or symptoms today Strength training completed today  Goals Unmet:  Not Applicable  Comments: Service time is from 1005 to 1135.    Dr. Rodman Pickle is Medical Director for Pulmonary Rehab at [facility].

## 2021-11-11 NOTE — Progress Notes (Signed)
[facility] Summary Report / Loop Recorder 

## 2021-11-13 LAB — CUP PACEART REMOTE DEVICE CHECK
Date Time Interrogation Session: 20230817230409
Implantable Pulse Generator Implant Date: 20211122

## 2021-11-14 ENCOUNTER — Ambulatory Visit (INDEPENDENT_AMBULATORY_CARE_PROVIDER_SITE_OTHER): Payer: PPO

## 2021-11-14 DIAGNOSIS — I482 Chronic atrial fibrillation, unspecified: Secondary | ICD-10-CM | POA: Diagnosis not present

## 2021-11-15 ENCOUNTER — Ambulatory Visit (HOSPITAL_COMMUNITY): Payer: PPO

## 2021-11-15 ENCOUNTER — Encounter (HOSPITAL_COMMUNITY)
Admission: RE | Admit: 2021-11-15 | Discharge: 2021-11-15 | Disposition: A | Discharge status: Home / Self Care | Payer: PPO | Source: Ambulatory Visit | Attending: Internal Medicine | Admitting: Internal Medicine

## 2021-11-15 DIAGNOSIS — J479 Bronchiectasis, uncomplicated: Secondary | ICD-10-CM | POA: Diagnosis not present

## 2021-11-15 NOTE — Progress Notes (Signed)
Daily Session Note  Patient Details  Name: Noah Huffman MRN: [id number] Date of Birth: Jun 19, 1940 Referring Provider:   April Manson Pulmonary Rehab Walk Test from 10/14/2021 in [place]  Referring Provider [name]       Encounter Date: 11/15/2021  Check In:  Session Check In - 11/15/21 1121       Check-In   Supervising physician immediately available to respond to emergencies [facility] immediately available    Physician(s) Dr. Florene Glen    [facility]    Staff Present Rosebud Poles, RN, BSN;Juanpablo Ciresi Olen Cordial BS, ACSM-CEP, Exercise Physiologist;Kaylee Rosana Hoes, MS, ACSM-CEP, Exercise Physiologist;Lisa Ysidro Evert, RN    Virtual Visit No    Medication changes reported     No    Fall or balance concerns reported    No    Tobacco Cessation No Change    Warm-up and Cool-down Performed as group-led instruction    Resistance Training Performed Yes    VAD Patient? No    PAD/SET Patient? No      Pain Assessment   Currently in Pain? No/denies    Multiple Pain Sites No             Capillary Blood Glucose: No results found for this or any previous visit (from the past 24 hour(s)).   Exercise Prescription Changes - 11/15/21 1200       Home Exercise Plan   Plans to continue exercise at [facility] (comment)   [place]   Frequency Add 1 additional day to program exercise sessions.    Initial Home Exercises Provided 11/15/21             Social History   Tobacco Use  Smoking Status Former   Packs/day: 2.50   Years: 25.00   Total pack years: 62.50   Types: Cigarettes   Quit date: 03/20/1988   Years since quitting: 33.6  Smokeless Tobacco Never    Goals Met:  Independence with exercise equipment Exercise tolerated well No report of concerns or symptoms today Strength training completed today  Goals Unmet:  Not Applicable  Comments: Service time is from 1015 to 1135.    Dr. Rodman Pickle is  Medical Director for Pulmonary Rehab at [facility].

## 2021-11-15 NOTE — Progress Notes (Addendum)
Home Exercise Prescription I have reviewed a Home Exercise Prescription with [name]. He is not currently doing cardiovascular exercise as he is gardening and doing yardwork. Discussed adding atleast one nonrehab day of 30 min of exercise. He plans to join the [employer] or [employer] soon to continue to exercise after rehab. He likes the machines here in rehab. Expect good compliance.The patient stated that their goals were to continue with his hobbies. We reviewed exercise guidelines, target heart rate during exercise, RPE Scale, weather conditions, endpoints for exercise, warmup and cool down. The patient is encouraged to come to me with any questions. I will continue to follow up with the patient to assist them with progression and safety.    Ranveer Wahlstrom [place], [place], ACSM-CEP 11/15/2021 12:03 PM

## 2021-11-17 ENCOUNTER — Ambulatory Visit (HOSPITAL_COMMUNITY): Payer: PPO

## 2021-11-17 ENCOUNTER — Encounter (HOSPITAL_COMMUNITY)
Admission: RE | Admit: 2021-11-17 | Discharge: 2021-11-17 | Disposition: A | Discharge status: Home / Self Care | Payer: PPO | Source: Ambulatory Visit | Attending: Internal Medicine | Admitting: Internal Medicine

## 2021-11-17 DIAGNOSIS — J479 Bronchiectasis, uncomplicated: Secondary | ICD-10-CM | POA: Diagnosis not present

## 2021-11-17 DIAGNOSIS — H903 Sensorineural hearing loss, bilateral: Secondary | ICD-10-CM | POA: Diagnosis not present

## 2021-11-17 NOTE — Progress Notes (Signed)
Daily Session Note  Patient Details  Name: Noah Huffman MRN: [id number] Date of Birth: 06/10/1940 Referring Provider:   April Manson Pulmonary Rehab Walk Test from 10/14/2021 in [place]  Referring Provider [name]       Encounter Date: 11/17/2021  Check In:  Session Check In - 11/17/21 1127       Check-In   Supervising physician immediately available to respond to emergencies [facility] immediately available    Physician(s) Dr. Verlon Au    [facility]    Staff Present Rosebud Poles, RN, BSN;Randi Olen Cordial BS, ACSM-CEP, Exercise Physiologist;Kaylee Rosana Hoes, MS, ACSM-CEP, Exercise Physiologist;Lisa Ysidro Evert, RN    Virtual Visit No    Medication changes reported     No    Fall or balance concerns reported    No    Tobacco Cessation No Change    Warm-up and Cool-down Performed as group-led instruction    Resistance Training Performed Yes    VAD Patient? No    PAD/SET Patient? No      Pain Assessment   Currently in Pain? No/denies    Multiple Pain Sites No             Capillary Blood Glucose: No results found for this or any previous visit (from the past 24 hour(s)).    Social History   Tobacco Use  Smoking Status Former   Packs/day: 2.50   Years: 25.00   Total pack years: 62.50   Types: Cigarettes   Quit date: 03/20/1988   Years since quitting: 33.6  Smokeless Tobacco Never    Goals Met:  Proper associated with RPD/PD & O2 Sat Exercise tolerated well No report of concerns or symptoms today Strength training completed today  Goals Unmet:  Not Applicable  Comments: Service time is from 1014 to 1134.    Dr. Rodman Pickle is Medical Director for Pulmonary Rehab at [facility].

## 2021-11-22 ENCOUNTER — Ambulatory Visit (HOSPITAL_COMMUNITY): Payer: PPO

## 2021-11-22 ENCOUNTER — Encounter (HOSPITAL_COMMUNITY)
Admission: RE | Admit: 2021-11-22 | Discharge: 2021-11-22 | Disposition: A | Discharge status: Home / Self Care | Payer: PPO | Source: Ambulatory Visit | Attending: Internal Medicine | Admitting: Internal Medicine

## 2021-11-22 VITALS — Wt 273.1 lb

## 2021-11-22 DIAGNOSIS — J479 Bronchiectasis, uncomplicated: Secondary | ICD-10-CM | POA: Diagnosis not present

## 2021-11-22 NOTE — Progress Notes (Signed)
Daily Session Note  Patient Details  Name: BLAYZE HAEN MRN: [id number] Date of Birth: 04/02/1940 Referring Provider:   April Manson Pulmonary Rehab Walk Test from 10/14/2021 in [place]  Referring Provider [name]       Encounter Date: 11/22/2021  Check In:  Session Check In - 11/22/21 1122       Check-In   Supervising physician immediately available to respond to emergencies [facility] immediately available    Physician(s) Dr. Verlon Au    Location [facility]    Staff Present Rosebud Poles, RN, BSN;Shakirra Buehler Olen Cordial BS, ACSM-CEP, Exercise Physiologist;Kaylee Rosana Hoes, MS, ACSM-CEP, Exercise Physiologist;Lisa Ysidro Evert, RN    Virtual Visit No    Medication changes reported     No    Fall or balance concerns reported    No    Tobacco Cessation No Change    Warm-up and Cool-down Performed as group-led instruction    Resistance Training Performed Yes    VAD Patient? No    PAD/SET Patient? No      Pain Assessment   Currently in Pain? No/denies    Multiple Pain Sites No             Capillary Blood Glucose: No results found for this or any previous visit (from the past 24 hour(s)).   Exercise Prescription Changes - 11/22/21 1200       Response to Exercise   Blood Pressure (Admit) 130/60    Blood Pressure (Exercise) 144/80    Blood Pressure (Exit) 96/56    Heart Rate (Admit) 73 bpm    Heart Rate (Exercise) 83 bpm    Heart Rate (Exit) 77 bpm    Oxygen Saturation (Admit) 96 %    Oxygen Saturation (Exercise) 95 %    Oxygen Saturation (Exit) 94 %    Rating of Perceived Exertion (Exercise) 10    Perceived Dyspnea (Exercise) 1    Duration Continue with 30 min of aerobic exercise without signs/symptoms of physical distress.    Intensity THRR unchanged      Progression   Progression Continue to progress workloads to maintain intensity without signs/symptoms of physical distress.      Resistance Training   Training  Prescription Yes    Weight Blue bands    Reps 10-15    Time 10 Minutes      NuStep   Level 5    SPM 80    Minutes 15    METs 3.2      Arm Ergometer   Level 4    Minutes 15    METs 2.1             Social History   Tobacco Use  Smoking Status Former   Packs/day: 2.50   Years: 25.00   Total pack years: 62.50   Types: Cigarettes   Quit date: 03/20/1988   Years since quitting: 33.6  Smokeless Tobacco Never    Goals Met:  Independence with exercise equipment Exercise tolerated well No report of concerns or symptoms today Strength training completed today  Goals Unmet:  Not Applicable  Comments: Service time is from 1013 to 1142.    Dr. Rodman Pickle is Medical Director for Pulmonary Rehab at [facility].

## 2021-11-23 NOTE — Progress Notes (Signed)
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 Swallow whole., Disp: 90 tablet, Rfl: 3   azelastine (ASTELIN) 0.1 % nasal spray, Place 2 sprays into both nostrils 2 (two) times daily. Use in each nostril as directed, Disp: 30 mL, Rfl: 3   benzonatate (TESSALON) 200 MG capsule, Take 1 capsule (200 mg total) by mouth 3 (three) times daily as needed for cough. (Patient not taking: Reported on 10/20/2021), Disp: 30 capsule, Rfl: 0   BREZTRI AEROSPHERE 160-9-4.8 MCG/ACT AERO, INHALE 2 PUFFS BY MOUTH INTO THE LUNGS IN THE MORNING AND AT BEDTIME, Disp: 10.7 g, Rfl: 5   fluticasone (FLONASE) 50 MCG/ACT nasal spray, Place 1 spray into both nostrils daily., Disp: , Rfl:    gabapentin (NEURONTIN) 600 MG tablet, Take 1,800 mg by mouth 3 (three) times daily., Disp: , Rfl:    guaiFENesin (MUCINEX) 600 MG 12 hr tablet, Take 1 tablet (600 mg total) by mouth 2 (two) times daily., Disp: 60 tablet, Rfl: 3   levocetirizine (XYZAL) 5 MG tablet, Take 1 tablet (5 mg total) by mouth every evening. (Patient not taking: Reported on 10/20/2021), Disp: 90 tablet, Rfl: 1   losartan-hydrochlorothiazide (HYZAAR) 100-25 MG tablet, Take 1 tablet by mouth daily., Disp: , Rfl:     montelukast (SINGULAIR) 10 MG tablet, Take 1 tablet (10 mg total) by mouth at bedtime., Disp: 30 tablet, Rfl: 5   Multiple Vitamins-Minerals (PRESERVISION AREDS 2) CAPS, Take 1 capsule by mouth daily. , Disp: , Rfl:    polyvinyl alcohol (LIQUIFILM TEARS) 1.4 % ophthalmic solution, Place 1 drop into both eyes 2 (two) times daily., Disp: , Rfl:    rosuvastatin (CRESTOR) 10 MG tablet, Take 10 mg by mouth at bedtime., Disp: , Rfl:    sodium chloride (OCEAN) 0.65 % SOLN nasal spray, Place 1 spray into both nostrils daily., Disp: , Rfl:    tamsulosin (FLOMAX) 0.4 MG CAPS capsule, Take 0.4 mg by mouth daily., Disp: , Rfl:   Current Facility-Administered Medications:    0.9 %  sodium chloride infusion, 500 mL, Intravenous, Once, Irene Shipper, MD  Past Medical History: Past Medical History:  Diagnosis Date   Allergy    seasonal   Asthma    Atrial fibrillation ([place])    BPH (benign prostatic hypertrophy) with urinary obstruction    Cataract    removed both eyes   COPD (chronic obstructive pulmonary disease) (HCC)    Diverticulosis    Dysrhythmia    GERD (gastroesophageal reflux disease)    Hyperglycemia    Hyperlipidemia    Hypertension    Inguinal hernia    Neuromuscular disorder (HCC)    neuropathy in feet    Obesity    OSA (obstructive sleep  apnea) 06/06/2013   Osteoarthritis    Sleep apnea    no cpap    Tobacco Use: Social History   Tobacco Use  Smoking Status Former   Packs/day: 2.50   Years: 25.00   Total pack years: 62.50   Types: Cigarettes   Quit date: 03/20/1988   Years since quitting: 33.7  Smokeless Tobacco Never    Labs: Review Flowsheet        No data to display          Capillary Blood Glucose: No results found for: "GLUCAP"   Pulmonary Assessment Scores:  Pulmonary Assessment Scores     Row Name 10/14/21 1525         ADL UCSD   ADL Phase Entry     SOB Score total 39       CAT Score   CAT Score 16       mMRC Score   mMRC Score 1              UCSD: Self-administered rating of dyspnea associated with activities of daily living (ADLs) 6-point scale (0 = "not at all" to 5 = "maximal or unable to do because of breathlessness")  Scoring Scores range from 0 to 120.  Minimally important difference is 5 units  CAT: CAT can identify the health impairment of COPD patients and is better correlated with disease progression.  CAT has a scoring range of zero to 40. The CAT score is classified into four groups of low (less than 10), medium (10 - 20), high (21-30) and very high (31-40) based on the impact level of disease on health status. A CAT score over 10 suggests significant symptoms.  A worsening CAT score could be explained by an exacerbation, poor medication adherence, poor inhaler technique, or progression of COPD or comorbid conditions.  CAT MCID is 2 points  mMRC: mMRC (Modified Medical Research Council) Dyspnea Scale is used to assess the degree of baseline functional disability in patients of respiratory disease due to dyspnea. No minimal important difference is established. A decrease in score of 1 point or greater is considered a positive change.   Pulmonary Function Assessment:  Pulmonary Function Assessment - 10/14/21 1336       Breath   Bilateral Breath Sounds Decreased    Shortness of Breath Yes;Limiting activity;Fear of Shortness of Breath             Exercise Target Goals: Exercise Program Goal: Individual exercise prescription set using results from initial 6 min walk test and THRR while considering  patient's activity barriers and safety.   Exercise Prescription Goal: Initial exercise prescription builds to 30-45 minutes a day of aerobic activity, 2-3 days per week.  Home exercise guidelines will be given to patient during program as part of exercise prescription that the participant will acknowledge.  Activity Barriers & Risk Stratification:  Activity Barriers & Cardiac Risk Stratification -  10/14/21 1415       Activity Barriers & Cardiac Risk Stratification   Activity Barriers Deconditioning;Muscular Weakness;Shortness of Breath;History of Falls   neuropathy in feet            6 Minute Walk:  6 Minute Walk     Row Name 10/14/21 1521         6 Minute Walk   Phase Initial     Distance 1227 feet     Walk Time 6 minutes     # of Rest Breaks 0     MPH  2.32     METS 1.85     RPE 13     Perceived Dyspnea  1     VO2 Peak 6.47     Symptoms No     Resting HR 64 bpm     Resting BP 122/60     Resting Oxygen Saturation  97 %     Exercise Oxygen Saturation  during 6 min walk 93 %     Max Ex. HR 79 bpm     Max Ex. BP 152/68     2 Minute Post BP 134/60       Interval HR   1 Minute HR 71     2 Minute HR 73     3 Minute HR 73     4 Minute HR 73     5 Minute HR 79     6 Minute HR 79     2 Minute Post HR 66     Interval Heart Rate? Yes       Interval Oxygen   Interval Oxygen? Yes     Baseline Oxygen Saturation % 97 %     1 Minute Oxygen Saturation % 91 %     1 Minute Liters of Oxygen 0 L     2 Minute Oxygen Saturation % 96 %     2 Minute Liters of Oxygen 0 L     3 Minute Oxygen Saturation % 94 %     3 Minute Liters of Oxygen 0 L     4 Minute Oxygen Saturation % 99 %     4 Minute Liters of Oxygen 0 L     5 Minute Oxygen Saturation % 95 %     5 Minute Liters of Oxygen 0 L     6 Minute Oxygen Saturation % 93 %     6 Minute Liters of Oxygen 0 L     2 Minute Post Oxygen Saturation % 95 %     2 Minute Post Liters of Oxygen 0 L              Oxygen Initial Assessment:  Oxygen Initial Assessment - 10/14/21 1418       Home Oxygen   Home Oxygen Device Home Concentrator    Sleep Oxygen Prescription Continuous    Liters per minute 2    Home Exercise Oxygen Prescription None    Home Resting Oxygen Prescription None    Compliance with Home Oxygen Use Yes      Initial 6 min Walk   Oxygen Used None      Program Oxygen Prescription   Program Oxygen  Prescription None      Intervention   Short Term Goals To learn and exhibit compliance with exercise, home and travel O2 prescription;To learn and understand importance of monitoring SPO2 with pulse oximeter and demonstrate accurate use of the pulse oximeter.;To learn and understand importance of maintaining oxygen saturations>88%;To learn and demonstrate proper pursed lip breathing techniques or other breathing techniques. ;To learn and demonstrate proper use of respiratory medications    Long  Term Goals Exhibits compliance with exercise, home  and travel O2 prescription;Verbalizes importance of monitoring SPO2 with pulse oximeter and return demonstration;Maintenance of O2 saturations>88%;Exhibits proper breathing techniques, such as pursed lip breathing or other method taught during program session;Compliance with respiratory medication;Demonstrates proper use of MDI's             Oxygen Re-Evaluation:  Oxygen Re-Evaluation     Row Name  10/19/21 1206 11/16/21 1200           Program Oxygen Prescription   Program Oxygen Prescription None None        Home Oxygen   Home Oxygen Device Home Concentrator Home Concentrator      Sleep Oxygen Prescription Continuous Continuous      Liters per minute 2 2      Home Exercise Oxygen Prescription None None      Home Resting Oxygen Prescription None None      Compliance with Home Oxygen Use Yes Yes        Goals/Expected Outcomes   Short Term Goals To learn and exhibit compliance with exercise, home and travel O2 prescription;To learn and understand importance of monitoring SPO2 with pulse oximeter and demonstrate accurate use of the pulse oximeter.;To learn and understand importance of maintaining oxygen saturations>88%;To learn and demonstrate proper pursed lip breathing techniques or other breathing techniques. ;To learn and demonstrate proper use of respiratory medications To learn and exhibit compliance with exercise, home and travel O2  prescription;To learn and understand importance of monitoring SPO2 with pulse oximeter and demonstrate accurate use of the pulse oximeter.;To learn and understand importance of maintaining oxygen saturations>88%;To learn and demonstrate proper pursed lip breathing techniques or other breathing techniques. ;To learn and demonstrate proper use of respiratory medications      Long  Term Goals Exhibits compliance with exercise, home  and travel O2 prescription;Verbalizes importance of monitoring SPO2 with pulse oximeter and return demonstration;Maintenance of O2 saturations>88%;Exhibits proper breathing techniques, such as pursed lip breathing or other method taught during program session;Compliance with respiratory medication;Demonstrates proper use of MDI's Exhibits compliance with exercise, home  and travel O2 prescription;Verbalizes importance of monitoring SPO2 with pulse oximeter and return demonstration;Maintenance of O2 saturations>88%;Exhibits proper breathing techniques, such as pursed lip breathing or other method taught during program session;Compliance with respiratory medication;Demonstrates proper use of MDI's      Goals/Expected Outcomes Compliance and understanding of oxygen saturation monitoring and breathing techniques to decrease shortness of breath. Compliance and understanding of oxygen saturation monitoring and breathing techniques to decrease shortness of breath.               Oxygen Discharge (Final Oxygen Re-Evaluation):  Oxygen Re-Evaluation - 11/16/21 1200       Program Oxygen Prescription   Program Oxygen Prescription None      Home Oxygen   Home Oxygen Device Home Concentrator    Sleep Oxygen Prescription Continuous    Liters per minute 2    Home Exercise Oxygen Prescription None    Home Resting Oxygen Prescription None    Compliance with Home Oxygen Use Yes      Goals/Expected Outcomes   Short Term Goals To learn and exhibit compliance with exercise, home and  travel O2 prescription;To learn and understand importance of monitoring SPO2 with pulse oximeter and demonstrate accurate use of the pulse oximeter.;To learn and understand importance of maintaining oxygen saturations>88%;To learn and demonstrate proper pursed lip breathing techniques or other breathing techniques. ;To learn and demonstrate proper use of respiratory medications    Long  Term Goals Exhibits compliance with exercise, home  and travel O2 prescription;Verbalizes importance of monitoring SPO2 with pulse oximeter and return demonstration;Maintenance of O2 saturations>88%;Exhibits proper breathing techniques, such as pursed lip breathing or other method taught during program session;Compliance with respiratory medication;Demonstrates proper use of MDI's    Goals/Expected Outcomes Compliance and understanding of oxygen saturation monitoring and breathing techniques to decrease shortness  of breath.             Initial Exercise Prescription:  Initial Exercise Prescription - 10/14/21 1500       Date of Initial Exercise RX and Referring Provider   Date 10/14/21    Referring Provider [name]    Expected Discharge Date 12/22/21      NuStep   Level 1    SPM 70    Minutes 15      Arm Ergometer   Level 1    Watts 5    RPM 40    Minutes 15      Prescription Details   Frequency (times per week) 2    Duration Progress to 30 minutes of continuous aerobic without signs/symptoms of physical distress      Intensity   THRR 40-80% of Max Heartrate 56-112    Ratings of Perceived Exertion 11-13    Perceived Dyspnea 0-4      Progression   Progression Continue to progress workloads to maintain intensity without signs/symptoms of physical distress.      Resistance Training   Training Prescription Yes    Weight blue bands    Reps 10-15             Perform Capillary Blood Glucose checks as needed.  Exercise Prescription Changes:   Exercise Prescription Changes     Row  Name 10/25/21 1200 11/08/21 1200 11/15/21 1200 11/22/21 1200       Response to Exercise   Blood Pressure (Admit) 120/58 144/72 -- 130/60    Blood Pressure (Exercise) 142/62 126/60 -- 144/80    Blood Pressure (Exit) 120/64 120/54 -- 96/56    Heart Rate (Admit) 78 bpm 81 bpm -- 73 bpm    Heart Rate (Exercise) 74 bpm 84 bpm -- 83 bpm    Heart Rate (Exit) 84 bpm 76 bpm -- 77 bpm    Oxygen Saturation (Admit) 92 % 95 % -- 96 %    Oxygen Saturation (Exercise) 93 % 92 % -- 95 %    Oxygen Saturation (Exit) 97 % 95 % -- 94 %    Rating of Perceived Exertion (Exercise) 11 9 -- 10    Perceived Dyspnea (Exercise) 0 1 -- 1    Duration Continue with 30 min of aerobic exercise without signs/symptoms of physical distress. Continue with 30 min of aerobic exercise without signs/symptoms of physical distress. -- Continue with 30 min of aerobic exercise without signs/symptoms of physical distress.    Intensity THRR unchanged THRR unchanged -- THRR unchanged      Progression   Progression Continue to progress workloads to maintain intensity without signs/symptoms of physical distress. Continue to progress workloads to maintain intensity without signs/symptoms of physical distress. -- Continue to progress workloads to maintain intensity without signs/symptoms of physical distress.      Resistance Training   Training Prescription Yes Yes -- Yes    Weight blue bands Blue bands -- Blue bands    Reps 10-15 10-15 -- 10-15    Time -- 10 Minutes -- 10 Minutes      NuStep   Level 1 4 -- 5    SPM 70 80 -- 80    Minutes 15 15 -- 15    METs 2.2 2.5 -- 3.2      Arm Ergometer   Level 1 3 -- 4    Minutes 15 15 -- 15    METs 1.8 2.7 -- 2.1      Home Exercise  Plan   Plans to continue exercise at -- -- [facility] (comment)  [place] --    Frequency -- -- Add 1 additional day to program exercise sessions. --    Initial Home Exercises Provided -- -- 11/15/21 --             Exercise Comments:    Exercise Comments     Row Name 10/20/21 1209 11/15/21 1155         Exercise Comments Pt completed first day of exercise. He exercised for 15 min on the arm ergometer and Nustep. Dick averaged 1.7 METs at level 1 on the arm ergometer and 1.9 METs at level 1 on the Nustep. Discussed METs and how to increase METs. He performed the warmup and cooldown without limitations. Completed Dick's home exercise plan. He is not currently doing cardiovascular exercise as he is gardening and doing yardwork. Discussed adding atleast one nonrehab day of 30 min of exercise. He plans to join the [employer] or [employer] soon to continue to exercise after rehab. He likes the machines here in rehab. Expect good compliance.               Exercise Goals and Review:   Exercise Goals     Row Name 10/14/21 1417 10/19/21 1204 11/16/21 1156         Exercise Goals   Increase Physical Activity Yes Yes Yes     Intervention Provide advice, education, support and counseling about physical activity/exercise needs. Provide advice, education, support and counseling about physical activity/exercise needs.;Develop an individualized exercise prescription for aerobic and resistive training based on initial evaluation findings, risk stratification, comorbidities and participant's personal goals. Provide advice, education, support and counseling about physical activity/exercise needs.;Develop an individualized exercise prescription for aerobic and resistive training based on initial evaluation findings, risk stratification, comorbidities and participant's personal goals.     Expected Outcomes Long Term: Add in home exercise to make exercise part of routine and to increase amount of physical activity.;Short Term: Attend rehab on a regular basis to increase amount of physical activity.;Long Term: Exercising regularly at least 3-5 days a week. Long Term: Add in home exercise to make exercise part of routine and to increase amount of  physical activity.;Short Term: Attend rehab on a regular basis to increase amount of physical activity.;Long Term: Exercising regularly at least 3-5 days a week. Long Term: Add in home exercise to make exercise part of routine and to increase amount of physical activity.;Short Term: Attend rehab on a regular basis to increase amount of physical activity.;Long Term: Exercising regularly at least 3-5 days a week.     Increase Strength and Stamina Yes Yes Yes     Intervention Provide advice, education, support and counseling about physical activity/exercise needs.;Develop an individualized exercise prescription for aerobic and resistive training based on initial evaluation findings, risk stratification, comorbidities and participant's personal goals. Provide advice, education, support and counseling about physical activity/exercise needs.;Develop an individualized exercise prescription for aerobic and resistive training based on initial evaluation findings, risk stratification, comorbidities and participant's personal goals. Provide advice, education, support and counseling about physical activity/exercise needs.;Develop an individualized exercise prescription for aerobic and resistive training based on initial evaluation findings, risk stratification, comorbidities and participant's personal goals.     Expected Outcomes Short Term: Increase workloads from initial exercise prescription for resistance, speed, and METs.;Short Term: Perform resistance training exercises routinely during rehab and add in resistance training at home;Long Term: Improve cardiorespiratory fitness, muscular endurance and strength  as measured by increased METs and functional capacity (6MWT) Short Term: Increase workloads from initial exercise prescription for resistance, speed, and METs.;Short Term: Perform resistance training exercises routinely during rehab and add in resistance training at home;Long Term: Improve cardiorespiratory fitness,  muscular endurance and strength as measured by increased METs and functional capacity (6MWT) Short Term: Increase workloads from initial exercise prescription for resistance, speed, and METs.;Short Term: Perform resistance training exercises routinely during rehab and add in resistance training at home;Long Term: Improve cardiorespiratory fitness, muscular endurance and strength as measured by increased METs and functional capacity (6MWT)     Able to understand and use rate of perceived exertion (RPE) scale Yes Yes Yes     Intervention Provide education and explanation on how to use RPE scale Provide education and explanation on how to use RPE scale Provide education and explanation on how to use RPE scale     Expected Outcomes Short Term: Able to use RPE daily in rehab to express subjective intensity level;Long Term:  Able to use RPE to guide intensity level when exercising independently Short Term: Able to use RPE daily in rehab to express subjective intensity level;Long Term:  Able to use RPE to guide intensity level when exercising independently Short Term: Able to use RPE daily in rehab to express subjective intensity level;Long Term:  Able to use RPE to guide intensity level when exercising independently     Able to understand and use Dyspnea scale Yes Yes Yes     Intervention -- Provide education and explanation on how to use Dyspnea scale Provide education and explanation on how to use Dyspnea scale     Expected Outcomes Long Term: Able to use Dyspnea scale to guide intensity level when exercising independently Long Term: Able to use Dyspnea scale to guide intensity level when exercising independently;Short Term: Able to use Dyspnea scale daily in rehab to express subjective sense of shortness of breath during exertion Long Term: Able to use Dyspnea scale to guide intensity level when exercising independently;Short Term: Able to use Dyspnea scale daily in rehab to express subjective sense of shortness  of breath during exertion     Knowledge and understanding of Target Heart Rate Range (THRR) Yes Yes Yes     Intervention Provide education and explanation of THRR including how the numbers were predicted and where they are located for reference Provide education and explanation of THRR including how the numbers were predicted and where they are located for reference Provide education and explanation of THRR including how the numbers were predicted and where they are located for reference     Expected Outcomes Short Term: Able to state/look up THRR;Long Term: Able to use THRR to govern intensity when exercising independently;Short Term: Able to use daily as guideline for intensity in rehab Short Term: Able to state/look up THRR;Long Term: Able to use THRR to govern intensity when exercising independently;Short Term: Able to use daily as guideline for intensity in rehab Short Term: Able to state/look up THRR;Long Term: Able to use THRR to govern intensity when exercising independently;Short Term: Able to use daily as guideline for intensity in rehab     Understanding of Exercise Prescription Yes Yes Yes     Intervention Provide education, explanation, and written materials on patient's individual exercise prescription Provide education, explanation, and written materials on patient's individual exercise prescription Provide education, explanation, and written materials on patient's individual exercise prescription     Expected Outcomes Short Term: Able to explain program exercise  prescription;Long Term: Able to explain home exercise prescription to exercise independently Short Term: Able to explain program exercise prescription;Long Term: Able to explain home exercise prescription to exercise independently Short Term: Able to explain program exercise prescription;Long Term: Able to explain home exercise prescription to exercise independently              Exercise Goals Re-Evaluation :  Exercise Goals  Re-Evaluation     Row Name 10/19/21 1204 11/16/21 1156           Exercise Goal Re-Evaluation   Exercise Goals Review Increase Physical Activity;Increase Strength and Stamina;Able to understand and use rate of perceived exertion (RPE) scale;Able to understand and use Dyspnea scale;Knowledge and understanding of Target Heart Rate Range (THRR);Understanding of Exercise Prescription Increase Physical Activity;Increase Strength and Stamina;Able to understand and use rate of perceived exertion (RPE) scale;Able to understand and use Dyspnea scale;Knowledge and understanding of Target Heart Rate Range (THRR);Understanding of Exercise Prescription      Comments Barbarann Ehlers is scheduled to begin exercise this week. Will continue to monitor and progress as able. Barbarann Ehlers has completed 7 exercise sessions. He exercises for 15 min on the arm ergometer and Nustep. Dick averages 2.5 METs at level 3.5 on the arm ergometer and 2.8 METs on the Nustep. Barbarann Ehlers has increased his workload for both exercise modes. He tolerates progressions well. Barbarann Ehlers is motivated to exercise and improve his functional capacity. He performs the warmup and cooldown standing without limitations. We recently discussed home exercise as he is not currently exercising at home. He was encouraged to start exercising outside of rehab. Will continue to monitor and progress as able.      Expected Outcomes Through exercise at rehab and home, the patient will decrease shortness of breath with daily activities and feel confident in carrying out an exercise regimen at home. Through exercise at rehab and home, the patient will decrease shortness of breath with daily activities and feel confident in carrying out an exercise regimen at home.               Discharge Exercise Prescription (Final Exercise Prescription Changes):  Exercise Prescription Changes - 11/22/21 1200       Response to Exercise   Blood Pressure (Admit) 130/60    Blood Pressure (Exercise)  144/80    Blood Pressure (Exit) 96/56    Heart Rate (Admit) 73 bpm    Heart Rate (Exercise) 83 bpm    Heart Rate (Exit) 77 bpm    Oxygen Saturation (Admit) 96 %    Oxygen Saturation (Exercise) 95 %    Oxygen Saturation (Exit) 94 %    Rating of Perceived Exertion (Exercise) 10    Perceived Dyspnea (Exercise) 1    Duration Continue with 30 min of aerobic exercise without signs/symptoms of physical distress.    Intensity THRR unchanged      Progression   Progression Continue to progress workloads to maintain intensity without signs/symptoms of physical distress.      Resistance Training   Training Prescription Yes    Weight Blue bands    Reps 10-15    Time 10 Minutes      NuStep   Level 5    SPM 80    Minutes 15    METs 3.2      Arm Ergometer   Level 4    Minutes 15    METs 2.1             Nutrition:  Target Goals: Understanding of  nutrition guidelines, daily intake of sodium '1500mg'$ , cholesterol '200mg'$ , calories 30% from fat and 7% or less from saturated fats, daily to have 5 or more servings of fruits and vegetables.  Biometrics:  Pre Biometrics - 10/14/21 1336       Pre Biometrics   Grip Strength 30 kg              Nutrition Therapy Plan and Nutrition Goals:  Nutrition Therapy & Goals - 11/17/21 1120       Nutrition Therapy   Diet Heart Healthy Diet    Drug/Food Interactions Statins/Certain Fruits      Personal Nutrition Goals   Nutrition Goal Patient to choose a variety of fruits, vegetables, whole grains, low fat dairy, lean protein/plant protein, as part of heart healthy lifestyle.    Personal Goal #2 Patient to limit to '1500mg'$  of sodium per day.    Personal Goal #3 Patient to identify and limit food sources of saturated fat, trans fat, sodium, and refined carbohydrates.    Comments Goals in progress. Patient reports eating a wide variety of foods and interest in cooking more; gave Pritikin cookbook as he has been interested in what is being  cooked/prepared for cardiac rehab patients. 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 Discussed high fiber food choices and mindfulness of saturated fat intake. Lipid panel WNL.      Intervention Plan   Intervention Prescribe, educate and counsel regarding individualized specific dietary modifications aiming towards targeted core components such as weight, hypertension, lipid management, diabetes, heart failure and other comorbidities.;Nutrition handout(s) given to patient.    Expected Outcomes Short Term Goal: Understand basic principles of dietary content, such as calories, fat, sodium, cholesterol and nutrients.;Long Term Goal: Adherence to prescribed nutrition plan.             Nutrition Assessments:  Nutrition Assessments - 10/20/21 1159       Rate Your Plate Scores   Pre Score 59            MEDIFICTS Score Key: ?70 Need to make dietary changes  40-70 Heart Healthy Diet ? 40 Therapeutic Level Cholesterol Diet  Flowsheet Row PULMONARY REHAB OTHER RESPIRATORY from 10/20/2021 in [facility]  Picture Your Plate Total Score on Admission 59      Picture Your Plate Scores: <67 Unhealthy dietary pattern with much room for improvement. 41-50 Dietary pattern unlikely to meet recommendations for good health and room for improvement. 51-60 More healthful dietary pattern, with some room for improvement.  >60 Healthy dietary pattern, although there may be some specific behaviors that could be improved.    Nutrition Goals Re-Evaluation:  Nutrition Goals Re-Evaluation     [place] Name 10/20/21 1118 11/17/21 1120           Goals   Current Weight 271 lb 13.2 oz (123.3 kg)  obese BMI 273 lb 2.4 oz (123.9 kg)      Comment Patient reports general decrease in portion sizes consistent with aging. He and his wife cook less at home and pick-up food often for dinner. Patient appears well nourished and he continues to eat three meals per day and enjoys 1-2  bourbon drinks daily. --      Expected Outcome Will continue to monitor intake and eating frequency to support advanced lifestage. Goals in progress. Patient reports eating a wide variety of foods and interest in cooking more; gave Pritikin cookbook as he has been interested in what is being cooked/prepared  for cardiac rehab patients. 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 Discussed high fiber food choices and mindfulness of saturated fat intake. Lipid panel WNL.               Nutrition Goals Discharge (Final Nutrition Goals Re-Evaluation):  Nutrition Goals Re-Evaluation - 11/17/21 1120       Goals   Current Weight 273 lb 2.4 oz (123.9 kg)    Expected Outcome Goals in progress. Patient reports eating a wide variety of foods and interest in cooking more; gave Pritikin cookbook as he has been interested in what is being cooked/prepared for cardiac rehab patients. 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 Discussed high fiber food choices and mindfulness of saturated fat intake. Lipid panel WNL.             Psychosocial: Target Goals: Acknowledge presence or absence of significant depression and/or stress, maximize coping skills, provide positive support system. Participant is able to verbalize types and ability to use techniques and skills needed for reducing stress and depression.  Initial Review & Psychosocial Screening:  Initial Psych Review & Screening - 10/14/21 1406       Initial Review   Current issues with None Identified      Family Dynamics   Good Support System? Yes    Comments spouse, children, and grandchildren      Barriers   Psychosocial barriers to participate in program There are no identifiable barriers or psychosocial needs.      Screening Interventions   Interventions Encouraged to exercise             Quality of Life Scores:  Scores of 19 and below usually indicate a poorer quality of life in these areas.  A difference of  2-3 points  is a clinically meaningful difference.  A difference of 2-3 points in the total score of the Quality of Life Index has been associated with significant improvement in overall quality of life, self-image, physical symptoms, and general health in studies assessing change in quality of life.  PHQ-9: Review Flowsheet       10/14/2021  Depression screen PHQ 2/9  Decreased Interest 0  Down, Depressed, Hopeless 0  PHQ - 2 Score 0  Altered sleeping 0  Tired, decreased energy 0  Change in appetite 0  Feeling bad or failure about yourself  0  Trouble concentrating 0  Moving slowly or fidgety/restless 0  Suicidal thoughts 0  PHQ-9 Score 0   Interpretation of Total Score  Total Score Depression Severity:  1-4 = Minimal depression, 5-9 = Mild depression, 10-14 = Moderate depression, 15-19 = Moderately severe depression, 20-27 = Severe depression   Psychosocial Evaluation and Intervention:  Psychosocial Evaluation - 10/14/21 1410       Psychosocial Evaluation & Interventions   Interventions Encouraged to exercise with the program and follow exercise prescription    Expected Outcomes For Dick to participate in Pulmonary Rehab without psychosocial concerns.    Continue Psychosocial Services  Follow up required by staff             Psychosocial Re-Evaluation:  Psychosocial Re-Evaluation     [place] Name 10/26/21 0848 11/16/21 1127           Psychosocial Re-Evaluation   Current issues with None Identified None Identified      Comments Barbarann Ehlers has attented 2 session  thus far he has had no psychosocial barriers. We will continue to monitor. Barbarann Ehlers has been participating in  the PR program without any psychosocial barriers. He seems to be enjoying communicating with his classmates and coming to the program.      Expected Outcomes For him to attend PR without psychosocial issues or concerns. For him to continue to attend PR without any psychosocial concerns or issues.      Interventions Encouraged  to attend Pulmonary Rehabilitation for the exercise Encouraged to attend Pulmonary Rehabilitation for the exercise      Continue Psychosocial Services  Follow up required by staff No Follow up required               Psychosocial Discharge (Final Psychosocial Re-Evaluation):  Psychosocial Re-Evaluation - 11/16/21 1127       Psychosocial Re-Evaluation   Current issues with None Identified    Comments Barbarann Ehlers has been participating in the PR program without any psychosocial barriers. He seems to be enjoying communicating with his classmates and coming to the program.    Expected Outcomes For him to continue to attend PR without any psychosocial concerns or issues.    Interventions Encouraged to attend Pulmonary Rehabilitation for the exercise    Continue Psychosocial Services  No Follow up required             Education: Education Goals: Education classes will be provided on a weekly basis, covering required topics. Participant will state understanding/return demonstration of topics presented.  Learning Barriers/Preferences:  Learning Barriers/Preferences - 10/14/21 1410       Learning Barriers/Preferences   Learning Barriers Sight   wears glasses   Learning Preferences Verbal Instruction;Written Material;Pictoral;Skilled Demonstration             Education Topics: Introduction to Pulmonary Rehab Group instruction provided by PowerPoint, verbal discussion, and written material to support subject matter. Instructor reviews what Pulmonary Rehab is, the purpose of the program, and how patients are referred.     Know Your Numbers Group instruction that is supported by a PowerPoint presentation. Instructor discusses importance of knowing and understanding resting, exercise, and post-exercise oxygen saturation, heart rate, and blood pressure. Oxygen saturation, heart rate, blood pressure, rating of perceived exertion, and dyspnea are reviewed along with a normal range for these  values.    Exercise for the Pulmonary Patient Group instruction that is supported by a PowerPoint presentation. Instructor discusses benefits of exercise, core components of exercise, frequency, duration, and intensity of an exercise routine, importance of utilizing pulse oximetry during exercise, safety while exercising, and options of places to exercise outside of rehab.  Flowsheet Row PULMONARY REHAB OTHER RESPIRATORY from 10/27/2021 in [place] [facility]  Date 10/20/21  Educator Donnetta Simpers  Instruction Review Code 1- Verbalizes Understanding          MET Level  Group instruction provided by PowerPoint, verbal discussion, and written material to support subject matter. Instructor reviews what METs are and how to increase METs.    Pulmonary Medications Verbally interactive group education provided by instructor with focus on inhaled medications and proper administration. Flowsheet Row PULMONARY REHAB OTHER RESPIRATORY from 11/17/2021 in [facility]  Date 11/10/21  Educator Donnetta Simpers  Instruction Review Code 2- Demonstrated Understanding       Anatomy and Physiology of the Respiratory System Group instruction provided by PowerPoint, verbal discussion, and written material to support subject matter. Instructor reviews respiratory cycle and anatomical components of the respiratory system and their functions. Instructor also reviews differences in obstructive and restrictive respiratory diseases with examples of each.  Flowsheet  Row PULMONARY REHAB OTHER RESPIRATORY from 11/17/2021 in [facility]  Date 11/17/21  Educator Donnetta Simpers  Instruction Review Code 2- Demonstrated Understanding       Oxygen Safety Group instruction provided by PowerPoint, verbal discussion, and written material to support subject matter. There is an overview of "What is Oxygen" and "Why do we need it".  Instructor also reviews how  to create a safe environment for oxygen use, the importance of using oxygen as prescribed, and the risks of noncompliance. There is a brief discussion on traveling with oxygen and resources the patient may utilize.   Oxygen Use Group instruction provided by PowerPoint, verbal discussion, and written material to discuss how supplemental oxygen is prescribed and different types of oxygen supply systems. Resources for more information are provided.    Breathing Techniques Group instruction that is supported by demonstration and informational handouts. Instructor discusses the benefits of pursed lip and diaphragmatic breathing and detailed demonstration on how to perform both.     Risk Factor Reduction Group instruction that is supported by a PowerPoint presentation. Instructor discusses the definition of a risk factor, different risk factors for pulmonary disease, and how the heart and lungs work together.   MD Day A group question and answer session with a medical doctor that allows participants to ask questions that relate to their pulmonary disease state.   Nutrition for the Pulmonary Patient Group instruction provided by PowerPoint slides, verbal discussion, and written materials to support subject matter. The instructor gives an explanation and review of healthy diet recommendations, which includes a discussion on weight management, recommendations for fruit and vegetable consumption, as well as protein, fluid, caffeine, fiber, sodium, sugar, and alcohol. Tips for eating when patients are short of breath are discussed. Flowsheet Row PULMONARY REHAB OTHER RESPIRATORY from 11/17/2021 in [facility]  Date 11/03/21  Educator RD  Instruction Review Code 1- Verbalizes Understanding        Other Education Group or individual verbal, written, or video instructions that support the educational goals of the pulmonary rehab program. Flowsheet Row PULMONARY REHAB  OTHER RESPIRATORY from 10/27/2021 in [place]  Date 10/27/21  Educator Thompson Grayer handout]  Instruction Review Code 1- Verbalizes Understanding        Knowledge Questionnaire Score:  Knowledge Questionnaire Score - 10/14/21 1525       Knowledge Questionnaire Score   Pre Score 15/18             Core Components/Risk Factors/Patient Goals at Admission:  Personal Goals and Risk Factors at Admission - 10/14/21 1411       Core Components/Risk Factors/Patient Goals on Admission    Weight Management Weight Loss    Improve shortness of breath with ADL's Yes    Intervention Provide education, individualized exercise plan and daily activity instruction to help decrease symptoms of SOB with activities of daily living.    Expected Outcomes Short Term: Improve cardiorespiratory fitness to achieve a reduction of symptoms when performing ADLs;Long Term: Be able to perform more ADLs without symptoms or delay the onset of symptoms             Core Components/Risk Factors/Patient Goals Review:   Goals and Risk Factor Review     Row Name 10/26/21 0850 11/16/21 1136           Core Components/Risk Factors/Patient Goals Review   Personal Goals Review Other;Improve shortness of breath with ADL's;Develop more efficient  breathing techniques such as purse lipped breathing and diaphragmatic breathing and practicing self-pacing with activity. Weight Management/Obesity;Improve shortness of breath with ADL's;Develop more efficient breathing techniques such as purse lipped breathing and diaphragmatic breathing and practicing self-pacing with activity.      Review Barbarann Ehlers has attended 2 PR sessions.Tthus far he has tolerated them well.. He has exercised on the arm egometer and nu step without any issues.We will continue to monitor core components. Barbarann Ehlers has been exercising on the nustep and using the arm egometer. He has been increasing his workloads and METS.His weight  has been stable no real loss or gain.He states that his SOB has not improved. I reminded him that we could not change or cure his disease process.What he needs to improve his SOB is to learn and use breathing techniques.He voices understanding.      Expected Outcomes See admission goals. See admission goals               Core Components/Risk Factors/Patient Goals at Discharge (Final Review):   Goals and Risk Factor Review - 11/16/21 1136       Core Components/Risk Factors/Patient Goals Review   Personal Goals Review Weight Management/Obesity;Improve shortness of breath with ADL's;Develop more efficient breathing techniques such as purse lipped breathing and diaphragmatic breathing and practicing self-pacing with activity.    Review Barbarann Ehlers has been exercising on the nustep and using the arm egometer. He has been increasing his workloads and METS.His weight has been stable no real loss or gain.He states that his SOB has not improved. I reminded him that we could not change or cure his disease process.What he needs to improve his SOB is to learn and use breathing techniques.He voices understanding.    Expected Outcomes See admission goals             ITP Comments: ITP REVIEW Pt is making expected progress toward pulmonary rehab goals after completing 9 sessions. Recommend continued exercise, life style modification, education, and utilization of breathing techniques to increase stamina and strength and decrease shortness of breath with exertion.    Comments:  Dr. Rodman Pickle is Medical Director for Pulmonary Rehab at [facility]

## 2021-11-24 ENCOUNTER — Ambulatory Visit (HOSPITAL_COMMUNITY): Payer: PPO

## 2021-11-24 ENCOUNTER — Encounter (HOSPITAL_COMMUNITY)
Admission: RE | Admit: 2021-11-24 | Discharge: 2021-11-24 | Disposition: A | Discharge status: Home / Self Care | Payer: PPO | Source: Ambulatory Visit | Attending: Internal Medicine | Admitting: Internal Medicine

## 2021-11-24 DIAGNOSIS — J479 Bronchiectasis, uncomplicated: Secondary | ICD-10-CM

## 2021-11-24 NOTE — Progress Notes (Signed)
Daily Session Note  Patient Details  Name: Noah Huffman MRN: [id number] Date of Birth: 07-Sep-1940 Referring Provider:   April Manson Pulmonary Rehab Walk Test from 10/14/2021 in [facility]  Referring Provider [name]       Encounter Date: 11/24/2021  Check In:  Session Check In - 11/24/21 1122       Check-In   Supervising physician immediately available to respond to emergencies [facility] immediately available    Physician(s) Dr. Cyndia Skeeters    Location [facility]    Staff Present Rosebud Poles, RN, BSN;Randi Olen Cordial BS, ACSM-CEP, Exercise Physiologist;Kaylee Rosana Hoes, MS, ACSM-CEP, Exercise Physiologist;Darria Corvera Ysidro Evert, RN    Virtual Visit No    Medication changes reported     No    Fall or balance concerns reported    No    Tobacco Cessation No Change    Warm-up and Cool-down Performed as group-led instruction    Resistance Training Performed Yes    VAD Patient? No    PAD/SET Patient? No      Pain Assessment   Currently in Pain? No/denies    Multiple Pain Sites No             Capillary Blood Glucose: No results found for this or any previous visit (from the past 24 hour(s)).    Social History   Tobacco Use  Smoking Status Former   Packs/day: 2.50   Years: 25.00   Total pack years: 62.50   Types: Cigarettes   Quit date: 03/20/1988   Years since quitting: 33.7  Smokeless Tobacco Never    Goals Met:  Proper associated with RPD/PD & O2 Sat Exercise tolerated well No report of concerns or symptoms today Strength training completed today  Goals Unmet:  Not Applicable  Comments: Service time is from [place] to [place]    Dr. Rodman Pickle is Medical Director for Pulmonary Rehab at [facility]

## 2021-11-29 ENCOUNTER — Ambulatory Visit (HOSPITAL_COMMUNITY): Payer: PPO

## 2021-11-29 ENCOUNTER — Encounter (HOSPITAL_COMMUNITY)
Admission: RE | Admit: 2021-11-29 | Discharge: 2021-11-29 | Disposition: A | Discharge status: Home / Self Care | Payer: PPO | Source: Ambulatory Visit | Attending: Internal Medicine | Admitting: Internal Medicine

## 2021-11-29 DIAGNOSIS — J479 Bronchiectasis, uncomplicated: Secondary | ICD-10-CM | POA: Diagnosis not present

## 2021-11-29 NOTE — Progress Notes (Signed)
Daily Session Note  Patient Details  Name: Noah Huffman MRN: [id number] Date of Birth: 10/09/1940 Referring Provider:   Flowsheet Row Pulmonary Rehab Walk Test from 10/14/2021 in [facility]  Referring Provider [name]       Encounter Date: 11/29/2021  Check In:  Session Check In - 11/29/21 1121       Check-In   Supervising physician immediately available to respond to emergencies [facility] immediately available    Physician(s) Dr. Dan Smith    Location [facility]    Staff Present Joan Behrens, RN, BSN;Carlette Carlton, RN, BSN;Randi Reeve BS, ACSM-CEP, Exercise Physiologist;Kaylee Davis, MS, ACSM-CEP, Exercise Physiologist    Virtual Visit No    Medication changes reported     No    Fall or balance concerns reported    No    Tobacco Cessation No Change    Warm-up and Cool-down Performed as group-led instruction    Resistance Training Performed Yes    VAD Patient? No    PAD/SET Patient? No      Pain Assessment   Currently in Pain? No/denies    Multiple Pain Sites No             Capillary Blood Glucose: No results found for this or any previous visit (from the past 24 hour(s)).    Social History   Tobacco Use  Smoking Status Former   Packs/day: 2.50   Years: 25.00   Total pack years: 62.50   Types: Cigarettes   Quit date: 03/20/1988   Years since quitting: 33.7  Smokeless Tobacco Never    Goals Met:  Proper associated with RPD/PD & O2 Sat Exercise tolerated well No report of concerns or symptoms today Strength training completed today  Goals Unmet:  Not Applicable  Comments: Service time is from 1015 to 1132    Dr. Jane Ellison is Medical Director for Pulmonary Rehab at [facility].  

## 2021-12-01 ENCOUNTER — Ambulatory Visit (HOSPITAL_COMMUNITY): Payer: PPO

## 2021-12-01 ENCOUNTER — Encounter (HOSPITAL_COMMUNITY): Payer: PPO

## 2021-12-01 DIAGNOSIS — I2721 Secondary pulmonary arterial hypertension: Secondary | ICD-10-CM | POA: Diagnosis not present

## 2021-12-01 DIAGNOSIS — I10 Essential (primary) hypertension: Secondary | ICD-10-CM | POA: Diagnosis not present

## 2021-12-01 DIAGNOSIS — G4733 Obstructive sleep apnea (adult) (pediatric): Secondary | ICD-10-CM | POA: Diagnosis not present

## 2021-12-01 DIAGNOSIS — D692 Other nonthrombocytopenic purpura: Secondary | ICD-10-CM | POA: Diagnosis not present

## 2021-12-01 DIAGNOSIS — N401 Enlarged prostate with lower urinary tract symptoms: Secondary | ICD-10-CM | POA: Diagnosis not present

## 2021-12-01 DIAGNOSIS — E785 Hyperlipidemia, unspecified: Secondary | ICD-10-CM | POA: Diagnosis not present

## 2021-12-01 DIAGNOSIS — I4892 Unspecified atrial flutter: Secondary | ICD-10-CM | POA: Diagnosis not present

## 2021-12-01 DIAGNOSIS — J449 Chronic obstructive pulmonary disease, unspecified: Secondary | ICD-10-CM | POA: Diagnosis not present

## 2021-12-01 DIAGNOSIS — I77810 Thoracic aortic ectasia: Secondary | ICD-10-CM | POA: Diagnosis not present

## 2021-12-01 DIAGNOSIS — G609 Hereditary and idiopathic neuropathy, unspecified: Secondary | ICD-10-CM | POA: Diagnosis not present

## 2021-12-01 DIAGNOSIS — R739 Hyperglycemia, unspecified: Secondary | ICD-10-CM | POA: Diagnosis not present

## 2021-12-02 DIAGNOSIS — H04123 Dry eye syndrome of bilateral lacrimal glands: Secondary | ICD-10-CM | POA: Diagnosis not present

## 2021-12-02 DIAGNOSIS — H0100B Unspecified blepharitis left eye, upper and lower eyelids: Secondary | ICD-10-CM | POA: Diagnosis not present

## 2021-12-02 DIAGNOSIS — H353131 Nonexudative age-related macular degeneration, bilateral, early dry stage: Secondary | ICD-10-CM | POA: Diagnosis not present

## 2021-12-02 DIAGNOSIS — H02831 Dermatochalasis of right upper eyelid: Secondary | ICD-10-CM | POA: Diagnosis not present

## 2021-12-02 DIAGNOSIS — H04213 Epiphora due to excess lacrimation, bilateral lacrimal glands: Secondary | ICD-10-CM | POA: Diagnosis not present

## 2021-12-02 DIAGNOSIS — H0100A Unspecified blepharitis right eye, upper and lower eyelids: Secondary | ICD-10-CM | POA: Diagnosis not present

## 2021-12-02 DIAGNOSIS — H52203 Unspecified astigmatism, bilateral: Secondary | ICD-10-CM | POA: Diagnosis not present

## 2021-12-02 DIAGNOSIS — H02834 Dermatochalasis of left upper eyelid: Secondary | ICD-10-CM | POA: Diagnosis not present

## 2021-12-02 DIAGNOSIS — D3131 Benign neoplasm of right choroid: Secondary | ICD-10-CM | POA: Diagnosis not present

## 2021-12-06 ENCOUNTER — Encounter (HOSPITAL_COMMUNITY)
Admission: RE | Admit: 2021-12-06 | Discharge: 2021-12-06 | Disposition: A | Discharge status: Home / Self Care | Payer: PPO | Source: Ambulatory Visit | Attending: Internal Medicine | Admitting: Internal Medicine

## 2021-12-06 VITALS — Wt 275.6 lb

## 2021-12-06 DIAGNOSIS — J479 Bronchiectasis, uncomplicated: Secondary | ICD-10-CM | POA: Diagnosis not present

## 2021-12-07 NOTE — Progress Notes (Signed)
[facility] Summary Report / Loop Recorder 

## 2021-12-08 ENCOUNTER — Encounter (HOSPITAL_COMMUNITY)
Admission: RE | Admit: 2021-12-08 | Discharge: 2021-12-08 | Disposition: A | Discharge status: Home / Self Care | Payer: PPO | Source: Ambulatory Visit | Attending: Internal Medicine | Admitting: Internal Medicine

## 2021-12-08 DIAGNOSIS — J479 Bronchiectasis, uncomplicated: Secondary | ICD-10-CM | POA: Diagnosis not present

## 2021-12-08 NOTE — Progress Notes (Signed)
Daily Session Note  Patient Details  Name: Noah Huffman MRN: [id number] Date of Birth: 25-Apr-1940 Referring Provider:   April Manson Pulmonary Rehab Walk Test from 10/14/2021 in [place]  Referring Provider [name]       Encounter Date: 12/08/2021  Check In:  Session Check In - 12/08/21 1138       Check-In   Supervising physician immediately available to respond to emergencies [facility] - Physician supervision    Physician(s) Erskine Emery    [facility]    Staff Present Maurice Small, RN, BSN;Ramon Dredge, RN, MHA;Samantha [place], RD, Philip Aspen, MS, ACSM-CEP, Exercise Physiologist;Randi Yevonne Pax, ACSM-CEP, Exercise Physiologist;Jetta Gilford Rile BS, ACSM-CEP, Exercise Physiologist;Bailey Pearline Cables, MS, Exercise Physiologist    Virtual Visit No    Medication changes reported     No    Fall or balance concerns reported    No    Tobacco Cessation No Change    Warm-up and Cool-down Performed as group-led instruction    Resistance Training Performed Yes    VAD Patient? No    PAD/SET Patient? No      Pain Assessment   Currently in Pain? No/denies             Capillary Blood Glucose: No results found for this or any previous visit (from the past 24 hour(s)).    Social History   Tobacco Use  Smoking Status Former   Packs/day: 2.50   Years: 25.00   Total pack years: 62.50   Types: Cigarettes   Quit date: 03/20/1988   Years since quitting: 33.7  Smokeless Tobacco Never    Goals Met:  Proper associated with RPD/PD & O2 Sat Exercise tolerated well No report of concerns or symptoms today Strength training completed today  Goals Unmet:  Not Applicable  Comments: Service time is from 1005 to 1140.    Dr. Rodman Pickle is Medical Director for Pulmonary Rehab at [facility].

## 2021-12-13 ENCOUNTER — Encounter (HOSPITAL_COMMUNITY)
Admission: RE | Admit: 2021-12-13 | Discharge: 2021-12-13 | Disposition: A | Discharge status: Home / Self Care | Payer: PPO | Source: Ambulatory Visit | Attending: Internal Medicine | Admitting: Internal Medicine

## 2021-12-13 DIAGNOSIS — J479 Bronchiectasis, uncomplicated: Secondary | ICD-10-CM | POA: Diagnosis not present

## 2021-12-13 NOTE — Progress Notes (Signed)
Daily Session Note  Patient Details  Name: Noah Huffman MRN: [id number] Date of Birth: 28-May-1940 Referring Provider:   April Manson Pulmonary Rehab Walk Test from 10/14/2021 in [place]  Referring Provider [name]       Encounter Date: 12/13/2021  Check In:  Session Check In - 12/13/21 1134       Check-In   Supervising physician immediately available to respond to emergencies [facility] - Physician supervision    Physician(s) Dr, Ernest Mallick    Location [facility]    Staff Present Elmon Else, MS, ACSM-CEP, Exercise Physiologist;Juddson Cobern Yevonne Pax, ACSM-CEP, Exercise Physiologist;Lisa Ysidro Evert, RN    Virtual Visit No    Medication changes reported     No    Fall or balance concerns reported    No    Tobacco Cessation No Change    Warm-up and Cool-down Performed as group-led instruction    Resistance Training Performed Yes    VAD Patient? No    PAD/SET Patient? No      Pain Assessment   Currently in Pain? No/denies    Multiple Pain Sites No             Capillary Blood Glucose: No results found for this or any previous visit (from the past 24 hour(s)).    Social History   Tobacco Use  Smoking Status Former   Packs/day: 2.50   Years: 25.00   Total pack years: 62.50   Types: Cigarettes   Quit date: 03/20/1988   Years since quitting: 33.7  Smokeless Tobacco Never    Goals Met:  Independence with exercise equipment Using PLB without cueing & demonstrates good technique Exercise tolerated well No report of concerns or symptoms today Strength training completed today  Goals Unmet:  Not Applicable  Comments: Service time is from 1003 to 1135.    Dr. Rodman Pickle is Medical Director for Pulmonary Rehab at [facility].

## 2021-12-14 NOTE — Progress Notes (Signed)
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 Swallow whole., Disp: 90 tablet, Rfl: 3   azelastine (ASTELIN) 0.1 % nasal spray, Place 2 sprays into both nostrils 2 (two) times daily. Use in each nostril as directed, Disp: 30 mL, Rfl: 3   benzonatate (TESSALON) 200 MG capsule, Take 1 capsule (200 mg total) by mouth 3 (three) times daily as needed for cough. (Patient not taking: Reported on 10/20/2021), Disp: 30 capsule, Rfl: 0   BREZTRI AEROSPHERE 160-9-4.8 MCG/ACT AERO, INHALE 2 PUFFS BY MOUTH INTO THE LUNGS IN THE MORNING AND AT BEDTIME, Disp: 10.7 g, Rfl: 5   fluticasone (FLONASE) 50 MCG/ACT nasal spray, Place 1 spray into both nostrils daily., Disp: , Rfl:    gabapentin (NEURONTIN) 600 MG tablet, Take 1,800 mg by mouth 3 (three) times daily., Disp: , Rfl:    guaiFENesin (MUCINEX) 600 MG 12 hr tablet, Take 1 tablet (600 mg total) by mouth 2 (two) times daily., Disp: 60 tablet, Rfl: 3   levocetirizine (XYZAL) 5 MG tablet, Take 1 tablet (5 mg total) by mouth every evening. (Patient not taking: Reported on 10/20/2021), Disp: 90 tablet, Rfl: 1   losartan-hydrochlorothiazide (HYZAAR) 100-25 MG tablet, Take 1 tablet by mouth daily., Disp: , Rfl:     montelukast (SINGULAIR) 10 MG tablet, Take 1 tablet (10 mg total) by mouth at bedtime., Disp: 30 tablet, Rfl: 5   Multiple Vitamins-Minerals (PRESERVISION AREDS 2) CAPS, Take 1 capsule by mouth daily. , Disp: , Rfl:    polyvinyl alcohol (LIQUIFILM TEARS) 1.4 % ophthalmic solution, Place 1 drop into both eyes 2 (two) times daily., Disp: , Rfl:    rosuvastatin (CRESTOR) 10 MG tablet, Take 10 mg by mouth at bedtime., Disp: , Rfl:    sodium chloride (OCEAN) 0.65 % SOLN nasal spray, Place 1 spray into both nostrils daily., Disp: , Rfl:    tamsulosin (FLOMAX) 0.4 MG CAPS capsule, Take 0.4 mg by mouth daily., Disp: , Rfl:   Current Facility-Administered Medications:    0.9 %  sodium chloride infusion, 500 mL, Intravenous, Once, Irene Shipper, MD  Past Medical History: Past Medical History:  Diagnosis Date   Allergy    seasonal   Asthma    Atrial fibrillation ([place])    BPH (benign prostatic hypertrophy) with urinary obstruction    Cataract    removed both eyes   COPD (chronic obstructive pulmonary disease) (HCC)    Diverticulosis    Dysrhythmia    GERD (gastroesophageal reflux disease)    Hyperglycemia    Hyperlipidemia    Hypertension    Inguinal hernia    Neuromuscular disorder (HCC)    neuropathy in feet    Obesity    OSA (obstructive sleep  apnea) 06/06/2013   Osteoarthritis    Sleep apnea    no cpap    Tobacco Use: Social History   Tobacco Use  Smoking Status Former   Packs/day: 2.50   Years: 25.00   Total pack years: 62.50   Types: Cigarettes   Quit date: 03/20/1988   Years since quitting: 33.7  Smokeless Tobacco Never    Labs: Review Flowsheet        No data to display          Capillary Blood Glucose: No results found for: "GLUCAP"   Pulmonary Assessment Scores:  Pulmonary Assessment Scores     Row Name 10/14/21 1525 12/08/21 1531       ADL UCSD   ADL Phase Entry Exit    SOB Score total 39 29      CAT Score   CAT Score 16 22      mMRC Score    mMRC Score 1 --            UCSD: Self-administered rating of dyspnea associated with activities of daily living (ADLs) 6-point scale (0 = "not at all" to 5 = "maximal or unable to do because of breathlessness")  Scoring Scores range from 0 to 120.  Minimally important difference is 5 units  CAT: CAT can identify the health impairment of COPD patients and is better correlated with disease progression.  CAT has a scoring range of zero to 40. The CAT score is classified into four groups of low (less than 10), medium (10 - 20), high (21-30) and very high (31-40) based on the impact level of disease on health status. A CAT score over 10 suggests significant symptoms.  A worsening CAT score could be explained by an exacerbation, poor medication adherence, poor inhaler technique, or progression of COPD or comorbid conditions.  CAT MCID is 2 points  mMRC: mMRC (Modified Medical Research Council) Dyspnea Scale is used to assess the degree of baseline functional disability in patients of respiratory disease due to dyspnea. No minimal important difference is established. A decrease in score of 1 point or greater is considered a positive change.   Pulmonary Function Assessment:  Pulmonary Function Assessment - 10/14/21 1336       Breath   Bilateral Breath Sounds Decreased    Shortness of Breath Yes;Limiting activity;Fear of Shortness of Breath             Exercise Target Goals: Exercise Program Goal: Individual exercise prescription set using results from initial 6 min walk test and THRR while considering  patient's activity barriers and safety.   Exercise Prescription Goal: Initial exercise prescription builds to 30-45 minutes a day of aerobic activity, 2-3 days per week.  Home exercise guidelines will be given to patient during program as part of exercise prescription that the participant will acknowledge.  Activity Barriers & Risk Stratification:  Activity Barriers & Cardiac Risk  Stratification - 10/14/21 1415       Activity Barriers & Cardiac Risk Stratification   Activity Barriers Deconditioning;Muscular Weakness;Shortness of Breath;History of Falls   neuropathy in feet            6 Minute Walk:  6 Minute Walk     Row Name 10/14/21 1521         6 Minute Walk   Phase Initial     Distance 1227 feet     Walk Time 6 minutes     # of Rest Breaks 0     MPH  2.32     METS 1.85     RPE 13     Perceived Dyspnea  1     VO2 Peak 6.47     Symptoms No     Resting HR 64 bpm     Resting BP 122/60     Resting Oxygen Saturation  97 %     Exercise Oxygen Saturation  during 6 min walk 93 %     Max Ex. HR 79 bpm     Max Ex. BP 152/68     2 Minute Post BP 134/60       Interval HR   1 Minute HR 71     2 Minute HR 73     3 Minute HR 73     4 Minute HR 73     5 Minute HR 79     6 Minute HR 79     2 Minute Post HR 66     Interval Heart Rate? Yes       Interval Oxygen   Interval Oxygen? Yes     Baseline Oxygen Saturation % 97 %     1 Minute Oxygen Saturation % 91 %     1 Minute Liters of Oxygen 0 L     2 Minute Oxygen Saturation % 96 %     2 Minute Liters of Oxygen 0 L     3 Minute Oxygen Saturation % 94 %     3 Minute Liters of Oxygen 0 L     4 Minute Oxygen Saturation % 99 %     4 Minute Liters of Oxygen 0 L     5 Minute Oxygen Saturation % 95 %     5 Minute Liters of Oxygen 0 L     6 Minute Oxygen Saturation % 93 %     6 Minute Liters of Oxygen 0 L     2 Minute Post Oxygen Saturation % 95 %     2 Minute Post Liters of Oxygen 0 L              Oxygen Initial Assessment:  Oxygen Initial Assessment - 10/14/21 1418       Home Oxygen   Home Oxygen Device Home Concentrator    Sleep Oxygen Prescription Continuous    Liters per minute 2    Home Exercise Oxygen Prescription None    Home Resting Oxygen Prescription None    Compliance with Home Oxygen Use Yes      Initial 6 min Walk   Oxygen Used None      Program Oxygen Prescription    Program Oxygen Prescription None      Intervention   Short Term Goals To learn and exhibit compliance with exercise, home and travel O2 prescription;To learn and understand importance of monitoring SPO2 with pulse oximeter and demonstrate accurate use of the pulse oximeter.;To learn and understand importance of maintaining oxygen saturations>88%;To learn and demonstrate proper pursed lip breathing techniques or other breathing techniques. ;To learn and demonstrate proper use of respiratory medications    Long  Term Goals Exhibits compliance with exercise, home  and travel O2 prescription;Verbalizes importance of monitoring SPO2 with pulse oximeter and return demonstration;Maintenance of O2 saturations>88%;Exhibits proper breathing techniques, such as pursed lip breathing or other method taught during program session;Compliance with respiratory medication;Demonstrates proper use of MDI's             Oxygen Re-Evaluation:  Oxygen Re-Evaluation     Row Name  10/19/21 1206 11/16/21 1200 12/12/21 0832         Program Oxygen Prescription   Program Oxygen Prescription None None None       Home Oxygen   Home Oxygen Device Home Concentrator Home Concentrator Home Concentrator     Sleep Oxygen Prescription Continuous Continuous Continuous     Liters per minute _0 Home Exercise Oxygen Prescription None None None     Home Resting Oxygen Prescription None None None     Compliance with Home Oxygen Use Yes Yes Yes       Goals/Expected Outcomes   Short Term Goals To learn and exhibit compliance with exercise, home and travel O2 prescription;To learn and understand importance of monitoring SPO2 with pulse oximeter and demonstrate accurate use of the pulse oximeter.;To learn and understand importance of maintaining oxygen saturations>88%;To learn and demonstrate proper pursed lip breathing techniques or other breathing techniques. ;To learn and demonstrate proper use of respiratory medications To  learn and exhibit compliance with exercise, home and travel O2 prescription;To learn and understand importance of monitoring SPO2 with pulse oximeter and demonstrate accurate use of the pulse oximeter.;To learn and understand importance of maintaining oxygen saturations>88%;To learn and demonstrate proper pursed lip breathing techniques or other breathing techniques. ;To learn and demonstrate proper use of respiratory medications To learn and exhibit compliance with exercise, home and travel O2 prescription;To learn and understand importance of monitoring SPO2 with pulse oximeter and demonstrate accurate use of the pulse oximeter.;To learn and understand importance of maintaining oxygen saturations>88%;To learn and demonstrate proper pursed lip breathing techniques or other breathing techniques. ;To learn and demonstrate proper use of respiratory medications     Long  Term Goals Exhibits compliance with exercise, home  and travel O2 prescription;Verbalizes importance of monitoring SPO2 with pulse oximeter and return demonstration;Maintenance of O2 saturations>88%;Exhibits proper breathing techniques, such as pursed lip breathing or other method taught during program session;Compliance with respiratory medication;Demonstrates proper use of MDI's Exhibits compliance with exercise, home  and travel O2 prescription;Verbalizes importance of monitoring SPO2 with pulse oximeter and return demonstration;Maintenance of O2 saturations>88%;Exhibits proper breathing techniques, such as pursed lip breathing or other method taught during program session;Compliance with respiratory medication;Demonstrates proper use of MDI's Exhibits compliance with exercise, home  and travel O2 prescription;Verbalizes importance of monitoring SPO2 with pulse oximeter and return demonstration;Maintenance of O2 saturations>88%;Exhibits proper breathing techniques, such as pursed lip breathing or other method taught during program  session;Compliance with respiratory medication;Demonstrates proper use of MDI's     Goals/Expected Outcomes Compliance and understanding of oxygen saturation monitoring and breathing techniques to decrease shortness of breath. Compliance and understanding of oxygen saturation monitoring and breathing techniques to decrease shortness of breath. Compliance and understanding of oxygen saturation monitoring and breathing techniques to decrease shortness of breath.              Oxygen Discharge (Final Oxygen Re-Evaluation):  Oxygen Re-Evaluation - 12/12/21 0832       Program Oxygen Prescription   Program Oxygen Prescription None      Home Oxygen   Home Oxygen Device Home Concentrator    Sleep Oxygen Prescription Continuous    Liters per minute 2    Home Exercise Oxygen Prescription None    Home Resting Oxygen Prescription None    Compliance with Home Oxygen Use Yes      Goals/Expected Outcomes   Short Term Goals To learn and exhibit compliance with exercise, home and travel O2 prescription;To  learn and understand importance of monitoring SPO2 with pulse oximeter and demonstrate accurate use of the pulse oximeter.;To learn and understand importance of maintaining oxygen saturations>88%;To learn and demonstrate proper pursed lip breathing techniques or other breathing techniques. ;To learn and demonstrate proper use of respiratory medications    Long  Term Goals Exhibits compliance with exercise, home  and travel O2 prescription;Verbalizes importance of monitoring SPO2 with pulse oximeter and return demonstration;Maintenance of O2 saturations>88%;Exhibits proper breathing techniques, such as pursed lip breathing or other method taught during program session;Compliance with respiratory medication;Demonstrates proper use of MDI's    Goals/Expected Outcomes Compliance and understanding of oxygen saturation monitoring and breathing techniques to decrease shortness of breath.              Initial Exercise Prescription:  Initial Exercise Prescription - 10/14/21 1500       Date of Initial Exercise RX and Referring Provider   Date 10/14/21    Referring Provider [name]    Expected Discharge Date 12/22/21      NuStep   Level 1    SPM 70    Minutes 15      Arm Ergometer   Level 1    Watts 5    RPM 40    Minutes 15      Prescription Details   Frequency (times per week) 2    Duration Progress to 30 minutes of continuous aerobic without signs/symptoms of physical distress      Intensity   THRR 40-80% of Max Heartrate 56-112    Ratings of Perceived Exertion 11-13    Perceived Dyspnea 0-4      Progression   Progression Continue to progress workloads to maintain intensity without signs/symptoms of physical distress.      Resistance Training   Training Prescription Yes    Weight blue bands    Reps 10-15             Perform Capillary Blood Glucose checks as needed.  Exercise Prescription Changes:   Exercise Prescription Changes     Row Name 10/25/21 1200 11/08/21 1200 11/15/21 1200 11/22/21 1200 12/06/21 1200     Response to Exercise   Blood Pressure (Admit) 120/58 144/72 -- 130/60 122/60   Blood Pressure (Exercise) 142/62 126/60 -- 144/80 126/62   Blood Pressure (Exit) 120/64 120/54 -- 96/56 120/64   Heart Rate (Admit) 78 bpm 81 bpm -- 73 bpm 66 bpm   Heart Rate (Exercise) 74 bpm 84 bpm -- 83 bpm 77 bpm   Heart Rate (Exit) 84 bpm 76 bpm -- 77 bpm 70 bpm   Oxygen Saturation (Admit) 92 % 95 % -- 96 % 94 %   Oxygen Saturation (Exercise) 93 % 92 % -- 95 % 94 %   Oxygen Saturation (Exit) 97 % 95 % -- 94 % 95 %   Rating of Perceived Exertion (Exercise) 11 9 -- 10 8   Perceived Dyspnea (Exercise) 0 1 -- 1 1   Duration Continue with 30 min of aerobic exercise without signs/symptoms of physical distress. Continue with 30 min of aerobic exercise without signs/symptoms of physical distress. -- Continue with 30 min of aerobic exercise without  signs/symptoms of physical distress. Continue with 30 min of aerobic exercise without signs/symptoms of physical distress.   Intensity THRR unchanged THRR unchanged -- THRR unchanged THRR unchanged     Progression   Progression Continue to progress workloads to maintain intensity without signs/symptoms of physical distress. Continue to progress workloads to maintain intensity  without signs/symptoms of physical distress. -- Continue to progress workloads to maintain intensity without signs/symptoms of physical distress. Continue to progress workloads to maintain intensity without signs/symptoms of physical distress.     Resistance Training   Training Prescription Yes Yes -- Yes Yes   Weight blue bands Blue bands -- Blue bands Blue bands   Reps 10-15 10-15 -- 10-15 10-15   Time -- 10 Minutes -- 10 Minutes 10 Minutes     NuStep   Level 1 4 -- 5 5   SPM 70 80 -- 80 80   Minutes 15 15 -- 15 15   METs 2.2 2.5 -- 3.2 2.9     Arm Ergometer   Level 1 3 -- 4 4   Minutes 15 15 -- 15 15   METs 1.8 2.7 -- 2.1 2.6     Home Exercise Plan   Plans to continue exercise at -- -- [employer] (comment)  [place] -- --   Frequency -- -- Add 1 additional day to program exercise sessions. -- --   Initial Home Exercises Provided -- -- 11/15/21 -- --            Exercise Comments:   Exercise Comments     Row Name 10/20/21 1209 11/15/21 1155         Exercise Comments Pt completed first day of exercise. He exercised for 15 min on the arm ergometer and Nustep. Dick averaged 1.7 METs at level 1 on the arm ergometer and 1.9 METs at level 1 on the Nustep. Discussed METs and how to increase METs. He performed the warmup and cooldown without limitations. Completed Dick's home exercise plan. He is not currently doing cardiovascular exercise as he is gardening and doing yardwork. Discussed adding atleast one nonrehab day of 30 min of exercise. He plans to join the [employer] or [employer] soon to  continue to exercise after rehab. He likes the machines here in rehab. Expect good compliance.               Exercise Goals and Review:   Exercise Goals     Row Name 10/14/21 1417 10/19/21 1204 11/16/21 1156 12/12/21 0827       Exercise Goals   Increase Physical Activity Yes Yes Yes Yes    Intervention Provide advice, education, support and counseling about physical activity/exercise needs. Provide advice, education, support and counseling about physical activity/exercise needs.;Develop an individualized exercise prescription for aerobic and resistive training based on initial evaluation findings, risk stratification, comorbidities and participant's personal goals. Provide advice, education, support and counseling about physical activity/exercise needs.;Develop an individualized exercise prescription for aerobic and resistive training based on initial evaluation findings, risk stratification, comorbidities and participant's personal goals. Provide advice, education, support and counseling about physical activity/exercise needs.;Develop an individualized exercise prescription for aerobic and resistive training based on initial evaluation findings, risk stratification, comorbidities and participant's personal goals.    Expected Outcomes Long Term: Add in home exercise to make exercise part of routine and to increase amount of physical activity.;Short Term: Attend rehab on a regular basis to increase amount of physical activity.;Long Term: Exercising regularly at least 3-5 days a week. Long Term: Add in home exercise to make exercise part of routine and to increase amount of physical activity.;Short Term: Attend rehab on a regular basis to increase amount of physical activity.;Long Term: Exercising regularly at least 3-5 days a week. Long Term: Add in home exercise to make exercise part of routine and to  increase amount of physical activity.;Short Term: Attend rehab on a regular basis to increase  amount of physical activity.;Long Term: Exercising regularly at least 3-5 days a week. Long Term: Add in home exercise to make exercise part of routine and to increase amount of physical activity.;Short Term: Attend rehab on a regular basis to increase amount of physical activity.;Long Term: Exercising regularly at least 3-5 days a week.    Increase Strength and Stamina Yes Yes Yes Yes    Intervention Provide advice, education, support and counseling about physical activity/exercise needs.;Develop an individualized exercise prescription for aerobic and resistive training based on initial evaluation findings, risk stratification, comorbidities and participant's personal goals. Provide advice, education, support and counseling about physical activity/exercise needs.;Develop an individualized exercise prescription for aerobic and resistive training based on initial evaluation findings, risk stratification, comorbidities and participant's personal goals. Provide advice, education, support and counseling about physical activity/exercise needs.;Develop an individualized exercise prescription for aerobic and resistive training based on initial evaluation findings, risk stratification, comorbidities and participant's personal goals. Provide advice, education, support and counseling about physical activity/exercise needs.;Develop an individualized exercise prescription for aerobic and resistive training based on initial evaluation findings, risk stratification, comorbidities and participant's personal goals.    Expected Outcomes Short Term: Increase workloads from initial exercise prescription for resistance, speed, and METs.;Short Term: Perform resistance training exercises routinely during rehab and add in resistance training at home;Long Term: Improve cardiorespiratory fitness, muscular endurance and strength as measured by increased METs and functional capacity (6MWT) Short Term: Increase workloads from initial  exercise prescription for resistance, speed, and METs.;Short Term: Perform resistance training exercises routinely during rehab and add in resistance training at home;Long Term: Improve cardiorespiratory fitness, muscular endurance and strength as measured by increased METs and functional capacity (6MWT) Short Term: Increase workloads from initial exercise prescription for resistance, speed, and METs.;Short Term: Perform resistance training exercises routinely during rehab and add in resistance training at home;Long Term: Improve cardiorespiratory fitness, muscular endurance and strength as measured by increased METs and functional capacity (6MWT) Short Term: Increase workloads from initial exercise prescription for resistance, speed, and METs.;Short Term: Perform resistance training exercises routinely during rehab and add in resistance training at home;Long Term: Improve cardiorespiratory fitness, muscular endurance and strength as measured by increased METs and functional capacity (6MWT)    Able to understand and use rate of perceived exertion (RPE) scale Yes Yes Yes Yes    Intervention Provide education and explanation on how to use RPE scale Provide education and explanation on how to use RPE scale Provide education and explanation on how to use RPE scale Provide education and explanation on how to use RPE scale    Expected Outcomes Short Term: Able to use RPE daily in rehab to express subjective intensity level;Long Term:  Able to use RPE to guide intensity level when exercising independently Short Term: Able to use RPE daily in rehab to express subjective intensity level;Long Term:  Able to use RPE to guide intensity level when exercising independently Short Term: Able to use RPE daily in rehab to express subjective intensity level;Long Term:  Able to use RPE to guide intensity level when exercising independently Short Term: Able to use RPE daily in rehab to express subjective intensity level;Long Term:   Able to use RPE to guide intensity level when exercising independently    Able to understand and use Dyspnea scale Yes Yes Yes Yes    Intervention -- Provide education and explanation on how to use Dyspnea scale Provide  education and explanation on how to use Dyspnea scale Provide education and explanation on how to use Dyspnea scale    Expected Outcomes Long Term: Able to use Dyspnea scale to guide intensity level when exercising independently Long Term: Able to use Dyspnea scale to guide intensity level when exercising independently;Short Term: Able to use Dyspnea scale daily in rehab to express subjective sense of shortness of breath during exertion Long Term: Able to use Dyspnea scale to guide intensity level when exercising independently;Short Term: Able to use Dyspnea scale daily in rehab to express subjective sense of shortness of breath during exertion Long Term: Able to use Dyspnea scale to guide intensity level when exercising independently;Short Term: Able to use Dyspnea scale daily in rehab to express subjective sense of shortness of breath during exertion    Knowledge and understanding of Target Heart Rate Range (THRR) Yes Yes Yes Yes    Intervention Provide education and explanation of THRR including how the numbers were predicted and where they are located for reference Provide education and explanation of THRR including how the numbers were predicted and where they are located for reference Provide education and explanation of THRR including how the numbers were predicted and where they are located for reference Provide education and explanation of THRR including how the numbers were predicted and where they are located for reference    Expected Outcomes Short Term: Able to state/look up THRR;Long Term: Able to use THRR to govern intensity when exercising independently;Short Term: Able to use daily as guideline for intensity in rehab Short Term: Able to state/look up THRR;Long Term: Able to use  THRR to govern intensity when exercising independently;Short Term: Able to use daily as guideline for intensity in rehab Short Term: Able to state/look up THRR;Long Term: Able to use THRR to govern intensity when exercising independently;Short Term: Able to use daily as guideline for intensity in rehab Short Term: Able to state/look up THRR;Long Term: Able to use THRR to govern intensity when exercising independently;Short Term: Able to use daily as guideline for intensity in rehab    Understanding of Exercise Prescription Yes Yes Yes Yes    Intervention Provide education, explanation, and written materials on patient's individual exercise prescription Provide education, explanation, and written materials on patient's individual exercise prescription Provide education, explanation, and written materials on patient's individual exercise prescription Provide education, explanation, and written materials on patient's individual exercise prescription    Expected Outcomes Short Term: Able to explain program exercise prescription;Long Term: Able to explain home exercise prescription to exercise independently Short Term: Able to explain program exercise prescription;Long Term: Able to explain home exercise prescription to exercise independently Short Term: Able to explain program exercise prescription;Long Term: Able to explain home exercise prescription to exercise independently Short Term: Able to explain program exercise prescription;Long Term: Able to explain home exercise prescription to exercise independently             Exercise Goals Re-Evaluation :  Exercise Goals Re-Evaluation     Row Name 10/19/21 1204 11/16/21 1156 12/12/21 0827         Exercise Goal Re-Evaluation   Exercise Goals Review Increase Physical Activity;Increase Strength and Stamina;Able to understand and use rate of perceived exertion (RPE) scale;Able to understand and use Dyspnea scale;Knowledge and understanding of Target Heart  Rate Range (THRR);Understanding of Exercise Prescription Increase Physical Activity;Increase Strength and Stamina;Able to understand and use rate of perceived exertion (RPE) scale;Able to understand and use Dyspnea scale;Knowledge and understanding of Target Heart  Rate Range (THRR);Understanding of Exercise Prescription Increase Physical Activity;Increase Strength and Stamina;Able to understand and use rate of perceived exertion (RPE) scale;Able to understand and use Dyspnea scale;Knowledge and understanding of Target Heart Rate Range (THRR);Understanding of Exercise Prescription     Comments Noah Huffman is scheduled to begin exercise this week. Will continue to monitor and progress as able. Noah Huffman has completed 7 exercise sessions. He exercises for 15 min on the arm ergometer and Nustep. Dick averages 2.5 METs at level 3.5 on the arm ergometer and 2.8 METs on the Nustep. Noah Huffman has increased his workload for both exercise modes. He tolerates progressions well. Noah Huffman is motivated to exercise and improve his functional capacity. He performs the warmup and cooldown standing without limitations. We recently discussed home exercise as he is not currently exercising at home. He was encouraged to start exercising outside of rehab. Will continue to monitor and progress as able. Noah Huffman has completed 13 exercise sessions. He exercises for 15 min on the arm ergometer and Nustep. Dick averages 2.7 METs at level 4 on the arm erg and 2.9 METs at level 5 on the Nustep. He performs the warmup and cooldown standing without limitation. Noah Huffman is very motivated to exercise. He has progressed well within rehab. I am confident in Noah Huffman carrying out an exercise regimen at home.     Expected Outcomes Through exercise at rehab and home, the patient will decrease shortness of breath with daily activities and feel confident in carrying out an exercise regimen at home. Through exercise at rehab and home, the patient will decrease shortness of breath with  daily activities and feel confident in carrying out an exercise regimen at home. Through exercise at rehab and home, the patient will decrease shortness of breath with daily activities and feel confident in carrying out an exercise regimen at home.              Discharge Exercise Prescription (Final Exercise Prescription Changes):  Exercise Prescription Changes - 12/06/21 1200       Response to Exercise   Blood Pressure (Admit) 122/60    Blood Pressure (Exercise) 126/62    Blood Pressure (Exit) 120/64    Heart Rate (Admit) 66 bpm    Heart Rate (Exercise) 77 bpm    Heart Rate (Exit) 70 bpm    Oxygen Saturation (Admit) 94 %    Oxygen Saturation (Exercise) 94 %    Oxygen Saturation (Exit) 95 %    Rating of Perceived Exertion (Exercise) 8    Perceived Dyspnea (Exercise) 1    Duration Continue with 30 min of aerobic exercise without signs/symptoms of physical distress.    Intensity THRR unchanged      Progression   Progression Continue to progress workloads to maintain intensity without signs/symptoms of physical distress.      Resistance Training   Training Prescription Yes    Weight Blue bands    Reps 10-15    Time 10 Minutes      NuStep   Level 5    SPM 80    Minutes 15    METs 2.9      Arm Ergometer   Level 4    Minutes 15    METs 2.6             Nutrition:  Target Goals: Understanding of nutrition guidelines, daily intake of sodium <1553m, cholesterol <2041m calories 30% from fat and 7% or less from saturated fats, daily to have 5 or more servings of fruits and  vegetables.  Biometrics:  Pre Biometrics - 10/14/21 1336       Pre Biometrics   Grip Strength 30 kg              Nutrition Therapy Plan and Nutrition Goals:  Nutrition Therapy & Goals - 12/13/21 1207       Nutrition Therapy   Diet Heart Healthy Diet    Drug/Food Interactions Statins/Certain Fruits      Personal Nutrition Goals   Nutrition Goal Patient to choose a variety of  fruits, vegetables, whole grains, low fat dairy, lean protein/plant protein, as part of heart healthy lifestyle.    Personal Goal #2 Patient to limit to <1545m of sodium per day.    Personal Goal #3 Patient to identify and limit food sources of saturated fat, trans fat, sodium, and refined carbohydrates.    Comments Goals in progress. DBarbarann Ehlerscontinues to eat a wide variety of foods including fruits and vegetables. He does keep a home garden and enjoys cooking. He does report eating out multiple times throughout the week due to convenience. He is up 3.3# since starting with our program. He does continue alcohol intake weekly. Per PCP notes on 12/01/2021, blood pressure well controlled, A1c WNL.      Intervention Plan   Intervention Prescribe, educate and counsel regarding individualized specific dietary modifications aiming towards targeted core components such as weight, hypertension, lipid management, diabetes, heart failure and other comorbidities.;Nutrition handout(s) given to patient.    Expected Outcomes Short Term Goal: Understand basic principles of dietary content, such as calories, fat, sodium, cholesterol and nutrients.;Long Term Goal: Adherence to prescribed nutrition plan.             Nutrition Assessments:  Nutrition Assessments - 10/20/21 1159       Rate Your Plate Scores   Pre Score 59            MEDIFICTS Score Key: ?70 Need to make dietary changes  40-70 Heart Healthy Diet ? 40 Therapeutic Level Cholesterol Diet  Flowsheet Row PULMONARY REHAB OTHER RESPIRATORY from 10/20/2021 in [facility] Picture Your Plate Total Score on Admission 59      Picture Your Plate Scores: <<56Unhealthy dietary pattern with much room for improvement. 41-50 Dietary pattern unlikely to meet recommendations for good health and room for improvement. 51-60 More healthful dietary pattern, with some room for improvement.  >60 Healthy dietary pattern, although  there may be some specific behaviors that could be improved.    Nutrition Goals Re-Evaluation:  Nutrition Goals Re-Evaluation     [place] 10/20/21 1118 11/17/21 1120 12/13/21 1207         Goals   Current Weight 271 lb 13.2 oz (123.3 kg)  obese BMI 273 lb 2.4 oz (123.9 kg) 276 lb 10.8 oz (125.5 kg)     Comment Patient reports general decrease in portion sizes consistent with aging. He and his wife cook less at home and pick-up food often for dinner. Patient appears well nourished and he continues to eat three meals per day and enjoys 1-2 bourbon drinks daily. -- --     Expected Outcome Will continue to monitor intake and eating frequency to support advanced lifestage. Goals in progress. Patient reports eating a wide variety of foods and interest in cooking more; gave [employer] cookbook as he has been interested in what is being cooked/prepared for cardiac rehab patients. 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 Discussed high fiber food  choices and mindfulness of saturated fat intake. Lipid panel WNL. Goals in progress. Noah Huffman continues to eat a wide variety of foods including fruits and vegetables. He does keep a home garden and enjoys cooking. He does report eating out multiple times throughout the week due to convenience. He is up 3.3# since starting with our program. He does continue alcohol intake weekly. Per PCP notes on 12/01/2021, blood pressure well controlled, A1c WNL. Noah Huffman does have excellent support from his wife and extended family.              Nutrition Goals Discharge (Final Nutrition Goals Re-Evaluation):  Nutrition Goals Re-Evaluation - 12/13/21 1207       Goals   Current Weight 276 lb 10.8 oz (125.5 kg)    Expected Outcome Goals in progress. Noah Huffman continues to eat a wide variety of foods including fruits and vegetables. He does keep a home garden and enjoys cooking. He does report eating out multiple times throughout the week due to convenience. He is up 3.3# since  starting with our program. He does continue alcohol intake weekly. Per PCP notes on 12/01/2021, blood pressure well controlled, A1c WNL. Noah Huffman does have excellent support from his wife and extended family.             Psychosocial: Target Goals: Acknowledge presence or absence of significant depression and/or stress, maximize coping skills, provide positive support system. Participant is able to verbalize types and ability to use techniques and skills needed for reducing stress and depression.  Initial Review & Psychosocial Screening:  Initial Psych Review & Screening - 10/14/21 1406       Initial Review   Current issues with None Identified      Family Dynamics   Good Support System? Yes    Comments spouse, children, and grandchildren      Barriers   Psychosocial barriers to participate in program There are no identifiable barriers or psychosocial needs.      Screening Interventions   Interventions Encouraged to exercise             Quality of Life Scores:  Scores of 19 and below usually indicate a poorer quality of life in these areas.  A difference of  2-3 points is a clinically meaningful difference.  A difference of 2-3 points in the total score of the Quality of Life Index has been associated with significant improvement in overall quality of life, self-image, physical symptoms, and general health in studies assessing change in quality of life.  PHQ-9: Review Flowsheet       10/14/2021  Depression screen PHQ 2/9  Decreased Interest 0  Down, Depressed, Hopeless 0  PHQ - 2 Score 0  Altered sleeping 0  Tired, decreased energy 0  Change in appetite 0  Feeling bad or failure about yourself  0  Trouble concentrating 0  Moving slowly or fidgety/restless 0  Suicidal thoughts 0  PHQ-9 Score 0   Interpretation of Total Score  Total Score Depression Severity:  1-4 = Minimal depression, 5-9 = Mild depression, 10-14 = Moderate depression, 15-19 = Moderately severe  depression, 20-27 = Severe depression   Psychosocial Evaluation and Intervention:  Psychosocial Evaluation - 10/14/21 1410       Psychosocial Evaluation & Interventions   Interventions Encouraged to exercise with the program and follow exercise prescription    Expected Outcomes For Dick to participate in Pulmonary Rehab without psychosocial concerns.    Continue Psychosocial Services  Follow up required by staff  Psychosocial Re-Evaluation:  Psychosocial Re-Evaluation     [place] Name 10/26/21 0848 11/16/21 1127 12/13/21 0935         Psychosocial Re-Evaluation   Current issues with None Identified None Identified None Identified     Comments Noah Huffman has attented 2 session  thus far he has had no psychosocial barriers. We will continue to monitor. Noah Huffman has been participating in the PR program without any psychosocial barriers. He seems to be enjoying communicating with his classmates and coming to the program. Noah Huffman has been participating in the [facility] program without any psychosocial barriers. He seems to be enjoying communicating with his classmates and coming to the program.     Expected Outcomes For him to attend PR without psychosocial issues or concerns. For him to continue to attend PR without any psychosocial concerns or issues. For him to continue to attend PR without any psychosocial concerns or issues.     Interventions Encouraged to attend Pulmonary Rehabilitation for the exercise Encouraged to attend Pulmonary Rehabilitation for the exercise Encouraged to attend Pulmonary Rehabilitation for the exercise     Continue Psychosocial Services  Follow up required by staff No Follow up required No Follow up required              Psychosocial Discharge (Final Psychosocial Re-Evaluation):  Psychosocial Re-Evaluation - 12/13/21 0935       Psychosocial Re-Evaluation   Current issues with None Identified    Comments Noah Huffman has been participating in the PR program without any  psychosocial barriers. He seems to be enjoying communicating with his classmates and coming to the program.    Expected Outcomes For him to continue to attend PR without any psychosocial concerns or issues.    Interventions Encouraged to attend Pulmonary Rehabilitation for the exercise    Continue Psychosocial Services  No Follow up required             Education: Education Goals: Education classes will be provided on a weekly basis, covering required topics. Participant will state understanding/return demonstration of topics presented.  Learning Barriers/Preferences:  Learning Barriers/Preferences - 10/14/21 1410       Learning Barriers/Preferences   Learning Barriers Sight   wears glasses   Learning Preferences Verbal Instruction;Written Material;Pictoral;Skilled Demonstration             Education Topics: Introduction to Pulmonary Rehab Group instruction provided by PowerPoint, verbal discussion, and written material to support subject matter. Instructor reviews what Pulmonary Rehab is, the purpose of the program, and how patients are referred.     Know Your Numbers Group instruction that is supported by a PowerPoint presentation. Instructor discusses importance of knowing and understanding resting, exercise, and post-exercise oxygen saturation, heart rate, and blood pressure. Oxygen saturation, heart rate, blood pressure, rating of perceived exertion, and dyspnea are reviewed along with a normal range for these values.    Exercise for the Pulmonary Patient Group instruction that is supported by a PowerPoint presentation. Instructor discusses benefits of exercise, core components of exercise, frequency, duration, and intensity of an exercise routine, importance of utilizing pulse oximetry during exercise, safety while exercising, and options of places to exercise outside of rehab.  Flowsheet Row PULMONARY REHAB OTHER RESPIRATORY from 10/27/2021 in [place]  Date 10/20/21  Educator Donnetta Simpers  Instruction Review Code 1- Verbalizes Understanding          MET Level  Group instruction provided by PowerPoint, verbal discussion, and written material to support subject matter. Instructor  reviews what METs are and how to increase METs.    Pulmonary Medications Verbally interactive group education provided by instructor with focus on inhaled medications and proper administration. Flowsheet Row PULMONARY REHAB OTHER RESPIRATORY from 12/08/2021 in [place]  Date 11/10/21  Educator Donnetta Simpers  Instruction Review Code 2- Demonstrated Understanding       Anatomy and Physiology of the Respiratory System Group instruction provided by PowerPoint, verbal discussion, and written material to support subject matter. Instructor reviews respiratory cycle and anatomical components of the respiratory system and their functions. Instructor also reviews differences in obstructive and restrictive respiratory diseases with examples of each.  Flowsheet Row PULMONARY REHAB OTHER RESPIRATORY from 12/08/2021 in [place]  Date 11/17/21  Educator Donnetta Simpers  Instruction Review Code 2- Demonstrated Understanding       Oxygen Safety Group instruction provided by PowerPoint, verbal discussion, and written material to support subject matter. There is an overview of "What is Oxygen" and "Why do we need it".  Instructor also reviews how to create a safe environment for oxygen use, the importance of using oxygen as prescribed, and the risks of noncompliance. There is a brief discussion on traveling with oxygen and resources the patient may utilize. Flowsheet Row PULMONARY REHAB OTHER RESPIRATORY from 12/08/2021 in [place]  Date 11/24/21  Educator Donnetta Simpers  [Handout]  Instruction Review Code 1- Verbalizes Understanding       Oxygen Use Group instruction provided by  PowerPoint, verbal discussion, and written material to discuss how supplemental oxygen is prescribed and different types of oxygen supply systems. Resources for more information are provided.    Breathing Techniques Group instruction that is supported by demonstration and informational handouts. Instructor discusses the benefits of pursed lip and diaphragmatic breathing and detailed demonstration on how to perform both.  Flowsheet Row PULMONARY REHAB OTHER RESPIRATORY from 12/08/2021 in [place]  Date 12/08/21  Educator Donnetta Simpers  Instruction Review Code 1- Verbalizes Understanding        Risk Factor Reduction Group instruction that is supported by a PowerPoint presentation. Instructor discusses the definition of a risk factor, different risk factors for pulmonary disease, and how the heart and lungs work together.   MD Day A group question and answer session with a medical doctor that allows participants to ask questions that relate to their pulmonary disease state.   Nutrition for the Pulmonary Patient Group instruction provided by PowerPoint slides, verbal discussion, and written materials to support subject matter. The instructor gives an explanation and review of healthy diet recommendations, which includes a discussion on weight management, recommendations for fruit and vegetable consumption, as well as protein, fluid, caffeine, fiber, sodium, sugar, and alcohol. Tips for eating when patients are short of breath are discussed. Flowsheet Row PULMONARY REHAB OTHER RESPIRATORY from 12/08/2021 in [place]  Date 11/03/21  Educator RD  Instruction Review Code 1- Verbalizes Understanding        Other Education Group or individual verbal, written, or video instructions that support the educational goals of the pulmonary rehab program. Flowsheet Row PULMONARY REHAB OTHER RESPIRATORY from 10/27/2021 in [place]  Date 10/27/21  Educator Thompson Grayer handout]  Instruction Review Code 1- Verbalizes Understanding        Knowledge Questionnaire Score:  Knowledge Questionnaire Score - 10/14/21 1525       Knowledge Questionnaire Score   Pre  Score 15/18             Core Components/Risk Factors/Patient Goals at Admission:  Personal Goals and Risk Factors at Admission - 10/14/21 1411       Core Components/Risk Factors/Patient Goals on Admission    Weight Management Weight Loss    Improve shortness of breath with ADL's Yes    Intervention Provide education, individualized exercise plan and daily activity instruction to help decrease symptoms of SOB with activities of daily living.    Expected Outcomes Short Term: Improve cardiorespiratory fitness to achieve a reduction of symptoms when performing ADLs;Long Term: Be able to perform more ADLs without symptoms or delay the onset of symptoms             Core Components/Risk Factors/Patient Goals Review:   Goals and Risk Factor Review     Row Name 10/26/21 0850 11/16/21 1136 12/13/21 0936         Core Components/Risk Factors/Patient Goals Review   Personal Goals Review Other;Improve shortness of breath with ADL's;Develop more efficient breathing techniques such as purse lipped breathing and diaphragmatic breathing and practicing self-pacing with activity. Weight Management/Obesity;Improve shortness of breath with ADL's;Develop more efficient breathing techniques such as purse lipped breathing and diaphragmatic breathing and practicing self-pacing with activity. Weight Management/Obesity;Improve shortness of breath with ADL's;Develop more efficient breathing techniques such as purse lipped breathing and diaphragmatic breathing and practicing self-pacing with activity.     Review Noah Huffman has attended 2 PR sessions.Tthus far he has tolerated them well.. He has exercised on the arm egometer and nu step without any issues.We  will continue to monitor core components. Noah Huffman has been exercising on the nustep and using the arm egometer. He has been increasing his workloads and METS.His weight has been stable no real loss or gain.He states that his SOB has not improved. I reminded him that we could not change or cure his disease process.What he needs to improve his SOB is to learn and use breathing techniques.He voices understanding. Noah Huffman has been exercising on the nustep and using the arm egometer. He has been increasing his workloads and METS. His weight has been stable no real loss or gain. He states that his SOB has not improved. I reminded him that we could not change or cure his disease process.What he needs to improve his SOB is to learn and use breathing techniques.He voices understanding. He is scheduled to graduate next week and has tolerated the program very well.     Expected Outcomes See admission goals. See admission goals See admission goals              Core Components/Risk Factors/Patient Goals at Discharge (Final Review):   Goals and Risk Factor Review - 12/13/21 0936       Core Components/Risk Factors/Patient Goals Review   Personal Goals Review Weight Management/Obesity;Improve shortness of breath with ADL's;Develop more efficient breathing techniques such as purse lipped breathing and diaphragmatic breathing and practicing self-pacing with activity.    Review Noah Huffman has been exercising on the nustep and using the arm egometer. He has been increasing his workloads and METS. His weight has been stable no real loss or gain. He states that his SOB has not improved. I reminded him that we could not change or cure his disease process.What he needs to improve his SOB is to learn and use breathing techniques.He voices understanding. He is scheduled to graduate next week and has tolerated the program very well.    Expected Outcomes See  admission goals             ITP Comments:   Comments: Pt is making  expected progress toward Pulmonary Rehab goals after completing 14 sessions. Recommend continued exercise, life style modification, education, and utilization of breathing techniques to increase stamina and strength, while also decreasing shortness of breath with exertion.  Dr. Rodman Pickle is Medical Director for Pulmonary Rehab at [facility].

## 2021-12-15 ENCOUNTER — Encounter (HOSPITAL_COMMUNITY)
Admission: RE | Admit: 2021-12-15 | Discharge: 2021-12-15 | Disposition: A | Discharge status: Home / Self Care | Payer: PPO | Source: Ambulatory Visit | Attending: Internal Medicine | Admitting: Internal Medicine

## 2021-12-15 DIAGNOSIS — J479 Bronchiectasis, uncomplicated: Secondary | ICD-10-CM | POA: Diagnosis not present

## 2021-12-15 NOTE — Progress Notes (Signed)
Daily Session Note  Patient Details  Name: Noah Huffman MRN: [id number] Date of Birth: 1940-09-04 Referring Provider:   April Manson Pulmonary Rehab Walk Test from 10/14/2021 in [place]  Referring Provider [name]       Encounter Date: 12/15/2021  Check In:  Session Check In - 12/15/21 1130       Check-In   Supervising physician immediately available to respond to emergencies [facility] - Physician supervision    Physician(s) Carlis Abbott    Location [facility]    Staff Present Elmon Else, MS, ACSM-CEP, Exercise Physiologist;Samantha [place], RD, LDN;Lisa Ysidro Evert, RN;Carlette Carlton, RN, BSN;Randi Reeve BS, ACSM-CEP, Exercise Physiologist    Virtual Visit No    Medication changes reported     No    Fall or balance concerns reported    No    Tobacco Cessation No Change    Warm-up and Cool-down Performed as group-led Higher education careers adviser Performed Yes    VAD Patient? No    PAD/SET Patient? No      Pain Assessment   Currently in Pain? No/denies             Capillary Blood Glucose: No results found for this or any previous visit (from the past 24 hour(s)).    Social History   Tobacco Use  Smoking Status Former   Packs/day: 2.50   Years: 25.00   Total pack years: 62.50   Types: Cigarettes   Quit date: 03/20/1988   Years since quitting: 33.7  Smokeless Tobacco Never    Goals Met:  Proper associated with RPD/PD & O2 Sat Exercise tolerated well No report of concerns or symptoms today Strength training completed today  Goals Unmet:  Not Applicable  Comments: Service time is from 1013 to 1141.    Dr. Rodman Pickle is Medical Director for Pulmonary Rehab at [facility].

## 2021-12-19 ENCOUNTER — Ambulatory Visit (INDEPENDENT_AMBULATORY_CARE_PROVIDER_SITE_OTHER): Payer: PPO

## 2021-12-19 DIAGNOSIS — I483 Typical atrial flutter: Secondary | ICD-10-CM

## 2021-12-20 ENCOUNTER — Encounter (HOSPITAL_COMMUNITY)
Admission: RE | Admit: 2021-12-20 | Discharge: 2021-12-20 | Disposition: A | Discharge status: Home / Self Care | Payer: PPO | Source: Ambulatory Visit | Attending: Internal Medicine | Admitting: Internal Medicine

## 2021-12-20 VITALS — Wt 273.1 lb

## 2021-12-20 DIAGNOSIS — Z48812 Encounter for surgical aftercare following surgery on the circulatory system: Secondary | ICD-10-CM | POA: Insufficient documentation

## 2021-12-20 DIAGNOSIS — J479 Bronchiectasis, uncomplicated: Secondary | ICD-10-CM | POA: Diagnosis not present

## 2021-12-20 LAB — CUP PACEART REMOTE DEVICE CHECK
Date Time Interrogation Session: 20231001231324
Implantable Pulse Generator Implant Date: 20211122

## 2021-12-20 NOTE — Progress Notes (Signed)
Daily Session Note  Patient Details  Name: Noah Huffman MRN: [id number] Date of Birth: 1940/11/22 Referring Provider:   April Manson Pulmonary Rehab Walk Test from 10/14/2021 in [place]  Referring Provider [place]       Encounter Date: 12/20/2021  Check In:  Session Check In - 12/20/21 1120       Check-In   Supervising physician immediately available to respond to emergencies [facility] - Physician supervision    Physician(s) Dr. Shearon Stalls    Location [facility]    Staff Present Elmon Else, MS, ACSM-CEP, Exercise Physiologist;Lisa Ysidro Evert, RN;Bobetta Korf Yevonne Pax, ACSM-CEP, Exercise Physiologist;Carlette Wilber Oliphant, RN, BSN    Virtual Visit No    Medication changes reported     No    Fall or balance concerns reported    No    Tobacco Cessation No Change    Warm-up and Cool-down Performed as group-led instruction    Resistance Training Performed Yes    VAD Patient? No    PAD/SET Patient? No      Pain Assessment   Currently in Pain? No/denies    Multiple Pain Sites No             Capillary Blood Glucose: No results found for this or any previous visit (from the past 24 hour(s)).   Exercise Prescription Changes - 12/20/21 1200       Response to Exercise   Blood Pressure (Admit) 142/68    Blood Pressure (Exercise) 152/64    Blood Pressure (Exit) 114/50    Heart Rate (Admit) 71 bpm    Heart Rate (Exercise) 79 bpm    Heart Rate (Exit) 78 bpm    Oxygen Saturation (Admit) 97 %    Oxygen Saturation (Exercise) 97 %    Oxygen Saturation (Exit) 78 %    Rating of Perceived Exertion (Exercise) 8    Perceived Dyspnea (Exercise) 1    Duration Continue with 30 min of aerobic exercise without signs/symptoms of physical distress.    Intensity THRR unchanged      Progression   Progression Continue to progress workloads to maintain intensity without signs/symptoms of physical distress.      Resistance Training   Training Prescription  Yes    Weight Blue bands    Reps 10-15    Time 10 Minutes      NuStep   Level 6    SPM 80    Minutes 15    METs 2.9      Arm Ergometer   Level 5    Minutes 15    METs 2.6             Social History   Tobacco Use  Smoking Status Former   Packs/day: 2.50   Years: 25.00   Total pack years: 62.50   Types: Cigarettes   Quit date: 03/20/1988   Years since quitting: 33.7  Smokeless Tobacco Never    Goals Met:  Independence with exercise equipment Improved SOB with ADL's Using PLB without cueing & demonstrates good technique Changing diet to healthy choices, watching portion sizes No report of concerns or symptoms today Strength training completed today  Goals Unmet:  Not Applicable  Comments: Service time is from 1005 to 1131.    Dr. Rodman Pickle is Medical Director for Pulmonary Rehab at [facility].

## 2021-12-22 ENCOUNTER — Encounter (HOSPITAL_COMMUNITY)
Admission: RE | Admit: 2021-12-22 | Discharge: 2021-12-22 | Disposition: A | Discharge status: Home / Self Care | Payer: PPO | Source: Ambulatory Visit | Attending: Internal Medicine | Admitting: Internal Medicine

## 2021-12-22 DIAGNOSIS — J479 Bronchiectasis, uncomplicated: Secondary | ICD-10-CM

## 2021-12-22 DIAGNOSIS — Z48812 Encounter for surgical aftercare following surgery on the circulatory system: Secondary | ICD-10-CM | POA: Diagnosis not present

## 2021-12-22 NOTE — Progress Notes (Signed)
Discharge Progress Report  Patient Details  Name: Noah Huffman MRN: [id number] Date of Birth: [birth year] Referring Provider:   April Manson [facility] Walk Test from 10/14/2021 in [place]  Referring Provider [place]        Number of Visits: 17  Reason for Discharge:  Patient has met program and personal goals.  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 ([place])  ADL [facility]:  Pulmonary Assessment Scores     Row Name 10/14/21 1525 12/08/21 1531       ADL [facility]   ADL Phase Entry Exit    SOB Score total 39 29      CAT Score   CAT Score 16 22      mMRC Score   mMRC Score 1 --             Initial Exercise Prescription:  Initial Exercise Prescription - 10/14/21 1500       Date of Initial Exercise RX and Referring Provider   Date 10/14/21    Referring Provider [name]    Expected Discharge Date 12/22/21      NuStep   Level 1    SPM 70    Minutes 15      Arm Ergometer   Level 1    Watts 5    RPM 40    Minutes 15      Prescription Details   Frequency (times per week) 2    Duration Progress to 30 minutes of continuous aerobic without signs/symptoms of physical distress      Intensity   THRR 40-80% of Max Heartrate 56-112    Ratings of Perceived Exertion 11-13    Perceived Dyspnea 0-4      Progression   Progression Continue to progress workloads to maintain intensity without signs/symptoms of physical distress.      Resistance Training   Training Prescription Yes    Weight blue bands    Reps 10-15             Discharge Exercise Prescription (Final Exercise Prescription Changes):  Exercise Prescription Changes - 12/20/21 1200       Response to Exercise    Blood Pressure (Admit) 142/68    Blood Pressure (Exercise) 152/64    Blood Pressure (Exit) 114/50    Heart Rate (Admit) 71 bpm    Heart Rate (Exercise) 79 bpm    Heart Rate (Exit) 78 bpm    Oxygen Saturation (Admit) 97 %    Oxygen Saturation (Exercise) 97 %    Oxygen Saturation (Exit) 78 %    Rating of Perceived Exertion (Exercise) 8    Perceived Dyspnea (Exercise) 1    Duration Continue with 30 min of aerobic exercise without signs/symptoms of physical distress.    Intensity THRR unchanged      Progression   Progression Continue to progress workloads to maintain intensity without signs/symptoms of physical distress.      Resistance Training   Training Prescription Yes    Weight Blue bands    Reps 10-15    Time 10 Minutes      NuStep   Level 6    SPM 80    Minutes 15  METs 2.9      Arm Ergometer   Level 5    Minutes 15    METs 2.6             Functional Capacity:  6 Minute Walk     Row Name 10/14/21 1521 12/22/21 1529       6 Minute Walk   Phase Initial Discharge    Distance 1227 feet 1620 feet    Distance % Change -- 32.03 %    Distance Feet Change -- 1620 ft    Walk Time 6 minutes 6 minutes    # of Rest Breaks 0 0    MPH 2.32 3.07    METS 1.85 3.35    RPE 13 11    Perceived Dyspnea  1 2    VO2 Peak 6.47 11.71    Symptoms No No    Resting HR 64 bpm 74 bpm    Resting BP 122/60 140/60    Resting Oxygen Saturation  97 % 94 %    Exercise Oxygen Saturation  during 6 min walk 93 % 91 %    Max Ex. HR 79 bpm 119 bpm    Max Ex. BP 152/68 192/70    2 Minute Post BP 134/60 150/60  4 min post: 140/68      Interval HR   1 Minute HR 71 93    2 Minute HR 73 102    3 Minute HR 73 112    4 Minute HR 73 110    5 Minute HR 79 119    6 Minute HR 79 109    2 Minute Post HR 66 93    Interval Heart Rate? Yes --      Interval Oxygen   Interval Oxygen? Yes Yes    Baseline Oxygen Saturation % 97 % 94 %    1 Minute Oxygen Saturation % 91 % 93 %    1 Minute  Liters of Oxygen 0 L 0 L    2 Minute Oxygen Saturation % 96 % 91 %    2 Minute Liters of Oxygen 0 L 0 L    3 Minute Oxygen Saturation % 94 % 93 %    3 Minute Liters of Oxygen 0 L 0 L    4 Minute Oxygen Saturation % 99 % 93 %    4 Minute Liters of Oxygen 0 L 0 L    5 Minute Oxygen Saturation % 95 % 93 %    5 Minute Liters of Oxygen 0 L 0 L    6 Minute Oxygen Saturation % 93 % 92 %    6 Minute Liters of Oxygen 0 L 0 L    2 Minute Post Oxygen Saturation % 95 % 95 %    2 Minute Post Liters of Oxygen 0 L 0 L             Psychological, QOL, Others - Outcomes: PHQ 2/9:    10/14/2021    2:04 PM  Depression screen PHQ 2/9  Decreased Interest 0  Down, Depressed, Hopeless 0  PHQ - 2 Score 0  Altered sleeping 0  Tired, decreased energy 0  Change in appetite 0  Feeling bad or failure about yourself  0  Trouble concentrating 0  Moving slowly or fidgety/restless 0  Suicidal thoughts 0  PHQ-9 Score 0    Quality of Life:   Personal Goals: Goals established at orientation with interventions provided to work toward goal.  Personal  Goals and Risk Factors at Admission - 10/14/21 1411       Core Components/Risk Factors/Patient Goals on Admission    Weight Management Weight Loss    Improve shortness of breath with ADL's Yes    Intervention Provide education, individualized exercise plan and daily activity instruction to help decrease symptoms of SOB with activities of daily living.    Expected Outcomes Short Term: Improve cardiorespiratory fitness to achieve a reduction of symptoms when performing ADLs;Long Term: Be able to perform more ADLs without symptoms or delay the onset of symptoms              Personal Goals Discharge:  Goals and Risk Factor Review     Row Name 10/26/21 0850 11/16/21 1136 12/13/21 0936         Core Components/Risk Factors/Patient Goals Review   Personal Goals Review Other;Improve shortness of breath with ADL's;Develop more efficient breathing  techniques such as purse lipped breathing and diaphragmatic breathing and practicing self-pacing with activity. Weight Management/Obesity;Improve shortness of breath with ADL's;Develop more efficient breathing techniques such as purse lipped breathing and diaphragmatic breathing and practicing self-pacing with activity. Weight Management/Obesity;Improve shortness of breath with ADL's;Develop more efficient breathing techniques such as purse lipped breathing and diaphragmatic breathing and practicing self-pacing with activity.     Review Noah Huffman has attended 2 PR sessions.Tthus far he has tolerated them well.. He has exercised on the arm egometer and nu step without any issues.We will continue to monitor core components. Noah Huffman has been exercising on the nustep and using the arm egometer. He has been increasing his workloads and METS.His weight has been stable no real loss or gain.He states that his SOB has not improved. I reminded him that we could not change or cure his disease process.What he needs to improve his SOB is to learn and use breathing techniques.He voices understanding. Noah Huffman has been exercising on the nustep and using the arm egometer. He has been increasing his workloads and METS. His weight has been stable no real loss or gain. He states that his SOB has not improved. I reminded him that we could not change or cure his disease process.What he needs to improve his SOB is to learn and use breathing techniques.He voices understanding. He is scheduled to graduate next week and has tolerated the program very well.     Expected Outcomes See admission goals. See admission goals See admission goals              Exercise Goals and Review:  Exercise Goals     Row Name 10/14/21 1417 10/19/21 1204 11/16/21 1156 12/12/21 0827       Exercise Goals   Increase Physical Activity Yes Yes Yes Yes    Intervention Provide advice, education, support and counseling about physical activity/exercise needs.  Provide advice, education, support and counseling about physical activity/exercise needs.;Develop an individualized exercise prescription for aerobic and resistive training based on initial evaluation findings, risk stratification, comorbidities and participant's personal goals. Provide advice, education, support and counseling about physical activity/exercise needs.;Develop an individualized exercise prescription for aerobic and resistive training based on initial evaluation findings, risk stratification, comorbidities and participant's personal goals. Provide advice, education, support and counseling about physical activity/exercise needs.;Develop an individualized exercise prescription for aerobic and resistive training based on initial evaluation findings, risk stratification, comorbidities and participant's personal goals.    Expected Outcomes Long Term: Add in home exercise to make exercise part of routine and to increase amount of physical activity.;Short Term: Attend  rehab on a regular basis to increase amount of physical activity.;Long Term: Exercising regularly at least 3-5 days a week. Long Term: Add in home exercise to make exercise part of routine and to increase amount of physical activity.;Short Term: Attend rehab on a regular basis to increase amount of physical activity.;Long Term: Exercising regularly at least 3-5 days a week. Long Term: Add in home exercise to make exercise part of routine and to increase amount of physical activity.;Short Term: Attend rehab on a regular basis to increase amount of physical activity.;Long Term: Exercising regularly at least 3-5 days a week. Long Term: Add in home exercise to make exercise part of routine and to increase amount of physical activity.;Short Term: Attend rehab on a regular basis to increase amount of physical activity.;Long Term: Exercising regularly at least 3-5 days a week.    Increase Strength and Stamina Yes Yes Yes Yes    Intervention Provide  advice, education, support and counseling about physical activity/exercise needs.;Develop an individualized exercise prescription for aerobic and resistive training based on initial evaluation findings, risk stratification, comorbidities and participant's personal goals. Provide advice, education, support and counseling about physical activity/exercise needs.;Develop an individualized exercise prescription for aerobic and resistive training based on initial evaluation findings, risk stratification, comorbidities and participant's personal goals. Provide advice, education, support and counseling about physical activity/exercise needs.;Develop an individualized exercise prescription for aerobic and resistive training based on initial evaluation findings, risk stratification, comorbidities and participant's personal goals. Provide advice, education, support and counseling about physical activity/exercise needs.;Develop an individualized exercise prescription for aerobic and resistive training based on initial evaluation findings, risk stratification, comorbidities and participant's personal goals.    Expected Outcomes Short Term: Increase workloads from initial exercise prescription for resistance, speed, and METs.;Short Term: Perform resistance training exercises routinely during rehab and add in resistance training at home;Long Term: Improve cardiorespiratory fitness, muscular endurance and strength as measured by increased METs and functional capacity (6MWT) Short Term: Increase workloads from initial exercise prescription for resistance, speed, and METs.;Short Term: Perform resistance training exercises routinely during rehab and add in resistance training at home;Long Term: Improve cardiorespiratory fitness, muscular endurance and strength as measured by increased METs and functional capacity (6MWT) Short Term: Increase workloads from initial exercise prescription for resistance, speed, and METs.;Short Term:  Perform resistance training exercises routinely during rehab and add in resistance training at home;Long Term: Improve cardiorespiratory fitness, muscular endurance and strength as measured by increased METs and functional capacity (6MWT) Short Term: Increase workloads from initial exercise prescription for resistance, speed, and METs.;Short Term: Perform resistance training exercises routinely during rehab and add in resistance training at home;Long Term: Improve cardiorespiratory fitness, muscular endurance and strength as measured by increased METs and functional capacity (6MWT)    Able to understand and use rate of perceived exertion (RPE) scale Yes Yes Yes Yes    Intervention Provide education and explanation on how to use RPE scale Provide education and explanation on how to use RPE scale Provide education and explanation on how to use RPE scale Provide education and explanation on how to use RPE scale    Expected Outcomes Short Term: Able to use RPE daily in rehab to express subjective intensity level;Long Term:  Able to use RPE to guide intensity level when exercising independently Short Term: Able to use RPE daily in rehab to express subjective intensity level;Long Term:  Able to use RPE to guide intensity level when exercising independently Short Term: Able to use RPE daily in rehab to  express subjective intensity level;Long Term:  Able to use RPE to guide intensity level when exercising independently Short Term: Able to use RPE daily in rehab to express subjective intensity level;Long Term:  Able to use RPE to guide intensity level when exercising independently    Able to understand and use Dyspnea scale Yes Yes Yes Yes    Intervention -- Provide education and explanation on how to use Dyspnea scale Provide education and explanation on how to use Dyspnea scale Provide education and explanation on how to use Dyspnea scale    Expected Outcomes Long Term: Able to use Dyspnea scale to guide intensity  level when exercising independently Long Term: Able to use Dyspnea scale to guide intensity level when exercising independently;Short Term: Able to use Dyspnea scale daily in rehab to express subjective sense of shortness of breath during exertion Long Term: Able to use Dyspnea scale to guide intensity level when exercising independently;Short Term: Able to use Dyspnea scale daily in rehab to express subjective sense of shortness of breath during exertion Long Term: Able to use Dyspnea scale to guide intensity level when exercising independently;Short Term: Able to use Dyspnea scale daily in rehab to express subjective sense of shortness of breath during exertion    Knowledge and understanding of Target Heart Rate Range (THRR) Yes Yes Yes Yes    Intervention Provide education and explanation of THRR including how the numbers were predicted and where they are located for reference Provide education and explanation of THRR including how the numbers were predicted and where they are located for reference Provide education and explanation of THRR including how the numbers were predicted and where they are located for reference Provide education and explanation of THRR including how the numbers were predicted and where they are located for reference    Expected Outcomes Short Term: Able to state/look up THRR;Long Term: Able to use THRR to govern intensity when exercising independently;Short Term: Able to use daily as guideline for intensity in rehab Short Term: Able to state/look up THRR;Long Term: Able to use THRR to govern intensity when exercising independently;Short Term: Able to use daily as guideline for intensity in rehab Short Term: Able to state/look up THRR;Long Term: Able to use THRR to govern intensity when exercising independently;Short Term: Able to use daily as guideline for intensity in rehab Short Term: Able to state/look up THRR;Long Term: Able to use THRR to govern intensity when exercising  independently;Short Term: Able to use daily as guideline for intensity in rehab    Understanding of Exercise Prescription Yes Yes Yes Yes    Intervention Provide education, explanation, and written materials on patient's individual exercise prescription Provide education, explanation, and written materials on patient's individual exercise prescription Provide education, explanation, and written materials on patient's individual exercise prescription Provide education, explanation, and written materials on patient's individual exercise prescription    Expected Outcomes Short Term: Able to explain program exercise prescription;Long Term: Able to explain home exercise prescription to exercise independently Short Term: Able to explain program exercise prescription;Long Term: Able to explain home exercise prescription to exercise independently Short Term: Able to explain program exercise prescription;Long Term: Able to explain home exercise prescription to exercise independently Short Term: Able to explain program exercise prescription;Long Term: Able to explain home exercise prescription to exercise independently             Exercise Goals Re-Evaluation:  Exercise Goals Re-Evaluation     Row Name 10/19/21 1204 11/16/21 1156 12/12/21 0827  Exercise Goal Re-Evaluation   Exercise Goals Review Increase Physical Activity;Increase Strength and Stamina;Able to understand and use rate of perceived exertion (RPE) scale;Able to understand and use Dyspnea scale;Knowledge and understanding of Target Heart Rate Range (THRR);Understanding of Exercise Prescription Increase Physical Activity;Increase Strength and Stamina;Able to understand and use rate of perceived exertion (RPE) scale;Able to understand and use Dyspnea scale;Knowledge and understanding of Target Heart Rate Range (THRR);Understanding of Exercise Prescription Increase Physical Activity;Increase Strength and Stamina;Able to understand and use  rate of perceived exertion (RPE) scale;Able to understand and use Dyspnea scale;Knowledge and understanding of Target Heart Rate Range (THRR);Understanding of Exercise Prescription     Comments Noah Huffman is scheduled to begin exercise this week. Will continue to monitor and progress as able. Noah Huffman has completed 7 exercise sessions. He exercises for 15 min on the arm ergometer and Nustep. Noah Huffman averages 2.5 METs at level 3.5 on the arm ergometer and 2.8 METs on the Nustep. Noah Huffman has increased his workload for both exercise modes. He tolerates progressions well. Noah Huffman is motivated to exercise and improve his functional capacity. He performs the warmup and cooldown standing without limitations. We recently discussed home exercise as he is not currently exercising at home. He was encouraged to start exercising outside of rehab. Will continue to monitor and progress as able. Noah Huffman has completed 13 exercise sessions. He exercises for 15 min on the arm ergometer and Nustep. Noah Huffman averages 2.7 METs at level 4 on the arm erg and 2.9 METs at level 5 on the Nustep. He performs the warmup and cooldown standing without limitation. Noah Huffman is very motivated to exercise. He has progressed well within rehab. I am confident in Noah Huffman carrying out an exercise regimen at home.     Expected Outcomes Through exercise at rehab and home, the patient will decrease shortness of breath with daily activities and feel confident in carrying out an exercise regimen at home. Through exercise at rehab and home, the patient will decrease shortness of breath with daily activities and feel confident in carrying out an exercise regimen at home. Through exercise at rehab and home, the patient will decrease shortness of breath with daily activities and feel confident in carrying out an exercise regimen at home.              Nutrition & Weight - Outcomes:  Pre Biometrics - 10/14/21 1336       Pre Biometrics   Grip Strength 30 kg             Post  Biometrics - 12/22/21 1528        Post  Biometrics   Grip Strength 32 kg             Nutrition:  Nutrition Therapy & Goals - 12/13/21 1207       Nutrition Therapy   Diet Heart Healthy Diet    Drug/Food Interactions Statins/Certain Fruits      Personal Nutrition Goals   Nutrition Goal Patient to choose a variety of fruits, vegetables, whole grains, low fat dairy, lean protein/plant protein, as part of heart healthy lifestyle.    Personal Goal #2 Patient to limit to '1500mg'$  of sodium per day.    Personal Goal #3 Patient to identify and limit food sources of saturated fat, trans fat, sodium, and refined carbohydrates.    Comments Goals in progress. Noah Huffman continues to eat a wide variety of foods including fruits and vegetables. He does keep a home garden and enjoys cooking. He does report eating  out multiple times throughout the week due to convenience. He is up 3.3# since starting with our program. He does continue alcohol intake weekly. Per PCP notes on 12/01/2021, blood pressure well controlled, A1c WNL.      Intervention Plan   Intervention Prescribe, educate and counsel regarding individualized specific dietary modifications aiming towards targeted core components such as weight, hypertension, lipid management, diabetes, heart failure and other comorbidities.;Nutrition handout(s) given to patient.    Expected Outcomes Short Term Goal: Understand basic principles of dietary content, such as calories, fat, sodium, cholesterol and nutrients.;Long Term Goal: Adherence to prescribed nutrition plan.             Nutrition Discharge:  Nutrition Assessments - 10/20/21 1159       Rate Your Plate Scores   Pre Score 59             Education Questionnaire Score:  Knowledge Questionnaire Score - 10/14/21 1525       Knowledge Questionnaire Score   Pre Score 15/18             Goals reviewed with patient; copy given to patient.

## 2021-12-22 NOTE — Progress Notes (Signed)
Daily Session Note  Patient Details  Name: Noah Huffman MRN: [id number] Date of Birth: 1941-01-12 Referring Provider:   April Manson [facility] Re[facility] Walk Test from 10/14/2021 in [place]  Referring Provider [name]       Encounter Date: 12/22/2021  Check In:  Session Check In - 12/22/21 1119       Check-In   Supervising physician immediately available to respond to emergencies [facility] - Physician supervision    Physician(s) Shearon Stalls    Location [facility]    Staff Present Elmon Else, MS, ACSM-CEP, Exercise Physiologist;Samantha [place], RD, LDN;Lisa Ysidro Evert, RN;Randi [facility], ACSM-CEP, Exercise Physiologist;Jetta Walker BS, ACSM-CEP, Exercise Physiologist    Virtual Visit No    Medication changes reported     No    Fall or balance concerns reported    No    Warm-up and Cool-down Not performed (comment)   walk test only   Resistance Training Performed No    VAD Patient? No    PAD/SET Patient? No      Pain Assessment   Currently in Pain? No/denies             Capillary Blood Glucose: No results found for this or any previous visit (from the past 24 hour(s)).    Social History   Tobacco Use  Smoking Status Former   Packs/day: 2.50   Years: 25.00   Total pack years: 62.50   Types: Cigarettes   Quit date: 03/20/1988   Years since quitting: 33.7  Smokeless Tobacco Never    Goals Met:  Proper associated with RPD/PD & O2 Sat Exercise tolerated well No report of concerns or symptoms today  Goals Unmet:  Not Applicable  Comments: Service time is from 0948 to 1010.    Dr. Rodman Pickle is Medical Director for Pulmonary Rehab at [facility].

## 2022-01-02 DIAGNOSIS — H16223 Keratoconjunctivitis sicca, not specified as Sjogren's, bilateral: Secondary | ICD-10-CM | POA: Diagnosis not present

## 2022-01-02 DIAGNOSIS — H04213 Epiphora due to excess lacrimation, bilateral lacrimal glands: Secondary | ICD-10-CM | POA: Diagnosis not present

## 2022-01-03 NOTE — Progress Notes (Signed)
[facility] Summary Report / Loop Recorder 

## 2022-01-10 DIAGNOSIS — H903 Sensorineural hearing loss, bilateral: Secondary | ICD-10-CM | POA: Diagnosis not present

## 2022-01-11 DIAGNOSIS — H04223 Epiphora due to insufficient drainage, bilateral lacrimal glands: Secondary | ICD-10-CM | POA: Diagnosis not present

## 2022-01-11 DIAGNOSIS — H04221 Epiphora due to insufficient drainage, right lacrimal gland: Secondary | ICD-10-CM | POA: Diagnosis not present

## 2022-01-11 DIAGNOSIS — H04222 Epiphora due to insufficient drainage, left lacrimal gland: Secondary | ICD-10-CM | POA: Diagnosis not present

## 2022-01-16 DIAGNOSIS — N401 Enlarged prostate with lower urinary tract symptoms: Secondary | ICD-10-CM | POA: Diagnosis not present

## 2022-01-23 ENCOUNTER — Ambulatory Visit (INDEPENDENT_AMBULATORY_CARE_PROVIDER_SITE_OTHER): Payer: PPO

## 2022-01-23 DIAGNOSIS — I483 Typical atrial flutter: Secondary | ICD-10-CM | POA: Diagnosis not present

## 2022-01-23 DIAGNOSIS — R351 Nocturia: Secondary | ICD-10-CM | POA: Diagnosis not present

## 2022-01-23 DIAGNOSIS — R972 Elevated prostate specific antigen [PSA]: Secondary | ICD-10-CM | POA: Diagnosis not present

## 2022-01-23 DIAGNOSIS — N401 Enlarged prostate with lower urinary tract symptoms: Secondary | ICD-10-CM | POA: Diagnosis not present

## 2022-01-24 LAB — CUP PACEART REMOTE DEVICE CHECK
Date Time Interrogation Session: 20231105231437
Implantable Pulse Generator Implant Date: 20211122

## 2022-02-23 NOTE — Progress Notes (Signed)
[facility] Summary Report / Loop Recorder 

## 2022-02-27 ENCOUNTER — Ambulatory Visit (INDEPENDENT_AMBULATORY_CARE_PROVIDER_SITE_OTHER): Payer: PPO

## 2022-02-27 DIAGNOSIS — I483 Typical atrial flutter: Secondary | ICD-10-CM

## 2022-02-27 LAB — CUP PACEART REMOTE DEVICE CHECK
Date Time Interrogation Session: 20231210231336
Implantable Pulse Generator Implant Date: 20211122

## 2022-04-03 ENCOUNTER — Ambulatory Visit (INDEPENDENT_AMBULATORY_CARE_PROVIDER_SITE_OTHER): Payer: PPO

## 2022-04-03 DIAGNOSIS — I483 Typical atrial flutter: Secondary | ICD-10-CM

## 2022-04-04 LAB — CUP PACEART REMOTE DEVICE CHECK
Date Time Interrogation Session: 20240112231145
Implantable Pulse Generator Implant Date: 20211122

## 2022-04-05 ENCOUNTER — Telehealth: Payer: Self-pay | Admitting: Internal Medicine

## 2022-04-05 ENCOUNTER — Encounter: Payer: Self-pay | Admitting: Nurse Practitioner

## 2022-04-05 ENCOUNTER — Ambulatory Visit: Payer: PPO | Admitting: Nurse Practitioner

## 2022-04-05 ENCOUNTER — Ambulatory Visit (INDEPENDENT_AMBULATORY_CARE_PROVIDER_SITE_OTHER): Payer: PPO

## 2022-04-05 VITALS — BP 122/70 | HR 66 | Ht 76.0 in | Wt 282.6 lb

## 2022-04-05 DIAGNOSIS — J189 Pneumonia, unspecified organism: Secondary | ICD-10-CM | POA: Diagnosis not present

## 2022-04-05 DIAGNOSIS — J4489 Other specified chronic obstructive pulmonary disease: Secondary | ICD-10-CM | POA: Insufficient documentation

## 2022-04-05 DIAGNOSIS — J45901 Unspecified asthma with (acute) exacerbation: Secondary | ICD-10-CM

## 2022-04-05 DIAGNOSIS — J441 Chronic obstructive pulmonary disease with (acute) exacerbation: Secondary | ICD-10-CM | POA: Insufficient documentation

## 2022-04-05 DIAGNOSIS — J3089 Other allergic rhinitis: Secondary | ICD-10-CM | POA: Diagnosis not present

## 2022-04-05 DIAGNOSIS — J302 Other seasonal allergic rhinitis: Secondary | ICD-10-CM | POA: Diagnosis not present

## 2022-04-05 DIAGNOSIS — D721 Eosinophilia, unspecified: Secondary | ICD-10-CM

## 2022-04-05 DIAGNOSIS — J449 Chronic obstructive pulmonary disease, unspecified: Secondary | ICD-10-CM | POA: Diagnosis not present

## 2022-04-05 LAB — CBC WITH DIFFERENTIAL/PLATELET
Basophils Absolute: 0.1 10*3/uL (ref 0.0–0.1)
Basophils Relative: 0.7 % (ref 0.0–3.0)
Eosinophils Absolute: 0.8 10*3/uL — ABNORMAL HIGH (ref 0.0–0.7)
Eosinophils Relative: 9.6 % — ABNORMAL HIGH (ref 0.0–5.0)
HCT: 41.6 % (ref 39.0–52.0)
Hemoglobin: 14.4 g/dL (ref 13.0–17.0)
Lymphocytes Relative: 28.6 % (ref 12.0–46.0)
Lymphs Abs: 2.5 10*3/uL (ref 0.7–4.0)
MCHC: 34.6 g/dL (ref 30.0–36.0)
MCV: 92 fl (ref 78.0–100.0)
Monocytes Absolute: 0.7 10*3/uL (ref 0.1–1.0)
Monocytes Relative: 7.4 % (ref 3.0–12.0)
Neutro Abs: 4.7 10*3/uL (ref 1.4–7.7)
Neutrophils Relative %: 53.7 % (ref 43.0–77.0)
Platelets: 229 10*3/uL (ref 150.0–400.0)
RBC: 4.52 Mil/uL (ref 4.22–5.81)
RDW: 13.7 % (ref 11.5–15.5)
WBC: 8.8 10*3/uL (ref 4.0–10.5)

## 2022-04-05 LAB — POCT EXHALED NITRIC OXIDE: FeNO level (ppb): 25

## 2022-04-05 MED ORDER — PREDNISONE 10 MG PO TABS: ORAL_TABLET | ORAL | 0 refills | Status: DC

## 2022-04-05 MED ORDER — AMOXICILLIN-POT CLAVULANATE 875-125 MG PO TABS: 1.0000 | ORAL_TABLET | Freq: Two times a day (BID) | ORAL | 0 refills | Status: AC

## 2022-04-05 MED ORDER — MONTELUKAST SODIUM 10 MG PO TABS: 10.0000 mg | ORAL_TABLET | Freq: Every day | ORAL | 5 refills | Status: DC

## 2022-04-05 MED ORDER — METHYLPREDNISOLONE ACETATE 80 MG/ML IJ SUSP: 80.0000 mg | Freq: Once | INTRAMUSCULAR | Status: DC

## 2022-04-05 MED ORDER — LEVOCETIRIZINE DIHYDROCHLORIDE 5 MG PO TABS: 5.0000 mg | ORAL_TABLET | Freq: Every evening | ORAL | 1 refills | Status: DC

## 2022-04-05 MED ORDER — ALBUTEROL SULFATE (2.5 MG/3ML) 0.083% IN NEBU: 2.5000 mg | INHALATION_SOLUTION | Freq: Four times a day (QID) | RESPIRATORY_TRACT | Status: AC | PRN

## 2022-04-05 NOTE — Patient Instructions (Addendum)
-  Continue Albuterol inhaler 2 puffs or 3 mL neb every 6 hours as needed for shortness of breath or wheezing. Notify if symptoms persist despite rescue inhaler/neb use. -Continue Breztri 2 puffs Twice daily. Brush tongue and rinse mouth well afterwards. Use with spacer.  -Continue flonase 2 spray each nostril daily -Continue saline nasal spray 1 spray each nostril 1-2 times a day  -Continue on supplemental oxygen 2 lpm at night -Continue Mucinex 600 mg Twice daily for chest congestion -Continue Astelin nasal spray 2 sprays each nostril Twice daily   -Restart Xyzal (levocetirizine) 10 mg daily for allergies in the morning -Restart Singulair (montelukast) 10 mg At bedtime    Prednisone taper. 4 tabs for 2 days, then 3 tabs for 2 days, 2 tabs for 2 days, then 1 tab for 2 days, then stop. Take in AM with food.  Augmentin 1 tab Twice daily for 7 days. Take with food. Eat yogurt or take a daily probiotic while on this  Use your neb four times a day until symptoms improve  If your symptoms rapidly worsen, you have trouble talking or extreme difficulty breathing, or you're not getting relief from your nebulizer/rescue, go to the emergency department.  Labs today   Follow up in 2 weeks with Dr. [name] or Alanson Aly. If symptoms do not improve or worsen, please contact office for sooner follow up or seek emergency care.

## 2022-04-05 NOTE — Assessment & Plan Note (Signed)
He has been off of antihistamines/singulair for months now. Poorly controlled today. Reviewed the importance of compliance. Verbalized understanding. Advised him to get back in with allergist. See above plan.

## 2022-04-05 NOTE — Progress Notes (Addendum)
$'@Patient'a$  ID: Noah Huffman, male    DOB: Jul 19, 1940, 82 y.o.   MRN: [id number]  Chief Complaint  Patient presents with   Follow-up    Pt f/u he is having an increased amount of mucous in the morning, states it takes a couple hours to clear it out. He is having increased SOB/wheezing in the last 3 months.     Referring provider: [name], MD  HPI: 82 year old male, former smoker (62.5-pack-year history) followed for COPD Gold 3, chronic cough and OSA.  He is a patient of Dr. Golden Pop and also followed by Dr. Annamaria Boots for sleep.  Last seen in office on 06/21/2021.  Past medical history significant for history of asthma, hypertension, A-fib, GERD, OA, BPH, obesity, HLD.  TEST/EVENTS:  04/27/2013 and PSG: AHI 16.5/h, desaturation 87% 01/30/2020 HRCT chest: Atherosclerosis, CAD.  Mild diffuse bilateral bronchial wall thickening.  Mild band appearing scarring and volume loss of bases, left greater than right; similar in appearance when compared to previous. 09/03/2019 echo: On 2 L/min, showed sustained hypoxemia and bradycardia 12/24/2020 NPSG: AHI 9/h, desaturation 85% 02/18/2021 PFTs: FVC 3.46 (70), FEV1 2.4 (67, ratio 69, TLC 91%, DLCO uncorrected 85%.  Moderate obstructive airway disease with normal diffusion capacity.  No BD 11/01/2021 HRCT chest: atherosclerosis. Ascending aorta measures up to 4.2 cm. Enlarged pulmonic trunk. No ILD. Mild basilar scarring. Scattered mucoid impaction in the lower lobes. 3 mm nodule in the subpleural aspect of the superior segment RLL, unchanged. New 3 mm subpleural left upper lobe nodule, new. Mild air trapping.   01/24/2021: OV with Dr. Annamaria Boots for f/u after sleep study. Discussed results. Concerned about nasal mask d/t not much airflow from previous nasal fx. Also concerned about claustrophobia with full face mask. Tx recently by PCP for AECOPD with prednisone and abx. Continued Breztri and PRN albuterol. Pt decided to wait to move forward with CPAP therapy or  oral appliance until he discussed effects of untreated mild OSA on his a fib with his cardiologist. Continued on nocturnal 2 lpm supplemental O2.  05/19/2021: OV with Noah Huffman Noah Huffman.  Worsening shortness of breath and ongoing productive cough over the last 2 to 3 months.  Felt as though activity intolerance had declined.  Had been treated a few times for AECOPD by his PCP with prednisone and antibiotics over the last 6 months.  Also has some increasing allergy type symptoms including runny nose, itchy eyes and scratchy throat.  Started take over-the-counter allergy medicine from [facility] and uses Flonase and saline nasal spray.  Previously had met with Dr. Annamaria Boots to discuss mild OSA and treatment options.  Still unsure at this visit what he would like to do next.  Continued on 2 L/min supplemental O2 at night.  Continued on triple therapy regimen with Breztri.  CBC with differential and IgE obtained which showed significant eosinophilia and elevated IgE.  Started on Singulair and Xyzal.  Provided with short prednisone burst.  Plans to obtain FeNO at follow-up.  CXR without any evidence of superimposed infection  06/02/2021: OV with Noah Huffman Noah Huffman for follow-up.  Feels as though his breathing is slightly improved although he does still continue to experience shortness of breath from exertion.  Feels as though he still has activity limitations.  Does feel like his cough has improved and is not as frequent as it used to be.  Production has mostly resolved.  Does continue to have persistent allergy symptoms with itchy eyes and sneezing.  Started on Xyzal and Singulair at his  last visit.  Denies lower extremity edema, orthopnea or PND, hemoptysis.  Has seen an allergist in the past.  Continued on Breztri twice daily.  Rarely uses albuterol. FeNO 23 ppb.   06/21/2021: OV with Dr. [name]. Better after March and starting singulair and xyzal. Seen Dr. [name] in the past; would like to go back to him. Referred to pulmonary rehab.  Spiro/DLCO in 4 months. HRCT in 4 months.   04/05/2022: Today - acute Patient presents today for acute visit. He was doing better after I saw him last. He went to pulmonary rehab as well, which he's not sure made a huge difference but he graduated in October. He did not continue to do the exercises afterwards. He's not sure if this is the reason why but he has been more short of breath over the last 2-3 months. Feels like he hasn't been able to complete activities around the house without getting out of breath. Then over the past two weeks, he started having more wheezing, chest congestion, and a productive cough with cream colored sputum, mostly in AM. He also has postnasal drainage and feels like he wakes up choked in the morning from the mucus. He denies fevers, chills, hemoptysis, orthopnea, PND, leg swelling. He is on [employer]. He uses his flonase and astelin daily. Takes mucinex twice a day. He is not still on xyzal or singulair. Not sure when he stopped these.  HRCT chest from August 2023 did not show any ILD/IPF. He did have some mucous plugging and mild air trapping.   FeNO 25 ppb  Allergies  Allergen Reactions   Macrobid [Nitrofurantoin]    Morphine And Related Rash    Immunization History  Administered Date(s) Administered   Fluad Quad(high Dose 65+) 12/02/2018, 12/24/2019   Influenza Split 04/13/2011, 03/20/2012, 04/18/2012   Influenza, Quadrivalent, Recombinant, Inj, Pf 12/24/2019   Influenza,inj,Quad PF,6+ Mos 01/14/2017   Influenza,inj,quad, With Preservative 12/11/2018   PFIZER(Purple Top)SARS-COV-2 Vaccination 05/15/2019, 06/10/2019   Pneumococcal Conjugate-13 02/28/2013, 04/21/2013, 05/06/2015   Pneumococcal Polysaccharide-23 01/23/2007, 05/09/2016   Td 03/21/2004, 08/25/2013, 08/25/2013   Unspecified SARS-COV-2 Vaccination 05/21/2019    Past Medical History:  Diagnosis Date   Allergy    seasonal   Asthma    Atrial fibrillation (HCC)    BPH (benign prostatic  hypertrophy) with urinary obstruction    Cataract    removed both eyes   COPD (chronic obstructive pulmonary disease) (HCC)    Diverticulosis    Dysrhythmia    GERD (gastroesophageal reflux disease)    Hyperglycemia    Hyperlipidemia    Hypertension    Inguinal hernia    Neuromuscular disorder (HCC)    neuropathy in feet    Obesity    OSA (obstructive sleep apnea) 06/06/2013   Osteoarthritis    Sleep apnea    no cpap    Tobacco History: Social History   Tobacco Use  Smoking Status Former   Packs/day: 2.50   Years: 25.00   Total pack years: 62.50   Types: Cigarettes   Quit date: 03/20/1988   Years since quitting: 34.0  Smokeless Tobacco Never   Counseling given: Not Answered   Outpatient Medications Prior to Visit  Medication Sig Dispense Refill   albuterol (VENTOLIN HFA) 108 (90 Base) MCG/ACT inhaler Inhale 2 puffs into the lungs every 6 (six) hours as needed for wheezing or shortness of breath. 8 g 6   aspirin EC 81 MG tablet Take 1 tablet (81 mg total) by mouth daily. Swallow whole.  90 tablet 3   BREZTRI AEROSPHERE 160-9-4.8 MCG/ACT AERO INHALE 2 PUFFS BY MOUTH INTO THE LUNGS IN THE MORNING AND AT BEDTIME 10.7 g 5   fluticasone (FLONASE) 50 MCG/ACT nasal spray Place 1 spray into both nostrils daily.     gabapentin (NEURONTIN) 600 MG tablet Take 1,800 mg by mouth 3 (three) times daily.     guaiFENesin (MUCINEX) 600 MG 12 hr tablet Take 1 tablet (600 mg total) by mouth 2 (two) times daily. 60 tablet 3   losartan-hydrochlorothiazide (HYZAAR) 100-25 MG tablet Take 1 tablet by mouth daily.     Multiple Vitamins-Minerals (PRESERVISION AREDS 2) CAPS Take 1 capsule by mouth daily.      polyvinyl alcohol (LIQUIFILM TEARS) 1.4 % ophthalmic solution Place 1 drop into both eyes 2 (two) times daily.     rosuvastatin (CRESTOR) 10 MG tablet Take 10 mg by mouth at bedtime.     sodium chloride (OCEAN) 0.65 % SOLN nasal spray Place 1 spray into both nostrils daily.     montelukast  (SINGULAIR) 10 MG tablet Take 1 tablet (10 mg total) by mouth at bedtime. 30 tablet 5   azelastine (ASTELIN) 0.1 % nasal spray Place 2 sprays into both nostrils 2 (two) times daily. Use in each nostril as directed (Patient not taking: Reported on 04/05/2022) 30 mL 3   tamsulosin (FLOMAX) 0.4 MG CAPS capsule Take 0.4 mg by mouth daily. (Patient not taking: Reported on 04/05/2022)     benzonatate (TESSALON) 200 MG capsule Take 1 capsule (200 mg total) by mouth 3 (three) times daily as needed for cough. (Patient not taking: Reported on 10/20/2021) 30 capsule 0   levocetirizine (XYZAL) 5 MG tablet Take 1 tablet (5 mg total) by mouth every evening. (Patient not taking: Reported on 04/05/2022) 90 tablet 1   Facility-Administered Medications Prior to Visit  Medication Dose Route Frequency Provider Last Rate Last Admin   0.9 %  sodium chloride infusion  500 mL Intravenous Once Irene Shipper, MD         Review of Systems:   Constitutional: No weight loss or gain, night sweats, fevers, chills, fatigue, or lassitude. HEENT: No headaches, difficulty swallowing, tooth/dental problems, or sore throat. No sneezing,ear ache. +nasal congestion, white to yellow rhinorrhea, post nasal drip CV:  No chest pain, orthopnea, PND, swelling in lower extremities, anasarca, dizziness, palpitations, syncope Resp: +shortness of breath with exertion; productive cough; wheezing; chest congestion. No hemoptysis.  No chest wall deformity GI:  No heartburn, indigestion, abdominal pain, nausea, vomiting, diarrhea, change in bowel habits, loss of appetite, bloody stools.  GU: No dysuria, change in color of urine, urgency or frequency.   Skin: No rash, lesions, ulcerations MSK:  No joint pain or swelling.   Neuro: No dizziness or lightheadedness.  Psych: No depression or anxiety. Mood stable.     Physical Exam:  BP 122/70   Pulse 66   Ht '6\' 4"'$  (1.93 m)   Wt 282 lb 9.6 oz (128.2 kg)   SpO2 99%   BMI 34.40 kg/m   GEN:  Pleasant, interactive, well-appearing; obese; in no acute distress. HEENT:  Normocephalic and atraumatic. PERRLA. Sclera white. Nasal turbinates boggy, moist and patent bilaterally.  Clear rhinorrhea present. Oropharynx erythematous and moist, without exudate or edema. No lesions, ulcerations.  NECK:  Supple w/ fair ROM. No JVD present. Normal carotid impulses w/o bruits. Thyroid symmetrical with no goiter or nodules palpated. No lymphadenopathy.   CV: RRR, no m/r/g, no peripheral edema. Pulses intact, +  2 bilaterally. No cyanosis, pallor or clubbing. PULMONARY:  Unlabored, regular breathing. Scattered wheezes bilaterally A&P w/o wheezes/rales/rhonchi. Bronchitic cough. No accessory muscle use. No dullness to percussion. GI: BS present and normoactive. Soft, non-tender to palpation. No organomegaly or masses detected.  MSK: No erythema, warmth or tenderness. Cap refil <2 sec all extrem. No deformities or joint swelling noted.  Neuro: A/Ox3. No focal deficits noted.   Skin: Warm, no lesions or rashe Psych: Normal affect and behavior. Judgement and thought content appropriate.     Lab Results:  CBC    Component Value Date/Time   WBC 7.8 05/19/2021 1130   RBC 4.73 05/19/2021 1130   HGB 15.1 05/19/2021 1130   HGB 14.1 12/31/2019 1004   HCT 43.9 05/19/2021 1130   HCT 41.9 12/31/2019 1004   PLT 218.0 05/19/2021 1130   PLT 204 12/31/2019 1004   MCV 92.9 05/19/2021 1130   MCV 91 12/31/2019 1004   MCH 31.4 03/23/2020 1140   MCHC 34.4 05/19/2021 1130   RDW 14.0 05/19/2021 1130   RDW 14.6 12/31/2019 1004   LYMPHSABS 2.1 05/19/2021 1130   LYMPHSABS 2.3 12/31/2019 1004   MONOABS 0.5 05/19/2021 1130   EOSABS 0.8 (H) 05/19/2021 1130   EOSABS 0.4 12/31/2019 1004   BASOSABS 0.1 05/19/2021 1130   BASOSABS 0.0 12/31/2019 1004    BMET    Component Value Date/Time   NA 140 05/19/2021 1130   NA 140 12/31/2019 1004   K 3.7 05/19/2021 1130   CL 101 05/19/2021 1130   CO2 32 05/19/2021 1130    GLUCOSE 127 (H) 05/19/2021 1130   BUN 18 05/19/2021 1130   BUN 21 12/31/2019 1004   CREATININE 0.90 05/19/2021 1130   CALCIUM 9.4 05/19/2021 1130   GFRNONAA >60 03/23/2020 1140   GFRAA 102 12/31/2019 1004    BNP No results found for: "BNP"   Imaging:  DG Chest 2 View  Result Date: 04/05/2022 CLINICAL DATA:  COPD, asthma exacerbation EXAM: CHEST - 2 VIEW COMPARISON:  Chest radiograph 05/19/2021, CT chest 11/01/2021 FINDINGS: A left chest wall cardiac recorder device is noted. The cardiomediastinal silhouette is stable and within normal limits. Linear opacities in the left lung base likely reflects subsegmental atelectasis. Otherwise, there is no focal consolidation or pulmonary edema. There is no pleural effusion or pneumothorax There is no acute osseous abnormality. Right shoulder arthroplasty hardware is noted. IMPRESSION: Probable left basilar subsegmental atelectasis. Otherwise, no radiographic evidence of acute cardiopulmonary process. Electronically Signed   By: Valetta Mole M.D.   On: 04/05/2022 12:07   CUP PACEART REMOTE DEVICE CHECK  Result Date: 04/04/2022 ILR summary report received. Battery status OK. Normal device function. No new symptom, tachy, brady, or pause episodes. No new AF episodes. Monthly summary reports and ROV/PRN LA        Latest Ref Rng & Units 02/18/2021   11:18 AM 03/15/2020   11:59 AM 01/21/2019    1:47 PM 10/08/2017   11:44 AM  PFT Results  FVC-Pre L 3.43  3.44  3.73  4.03   FVC-Predicted Pre % 69  69  74  79   FVC-Post L 3.46  3.37  3.69  3.68   FVC-Predicted Post % 70  67  73  72   Pre FEV1/FVC % % 69  73  64  71   Post FEV1/FCV % % 69  73  70  72   FEV1-Pre L 2.37  2.51  2.40  2.85   FEV1-Predicted Pre % 66  69  65  77   FEV1-Post L 2.40  2.45  2.58  2.63   DLCO uncorrected ml/min/mmHg 24.33  28.29  25.55  28.61   DLCO UNC% % 85  98  88  73   DLCO corrected ml/min/mmHg 24.33  28.29     DLCO COR %Predicted % 85  98     DLVA Predicted % 98   108  108  83   TLC L 7.35  7.20  7.91  6.82   TLC % Predicted % 91  89  98  84   RV % Predicted % 117  104  139  91     Lab Results  Component Value Date   NITRICOXIDE 22 08/24/2017        Assessment & Plan:   Acute exacerbation of COPD with asthma ([place]) AECOPD/asthma secondary to pneumonia. He seems to have been poorly controlled for the past few months, likely due to being off antihistamine/singulair. He has allergic phenotype. Albuterol neb and depo 80 mg inj x 1 in office. We will treat him with cefpodoxime and prednisone taper. Maximize bronchodilator and mucociliary clearance therapies. Restart trigger prevention measures. Asthma/COPD action plan in place. VS stable and non-toxic appearing. Close follow up.  Patient Instructions  -Continue Albuterol inhaler 2 puffs or 3 mL neb every 6 hours as needed for shortness of breath or wheezing. Notify if symptoms persist despite rescue inhaler/neb use. -Continue Breztri 2 puffs Twice daily. Brush tongue and rinse mouth well afterwards. Use with spacer.  -Continue flonase 2 spray each nostril daily -Continue saline nasal spray 1 spray each nostril 1-2 times a day  -Continue on supplemental oxygen 2 lpm at night -Continue Mucinex 600 mg Twice daily for chest congestion -Continue Astelin nasal spray 2 sprays each nostril Twice daily   -Restart Xyzal (levocetirizine) 10 mg daily for allergies in the morning -Restart Singulair (montelukast) 10 mg At bedtime    Prednisone taper. 4 tabs for 2 days, then 3 tabs for 2 days, 2 tabs for 2 days, then 1 tab for 2 days, then stop. Take in AM with food.  Augmentin 1 tab Twice daily for 7 days. Take with food. Eat yogurt or take a daily probiotic while on this  Use your neb four times a day until symptoms improve  If your symptoms rapidly worsen, you have trouble talking or extreme difficulty breathing, or you're not getting relief from your nebulizer/rescue, go to the emergency department.  Labs  today   Follow up in 2 weeks with Dr. [name] or Alanson Aly. If symptoms do not improve or worsen, please contact office for sooner follow up or seek emergency care.   CAP (community acquired pneumonia) Left lung opacity, concerning for pneumonia given his infectious symptoms. See above plan. Repeat imaging in 6 weeks after completion of abx therapy. Strict return/ED precautions.   Allergic rhinitis He has been off of antihistamines/singulair for months now. Poorly controlled today. Reviewed the importance of compliance. Verbalized understanding. Advised him to get back in with allergist. See above plan.    I spent 45 minutes of dedicated to the care of this patient on the date of this encounter to include pre-visit review of records, face-to-face time with the patient discussing conditions above, post visit ordering of testing, clinical documentation with the electronic health record, making appropriate referrals as documented, and communicating necessary findings to members of the patients care team.  Noah Bibles, Noah Huffman 04/05/2022  Pt aware and understands Noah Huffman's  role.

## 2022-04-05 NOTE — Assessment & Plan Note (Addendum)
AECOPD/asthma secondary to pneumonia. He seems to have been poorly controlled for the past few months, likely due to being off antihistamine/singulair. He has allergic phenotype. Albuterol neb and depo 80 mg inj x 1 in office. We will treat him with cefpodoxime and prednisone taper. Maximize bronchodilator and mucociliary clearance therapies. Restart trigger prevention measures. Asthma/COPD action plan in place. VS stable and non-toxic appearing. Close follow up.  Patient Instructions  -Continue Albuterol inhaler 2 puffs or 3 mL neb every 6 hours as needed for shortness of breath or wheezing. Notify if symptoms persist despite rescue inhaler/neb use. -Continue Breztri 2 puffs Twice daily. Brush tongue and rinse mouth well afterwards. Use with spacer.  -Continue flonase 2 spray each nostril daily -Continue saline nasal spray 1 spray each nostril 1-2 times a day  -Continue on supplemental oxygen 2 lpm at night -Continue Mucinex 600 mg Twice daily for chest congestion -Continue Astelin nasal spray 2 sprays each nostril Twice daily   -Restart Xyzal (levocetirizine) 10 mg daily for allergies in the morning -Restart Singulair (montelukast) 10 mg At bedtime    Prednisone taper. 4 tabs for 2 days, then 3 tabs for 2 days, 2 tabs for 2 days, then 1 tab for 2 days, then stop. Take in AM with food.  Augmentin 1 tab Twice daily for 7 days. Take with food. Eat yogurt or take a daily probiotic while on this  Use your neb four times a day until symptoms improve  If your symptoms rapidly worsen, you have trouble talking or extreme difficulty breathing, or you're not getting relief from your nebulizer/rescue, go to the emergency department.  Labs today   Follow up in 2 weeks with Dr. [name] or Alanson Aly. If symptoms do not improve or worsen, please contact office for sooner follow up or seek emergency care.

## 2022-04-05 NOTE — Assessment & Plan Note (Signed)
Left lung opacity, concerning for pneumonia given his infectious symptoms. See above plan. Repeat imaging in 6 weeks after completion of abx therapy. Strict return/ED precautions.

## 2022-04-06 NOTE — Progress Notes (Signed)
[facility] Summary Report / Loop Recorder

## 2022-04-06 NOTE — Telephone Encounter (Signed)
LMOM for Pt to return call.  

## 2022-04-14 IMAGING — CT CT CHEST HIGH RESOLUTION W/O CM
2 of 8 series · 14 of 36 positions shown, 17 images · non-contrast
Comparison: 01/24/2019

CLINICAL DATA: Shortness of breath

EXAM:
CT CHEST WITHOUT CONTRAST
TECHNIQUE: Multidetector CT imaging of the chest was performed following the
standard protocol without intravenous contrast. High resolution
imaging of the lungs, as well as inspiratory and expiratory imaging,
was performed.

[Series 5: high resolution · axial · 0.83mm/px · z∈[-354,-102]mm · 11 of 304 slices shown, 14 images]
[im 26/304  mediastinal]
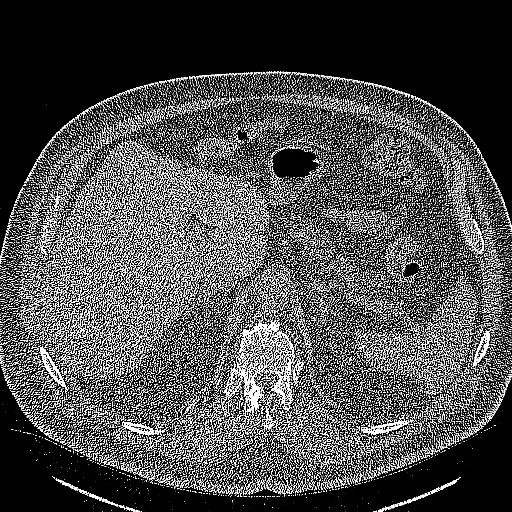
[im 26/304  lung]
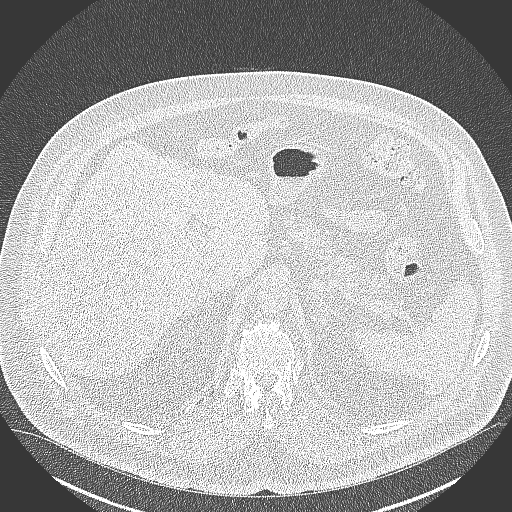
[im 51/304  lung]
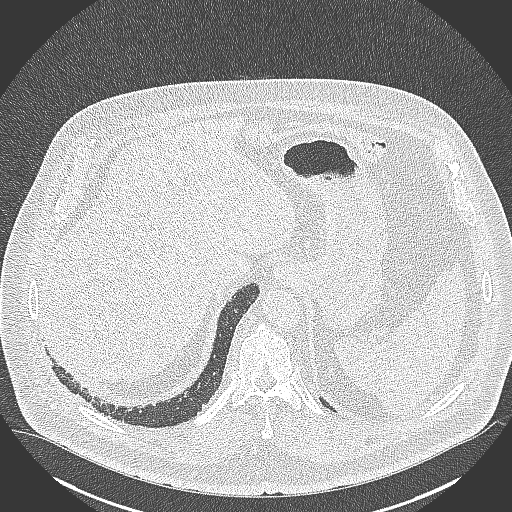
[im 76/304  lung]
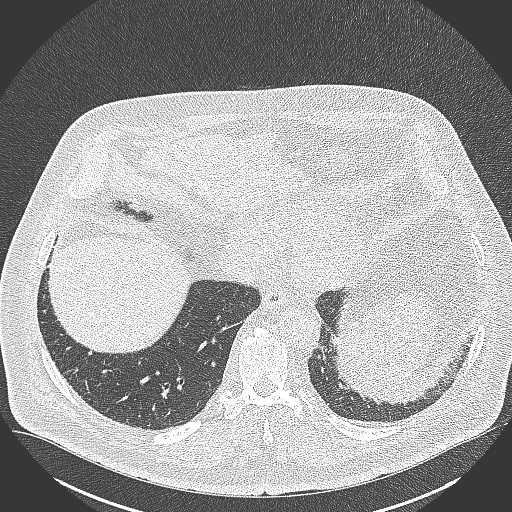
[im 102/304  lung]
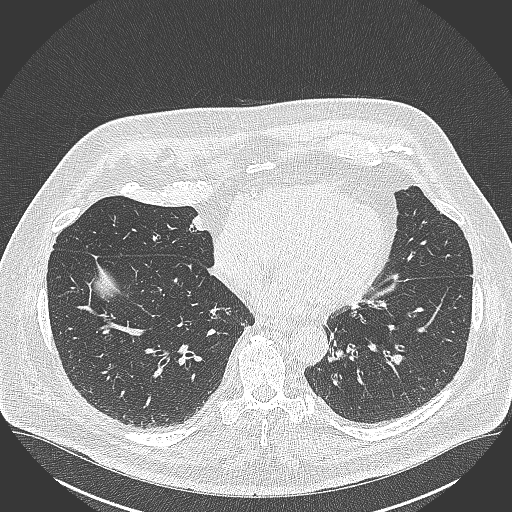
[im 127/304  mediastinal]
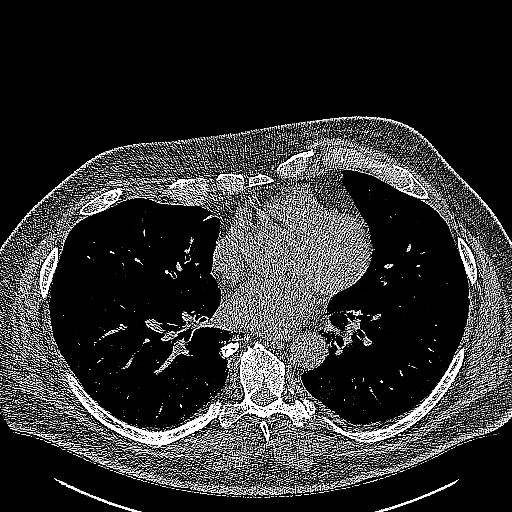
[im 127/304  lung]
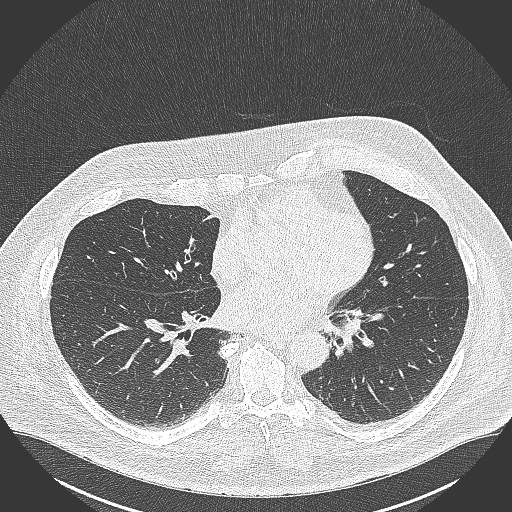
[im 152/304  lung]
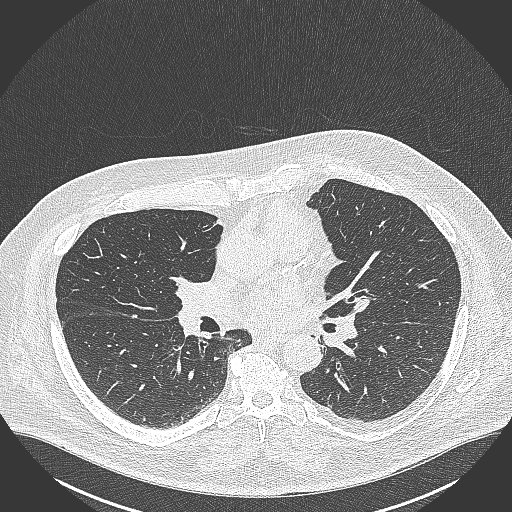
[im 177/304  lung]
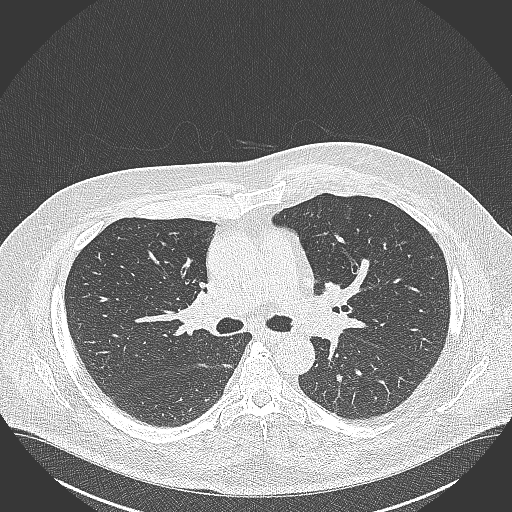
[im 203/304  lung]
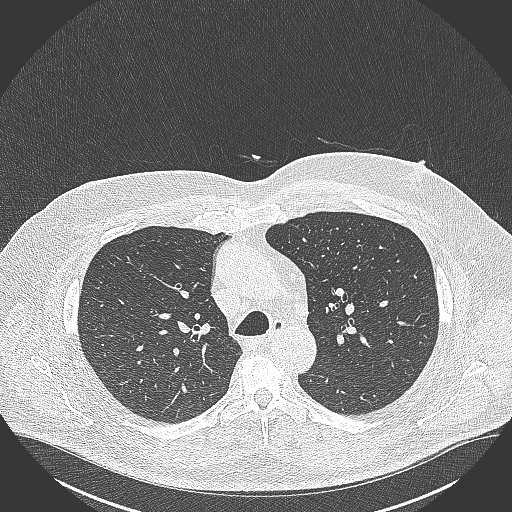
[im 228/304  mediastinal]
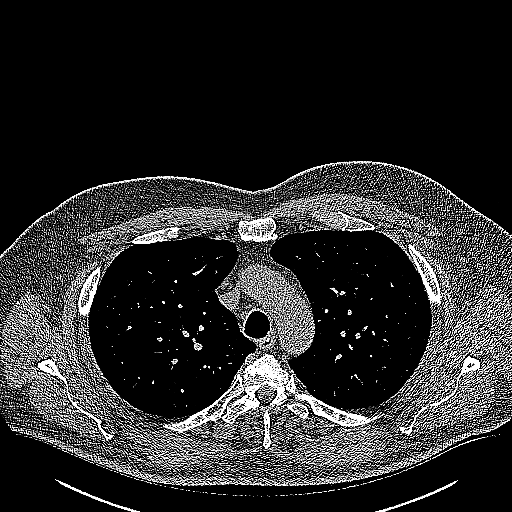
[im 228/304  lung]
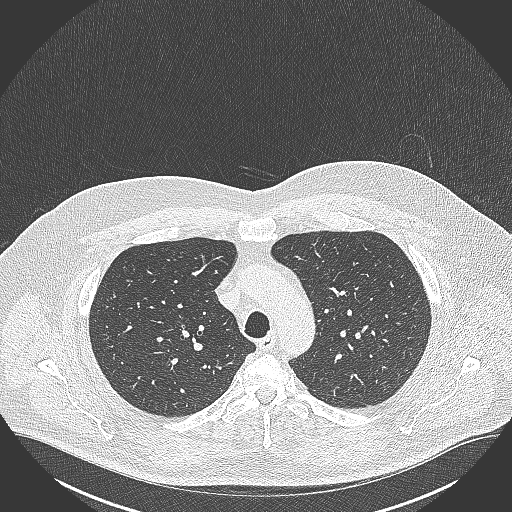
[im 253/304  lung]
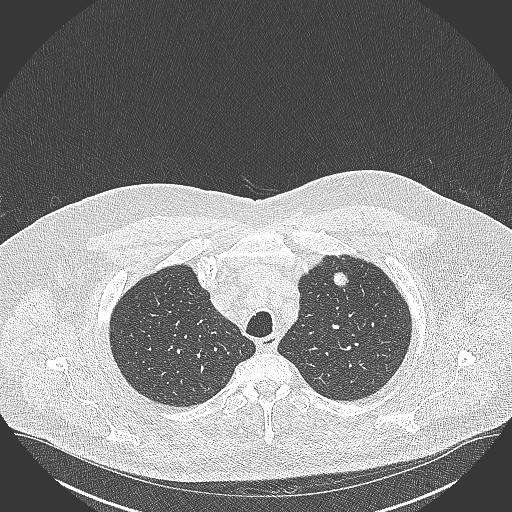
[im 278/304  lung]
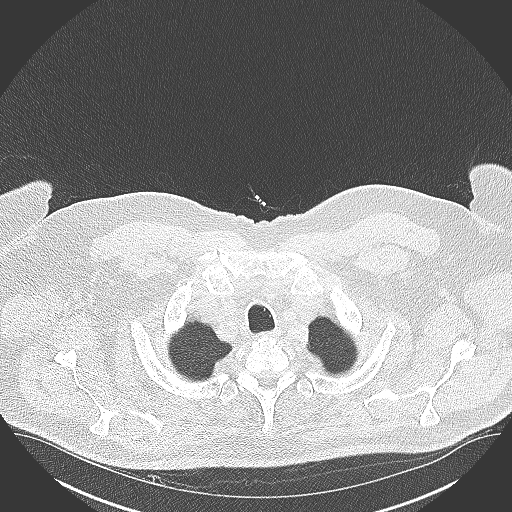

[Series 8: coronal · coronal · 0.63mm/px · 3 of 172 slices shown]
[im 35/172  lung]
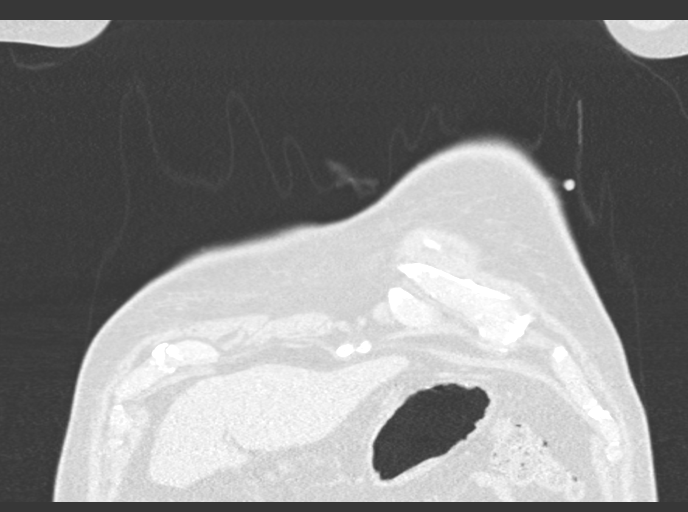
[im 69/172  lung]
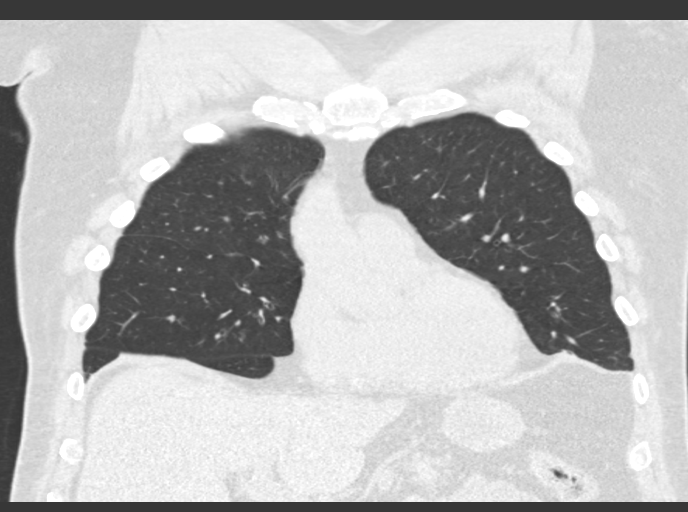
[im 103/172  lung]
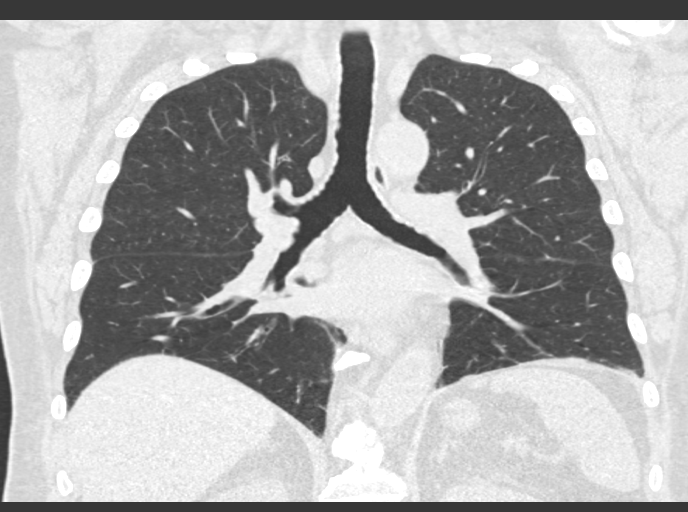

[14 of 36 positions shown; findings below may reference images not displayed]

FINDINGS: Cardiovascular: Aortic atherosclerosis. Normal heart size.
Three-vessel coronary artery calcifications. No pericardial
effusion.

Mediastinum/Nodes: No enlarged mediastinal, hilar, or axillary lymph
nodes. Thyroid gland, trachea, and esophagus demonstrate no
significant findings.

Lungs/Pleura: Mild, diffuse bilateral bronchial wall thickening. No
significant change in mild, bland appearing scarring and volume loss
of the left greater than right lung bases. No significant air
trapping on expiratory phase imaging. No pleural effusion or
pneumothorax.

Upper Abdomen: No acute abnormality.

Musculoskeletal: No chest wall mass or suspicious bone lesions
identified.
IMPRESSION: 1. No significant change in mild, bland appearing scarring and
volume loss of the left greater than right lung bases. No specific
evidence of fibrotic interstitial lung disease.
2. Mild, diffuse bilateral bronchial wall thickening, consistent
with nonspecific infectious or inflammatory bronchitis.
3. Coronary artery disease.  Aortic Atherosclerosis ([id number]).

## 2022-04-19 ENCOUNTER — Ambulatory Visit (INDEPENDENT_AMBULATORY_CARE_PROVIDER_SITE_OTHER): Payer: PPO | Admitting: Nurse Practitioner

## 2022-04-19 ENCOUNTER — Encounter: Payer: Self-pay | Admitting: Nurse Practitioner

## 2022-04-19 VITALS — BP 118/70 | HR 61 | Ht 75.0 in | Wt 278.0 lb

## 2022-04-19 DIAGNOSIS — H6123 Impacted cerumen, bilateral: Secondary | ICD-10-CM

## 2022-04-19 DIAGNOSIS — J4489 Other specified chronic obstructive pulmonary disease: Secondary | ICD-10-CM

## 2022-04-19 DIAGNOSIS — J189 Pneumonia, unspecified organism: Secondary | ICD-10-CM

## 2022-04-19 DIAGNOSIS — H6120 Impacted cerumen, unspecified ear: Secondary | ICD-10-CM | POA: Insufficient documentation

## 2022-04-19 DIAGNOSIS — J3089 Other allergic rhinitis: Secondary | ICD-10-CM | POA: Diagnosis not present

## 2022-04-19 NOTE — Progress Notes (Addendum)
$'@Patient'b$  ID: Noah Huffman, male    DOB: 03/05/1941, 82 y.o.   MRN: [id number]  Chief Complaint  Patient presents with   Follow-up    Pt f/u he is still having a productive cough he states it has gotten better, he used his neb for 3 days q12 hrs but he states he couldn't handle the side effects (jittery, lightheaded?    Referring provider: [name], MD  HPI: 82 year old male, former smoker (62.5-pack-year history) followed for COPD Gold 3, chronic cough and OSA.  He is a patient of Dr. Golden Pop and also followed by Dr. Annamaria Boots for sleep.  Last seen in office on 04/05/2022 by Breea Loncar NP.  Past medical history significant for history of asthma, hypertension, A-fib, GERD, OA, BPH, obesity, HLD.  TEST/EVENTS:  04/27/2013 and PSG: AHI 16.5/h, desaturation 87% 01/30/2020 HRCT chest: Atherosclerosis, CAD.  Mild diffuse bilateral bronchial wall thickening.  Mild band appearing scarring and volume loss of bases, left greater than right; similar in appearance when compared to previous. 09/03/2019 echo: On 2 L/min, showed sustained hypoxemia and bradycardia 12/24/2020 NPSG: AHI 9/h, desaturation 85% 02/18/2021 PFTs: FVC 3.46 (70), FEV1 2.4 (67, ratio 69, TLC 91%, DLCO uncorrected 85%.  Moderate obstructive airway disease with normal diffusion capacity.  No BD 11/01/2021 HRCT chest: atherosclerosis. Ascending aorta measures up to 4.2 cm. Enlarged pulmonic trunk. No ILD. Mild basilar scarring. Scattered mucoid impaction in the lower lobes. 3 mm nodule in the subpleural aspect of the superior segment RLL, unchanged. New 3 mm subpleural left upper lobe nodule, new. Mild air trapping.  04/05/2022: eos 800  01/24/2021: OV with Dr. Annamaria Boots for f/u after sleep study. Discussed results. Concerned about nasal mask d/t not much airflow from previous nasal fx. Also concerned about claustrophobia with full face mask. Tx recently by PCP for AECOPD with prednisone and abx. Continued Breztri and PRN albuterol. Pt decided to  wait to move forward with CPAP therapy or oral appliance until he discussed effects of untreated mild OSA on his a fib with his cardiologist. Continued on nocturnal 2 lpm supplemental O2.  05/19/2021: OV with Keairra Bardon NP.  Worsening shortness of breath and ongoing productive cough over the last 2 to 3 months.  Felt as though activity intolerance had declined.  Had been treated a few times for AECOPD by his PCP with prednisone and antibiotics over the last 6 months.  Also has some increasing allergy type symptoms including runny nose, itchy eyes and scratchy throat.  Started take over-the-counter allergy medicine from [facility] and uses Flonase and saline nasal spray.  Previously had met with Dr. Annamaria Boots to discuss mild OSA and treatment options.  Still unsure at this visit what he would like to do next.  Continued on 2 L/min supplemental O2 at night.  Continued on triple therapy regimen with Breztri.  CBC with differential and IgE obtained which showed significant eosinophilia and elevated IgE.  Started on Singulair and Xyzal.  Provided with short prednisone burst.  Plans to obtain FeNO at follow-up.  CXR without any evidence of superimposed infection  06/02/2021: OV with Christabella Alvira NP for follow-up.  Feels as though his breathing is slightly improved although he does still continue to experience shortness of breath from exertion.  Feels as though he still has activity limitations.  Does feel like his cough has improved and is not as frequent as it used to be.  Production has mostly resolved.  Does continue to have persistent allergy symptoms with itchy eyes and sneezing.  Started on Xyzal and Singulair at his last visit.  Denies lower extremity edema, orthopnea or PND, hemoptysis.  Has seen an allergist in the past.  Continued on Breztri twice daily.  Rarely uses albuterol. FeNO 23 ppb.   06/21/2021: OV with Dr. [name]. Better after March and starting singulair and xyzal. Seen Dr. Sequan Hedge in the past; would like to go back  to him. Referred to pulmonary rehab. Spiro/DLCO in 4 months. HRCT in 4 months.   04/05/2022: OV with Pierre Cumpton NP for acute visit. He was doing better after I saw him last. He went to pulmonary rehab as well, which he's not sure made a huge difference but he graduated in October. He did not continue to do the exercises afterwards. He's not sure if this is the reason why but he has been more short of breath over the last 2-3 months. Feels like he hasn't been able to complete activities around the house without getting out of breath. Then over the past two weeks, he started having more wheezing, chest congestion, and a productive cough with cream colored sputum, mostly in AM. He also has postnasal drainage and feels like he wakes up choked in the morning from the mucus. He denies fevers, chills, hemoptysis, orthopnea, PND, leg swelling. He is on [employer]. He uses his flonase and astelin daily. Takes mucinex twice a day. He is not still on xyzal or singulair. Not sure when he stopped these.  HRCT chest from August 2023 did not show any ILD/IPF. He did have some mucous plugging and mild air trapping.  FeNO 25 ppb. CXR with concern for opacity at left lung base, consistent with pna given infectious symptoms. Eosinophils remain significantly elevated on CBC at 800. Treated with augmentin, prednisone taper. Instructed to restart xyzal and singulair.   04/19/2022: Today - follow up Patient presents today for follow up after being treated for pneumonia and AECOPD/asthma. He has completed augmentin and prednisone courses. He is feeling much better. His breathing feelings back to his baseline. His cough is also improved. Still occasionally productive with white/cream sputum. It has decreased and is not as thick. Chest congestion and wheezing have resolved. His nasal symptoms are also better. He still feels like he has some pressure in his ears; no pain or drainage. Going to see his audiologist tomorrow. He did restart xyzal and  singulair since he was here last.   Allergies  Allergen Reactions   Macrobid [Nitrofurantoin]    Morphine And Related Rash    Immunization History  Administered Date(s) Administered   Fluad Quad(high Dose 65+) 12/02/2018, 12/24/2019   Influenza Split 04/13/2011, 03/20/2012, 04/18/2012   Influenza, Quadrivalent, Recombinant, Inj, Pf 12/24/2019   Influenza,inj,Quad PF,6+ Mos 01/14/2017   Influenza,inj,quad, With Preservative 12/11/2018   PFIZER(Purple Top)SARS-COV-2 Vaccination 05/15/2019, 06/10/2019   Pneumococcal Conjugate-13 02/28/2013, 04/21/2013, 05/06/2015   Pneumococcal Polysaccharide-23 01/23/2007, 05/09/2016   Td 03/21/2004, 08/25/2013, 08/25/2013   Unspecified SARS-COV-2 Vaccination 05/21/2019    Past Medical History:  Diagnosis Date   Allergy    seasonal   Asthma    Atrial fibrillation (HCC)    BPH (benign prostatic hypertrophy) with urinary obstruction    Cataract    removed both eyes   COPD (chronic obstructive pulmonary disease) ([place])    Diverticulosis    Dysrhythmia    GERD (gastroesophageal reflux disease)    Hyperglycemia    Hyperlipidemia    Hypertension    Inguinal hernia    Neuromuscular disorder (HCC)    neuropathy in  feet    Obesity    OSA (obstructive sleep apnea) 06/06/2013   Osteoarthritis    Sleep apnea    no cpap    Tobacco History: Social History   Tobacco Use  Smoking Status Former   Packs/day: 2.50   Years: 25.00   Total pack years: 62.50   Types: Cigarettes   Quit date: 03/20/1988   Years since quitting: 34.1  Smokeless Tobacco Never   Counseling given: Not Answered   Outpatient Medications Prior to Visit  Medication Sig Dispense Refill   albuterol (VENTOLIN HFA) 108 (90 Base) MCG/ACT inhaler Inhale 2 puffs into the lungs every 6 (six) hours as needed for wheezing or shortness of breath. 8 g 6   aspirin EC 81 MG tablet Take 1 tablet (81 mg total) by mouth daily. Swallow whole. 90 tablet 3   azelastine (ASTELIN) 0.1 % nasal  spray Place 2 sprays into both nostrils 2 (two) times daily. Use in each nostril as directed 30 mL 3   BREZTRI AEROSPHERE 160-9-4.8 MCG/ACT AERO INHALE 2 PUFFS BY MOUTH INTO THE LUNGS IN THE MORNING AND AT BEDTIME 10.7 g 5   fluticasone (FLONASE) 50 MCG/ACT nasal spray Place 1 spray into both nostrils daily.     gabapentin (NEURONTIN) 600 MG tablet Take 1,800 mg by mouth 3 (three) times daily.     guaiFENesin (MUCINEX) 600 MG 12 hr tablet Take 1 tablet (600 mg total) by mouth 2 (two) times daily. 60 tablet 3   levocetirizine (XYZAL) 5 MG tablet Take 1 tablet (5 mg total) by mouth every evening. 90 tablet 1   losartan-hydrochlorothiazide (HYZAAR) 100-25 MG tablet Take 1 tablet by mouth daily.     montelukast (SINGULAIR) 10 MG tablet Take 1 tablet (10 mg total) by mouth at bedtime. 30 tablet 5   Multiple Vitamins-Minerals (PRESERVISION AREDS 2) CAPS Take 1 capsule by mouth daily.      polyvinyl alcohol (LIQUIFILM TEARS) 1.4 % ophthalmic solution Place 1 drop into both eyes 2 (two) times daily.     rosuvastatin (CRESTOR) 10 MG tablet Take 10 mg by mouth at bedtime.     sodium chloride (OCEAN) 0.65 % SOLN nasal spray Place 1 spray into both nostrils daily.     tamsulosin (FLOMAX) 0.4 MG CAPS capsule Take 0.4 mg by mouth daily.     predniSONE (DELTASONE) 10 MG tablet 4 tabs for 2 days, then 3 tabs for 2 days, 2 tabs for 2 days, then 1 tab for 2 days, then stop 20 tablet 0   Facility-Administered Medications Prior to Visit  Medication Dose Route Frequency Provider Last Rate Last Admin   0.9 %  sodium chloride infusion  500 mL Intravenous Once Irene Shipper, MD       albuterol (PROVENTIL) (2.5 MG/3ML) 0.083% nebulizer solution 2.5 mg  2.5 mg Nebulization Q6H PRN Divante Kotch, Karie Schwalbe, NP       methylPREDNISolone acetate (DEPO-MEDROL) injection 80 mg  80 mg Intramuscular Once Clotilde Loth, Karie Schwalbe, NP         Review of Systems:   Constitutional: No weight loss or gain, night sweats, fevers, chills, fatigue,  or lassitude. HEENT: No headaches, difficulty swallowing, tooth/dental problems, or sore throat. No sneezing,ear ache, nasal drainage, postnasal drainage. +ear pressure, nasal congestion (improved) CV:  No chest pain, orthopnea, PND, swelling in lower extremities, anasarca, dizziness, palpitations, syncope Resp: +shortness of breath with exertion (improved); productive cough (improving). No wheezing or chest congestion. No hemoptysis.  No chest wall deformity  GI:  No heartburn, indigestion, abdominal pain, nausea, vomiting, diarrhea, change in bowel habits, loss of appetite, bloody stools.  GU: No dysuria, change in color of urine, urgency or frequency.   Skin: No rash, lesions, ulcerations MSK:  No joint pain or swelling.   Neuro: No dizziness or lightheadedness.  Psych: No depression or anxiety. Mood stable.     Physical Exam:  BP 118/70   Pulse 61   Ht '6\' 3"'$  (1.905 m)   Wt 278 lb (126.1 kg)   SpO2 96%   BMI 34.75 kg/m   GEN: Pleasant, interactive, well-appearing; obese; in no acute distress. HEENT:  Normocephalic and atraumatic. PERRLA. EACs with cerumen impaction b/l. Sclera white. Nasal turbinates boggy, moist and patent bilaterally.  No rhinorrhea present. Oropharynx pink and moist, without exudate or edema. No lesions, ulcerations.  NECK:  Supple w/ fair ROM. No JVD present. Normal carotid impulses w/o bruits. Thyroid symmetrical with no goiter or nodules palpated. No lymphadenopathy.   CV: RRR, no m/r/g, no peripheral edema. Pulses intact, +2 bilaterally. No cyanosis, pallor or clubbing. PULMONARY:  Unlabored, regular breathing. Clear bilaterally A&P w/o wheezes/rales/rhonchi. No accessory muscle use. No dullness to percussion. GI: BS present and normoactive. Soft, non-tender to palpation. No organomegaly or masses detected.  MSK: No erythema, warmth or tenderness. Cap refil <2 sec all extrem. No deformities or joint swelling noted.  Neuro: A/Ox3. No focal deficits noted.    Skin: Warm, no lesions or rashe Psych: Normal affect and behavior. Judgement and thought content appropriate.     Lab Results:  CBC    Component Value Date/Time   WBC 8.8 04/05/2022 1220   RBC 4.52 04/05/2022 1220   HGB 14.4 04/05/2022 1220   HGB 14.1 12/31/2019 1004   HCT 41.6 04/05/2022 1220   HCT 41.9 12/31/2019 1004   PLT 229.0 04/05/2022 1220   PLT 204 12/31/2019 1004   MCV 92.0 04/05/2022 1220   MCV 91 12/31/2019 1004   MCH 31.4 03/23/2020 1140   MCHC 34.6 04/05/2022 1220   RDW 13.7 04/05/2022 1220   RDW 14.6 12/31/2019 1004   LYMPHSABS 2.5 04/05/2022 1220   LYMPHSABS 2.3 12/31/2019 1004   MONOABS 0.7 04/05/2022 1220   EOSABS 0.8 (H) 04/05/2022 1220   EOSABS 0.4 12/31/2019 1004   BASOSABS 0.1 04/05/2022 1220   BASOSABS 0.0 12/31/2019 1004    BMET    Component Value Date/Time   NA 140 05/19/2021 1130   NA 140 12/31/2019 1004   K 3.7 05/19/2021 1130   CL 101 05/19/2021 1130   CO2 32 05/19/2021 1130   GLUCOSE 127 (H) 05/19/2021 1130   BUN 18 05/19/2021 1130   BUN 21 12/31/2019 1004   CREATININE 0.90 05/19/2021 1130   CALCIUM 9.4 05/19/2021 1130   GFRNONAA >60 03/23/2020 1140   GFRAA 102 12/31/2019 1004    BNP No results found for: "BNP"   Imaging:  DG Chest 2 View  Result Date: 04/05/2022 CLINICAL DATA:  COPD, asthma exacerbation EXAM: CHEST - 2 VIEW COMPARISON:  Chest radiograph 05/19/2021, CT chest 11/01/2021 FINDINGS: A left chest wall cardiac recorder device is noted. The cardiomediastinal silhouette is stable and within normal limits. Linear opacities in the left lung base likely reflects subsegmental atelectasis. Otherwise, there is no focal consolidation or pulmonary edema. There is no pleural effusion or pneumothorax There is no acute osseous abnormality. Right shoulder arthroplasty hardware is noted. IMPRESSION: Probable left basilar subsegmental atelectasis. Otherwise, no radiographic evidence of acute cardiopulmonary process. Electronically  Signed   By: Valetta Mole M.D.   On: 04/05/2022 12:07   CUP PACEART REMOTE DEVICE CHECK  Result Date: 04/04/2022 ILR summary report received. Battery status OK. Normal device function. No new symptom, tachy, brady, or pause episodes. No new AF episodes. Monthly summary reports and ROV/PRN LA        Latest Ref Rng & Units 02/18/2021   11:18 AM 03/15/2020   11:59 AM 01/21/2019    1:47 PM 10/08/2017   11:44 AM  PFT Results  FVC-Pre L 3.43  3.44  3.73  4.03   FVC-Predicted Pre % 69  69  74  79   FVC-Post L 3.46  3.37  3.69  3.68   FVC-Predicted Post % 70  67  73  72   Pre FEV1/FVC % % 69  73  64  71   Post FEV1/FCV % % 69  73  70  72   FEV1-Pre L 2.37  2.51  2.40  2.85   FEV1-Predicted Pre % 66  69  65  77   FEV1-Post L 2.40  2.45  2.58  2.63   DLCO uncorrected ml/min/mmHg 24.33  28.29  25.55  28.61   DLCO UNC% % 85  98  88  73   DLCO corrected ml/min/mmHg 24.33  28.29     DLCO COR %Predicted % 85  98     DLVA Predicted % 98  108  108  83   TLC L 7.35  7.20  7.91  6.82   TLC % Predicted % 91  89  98  84   RV % Predicted % 117  104  139  91     Lab Results  Component Value Date   NITRICOXIDE 22 08/24/2017        Assessment & Plan:   COPD with asthma COPD with asthma. Resolved exacerbation. Clinically improved. Continue triple therapy regimen. Again reviewed the role of singulair for trigger prevention.   Patient Instructions  -Continue Albuterol inhaler 2 puffs or 3 mL neb every 6 hours as needed for shortness of breath or wheezing. Notify if symptoms persist despite rescue inhaler/neb use. -Continue Breztri 2 puffs Twice daily. Brush tongue and rinse mouth well afterwards. Use with spacer.  -Continue flonase 2 spray each nostril daily -Continue saline nasal spray 1 spray each nostril 1-2 times a day  -Continue on supplemental oxygen 2 lpm at night -Continue Mucinex 600 mg Twice daily as needed for chest congestion -Continue Astelin nasal spray 2 sprays each nostril  Twice daily as needed for sinus congestion or nasal drainage  -Continue Xyzal (levocetirizine) 10 mg daily for allergies in the morning -Continue Singulair (montelukast) 10 mg At bedtime    Follow up in 6 weeks with Dr. [name] or Alanson Aly and repeat chest x ray. If symptoms do not improve or worsen, please contact office for sooner follow up or seek emergency care.   CAP (community acquired pneumonia) LLL pna. Completed augmentin course. Clinically improved. Plan to repeat imaging in 6 weeks to ensure resolution.   Allergic rhinitis Improved nasal symptoms. Encouraged him to remain on xyzal and singulair given his allergic phenotype. He verbalized understanding.   Cerumen impaction Likely causing sensation of ear pressure/fullness. He is seeing audiologist tomorrow who will perform extraction.     I spent 28 minutes of dedicated to the care of this patient on the date of this encounter to include pre-visit review of records, face-to-face time with the patient discussing conditions  above, post visit ordering of testing, clinical documentation with the electronic health record, making appropriate referrals as documented, and communicating necessary findings to members of the patients care team.  Clayton Bibles, NP 04/19/2022  Pt aware and understands NP's role.

## 2022-04-19 NOTE — Patient Instructions (Addendum)
-  Continue Albuterol inhaler 2 puffs or 3 mL neb every 6 hours as needed for shortness of breath or wheezing. Notify if symptoms persist despite rescue inhaler/neb use. -Continue Breztri 2 puffs Twice daily. Brush tongue and rinse mouth well afterwards. Use with spacer.  -Continue flonase 2 spray each nostril daily -Continue saline nasal spray 1 spray each nostril 1-2 times a day  -Continue on supplemental oxygen 2 lpm at night -Continue Mucinex 600 mg Twice daily as needed for chest congestion -Continue Astelin nasal spray 2 sprays each nostril Twice daily as needed for sinus congestion or nasal drainage  -Continue Xyzal (levocetirizine) 10 mg daily for allergies in the morning -Continue Singulair (montelukast) 10 mg At bedtime    Follow up in 6 weeks with Dr. [name] or Alanson Aly and repeat chest x ray. If symptoms do not improve or worsen, please contact office for sooner follow up or seek emergency care.

## 2022-04-19 NOTE — Assessment & Plan Note (Signed)
COPD with asthma. Resolved exacerbation. Clinically improved. Continue triple therapy regimen. Again reviewed the role of singulair for trigger prevention.   Patient Instructions  -Continue Albuterol inhaler 2 puffs or 3 mL neb every 6 hours as needed for shortness of breath or wheezing. Notify if symptoms persist despite rescue inhaler/neb use. -Continue Breztri 2 puffs Twice daily. Brush tongue and rinse mouth well afterwards. Use with spacer.  -Continue flonase 2 spray each nostril daily -Continue saline nasal spray 1 spray each nostril 1-2 times a day  -Continue on supplemental oxygen 2 lpm at night -Continue Mucinex 600 mg Twice daily as needed for chest congestion -Continue Astelin nasal spray 2 sprays each nostril Twice daily as needed for sinus congestion or nasal drainage  -Continue Xyzal (levocetirizine) 10 mg daily for allergies in the morning -Continue Singulair (montelukast) 10 mg At bedtime    Follow up in 6 weeks with Dr. [name] or Alanson Aly and repeat chest x ray. If symptoms do not improve or worsen, please contact office for sooner follow up or seek emergency care.

## 2022-04-19 NOTE — Assessment & Plan Note (Signed)
Improved nasal symptoms. Encouraged him to remain on xyzal and singulair given his allergic phenotype. He verbalized understanding.

## 2022-04-19 NOTE — Assessment & Plan Note (Signed)
Likely causing sensation of ear pressure/fullness. He is seeing audiologist tomorrow who will perform extraction.

## 2022-04-19 NOTE — Assessment & Plan Note (Signed)
LLL pna. Completed augmentin course. Clinically improved. Plan to repeat imaging in 6 weeks to ensure resolution.

## 2022-04-24 DIAGNOSIS — Z0110 Encounter for examination of ears and hearing without abnormal findings: Secondary | ICD-10-CM | POA: Diagnosis not present

## 2022-04-24 DIAGNOSIS — H938X3 Other specified disorders of ear, bilateral: Secondary | ICD-10-CM | POA: Diagnosis not present

## 2022-04-24 DIAGNOSIS — H73892 Other specified disorders of tympanic membrane, left ear: Secondary | ICD-10-CM | POA: Diagnosis not present

## 2022-05-07 LAB — CUP PACEART REMOTE DEVICE CHECK
Date Time Interrogation Session: 20240214231426
Implantable Pulse Generator Implant Date: 20211122

## 2022-05-08 ENCOUNTER — Ambulatory Visit: Payer: PPO

## 2022-05-08 DIAGNOSIS — I483 Typical atrial flutter: Secondary | ICD-10-CM | POA: Diagnosis not present

## 2022-05-15 NOTE — Progress Notes (Signed)
[facility] Summary Report / Loop Recorder 

## 2022-05-24 ENCOUNTER — Other Ambulatory Visit: Payer: Self-pay | Admitting: Internal Medicine

## 2022-05-30 DIAGNOSIS — R7989 Other specified abnormal findings of blood chemistry: Secondary | ICD-10-CM | POA: Diagnosis not present

## 2022-05-30 DIAGNOSIS — E785 Hyperlipidemia, unspecified: Secondary | ICD-10-CM | POA: Diagnosis not present

## 2022-05-30 DIAGNOSIS — R739 Hyperglycemia, unspecified: Secondary | ICD-10-CM | POA: Diagnosis not present

## 2022-05-30 DIAGNOSIS — K219 Gastro-esophageal reflux disease without esophagitis: Secondary | ICD-10-CM | POA: Diagnosis not present

## 2022-05-30 DIAGNOSIS — I10 Essential (primary) hypertension: Secondary | ICD-10-CM | POA: Diagnosis not present

## 2022-05-30 DIAGNOSIS — Z125 Encounter for screening for malignant neoplasm of prostate: Secondary | ICD-10-CM | POA: Diagnosis not present

## 2022-05-31 ENCOUNTER — Ambulatory Visit (INDEPENDENT_AMBULATORY_CARE_PROVIDER_SITE_OTHER): Payer: PPO

## 2022-05-31 ENCOUNTER — Encounter: Payer: Self-pay | Admitting: Nurse Practitioner

## 2022-05-31 ENCOUNTER — Ambulatory Visit: Payer: PPO | Admitting: Nurse Practitioner

## 2022-05-31 VITALS — BP 128/76 | HR 67 | Ht 75.0 in | Wt 274.5 lb

## 2022-05-31 DIAGNOSIS — R918 Other nonspecific abnormal finding of lung field: Secondary | ICD-10-CM | POA: Diagnosis not present

## 2022-05-31 DIAGNOSIS — J45901 Unspecified asthma with (acute) exacerbation: Secondary | ICD-10-CM

## 2022-05-31 DIAGNOSIS — J3089 Other allergic rhinitis: Secondary | ICD-10-CM

## 2022-05-31 DIAGNOSIS — J441 Chronic obstructive pulmonary disease with (acute) exacerbation: Secondary | ICD-10-CM | POA: Diagnosis not present

## 2022-05-31 DIAGNOSIS — J189 Pneumonia, unspecified organism: Secondary | ICD-10-CM

## 2022-05-31 LAB — POCT EXHALED NITRIC OXIDE: FeNO level (ppb): 18

## 2022-05-31 MED ORDER — DOXYCYCLINE HYCLATE 100 MG PO TABS: 100.0000 mg | ORAL_TABLET | Freq: Two times a day (BID) | ORAL | 0 refills | Status: AC

## 2022-05-31 MED ORDER — AIRSUPRA 90-80 MCG/ACT IN AERO: 1.0000 | INHALATION_SPRAY | RESPIRATORY_TRACT | 5 refills | Status: AC | PRN

## 2022-05-31 MED ORDER — PREDNISONE 10 MG PO TABS: ORAL_TABLET | ORAL | 0 refills | Status: DC

## 2022-05-31 NOTE — Assessment & Plan Note (Signed)
Well-controlled symptoms. He will continue nasal sprays, xyzal and singulair

## 2022-05-31 NOTE — Patient Instructions (Addendum)
-  We will try changing your rescue inhaler to one with an inhaled steroid in it called Airsupra - 1-2 puffs every 4 hours as needed for shortness of breath, wheezing or chest tightness. This will take the place of your current albuterol inhaler  -Continue Albuterol 3 mL neb every 6 hours as needed for shortness of breath or wheezing. Notify if symptoms persist despite rescue inhaler/neb use. -Continue Breztri 2 puffs Twice daily. Brush tongue and rinse mouth well afterwards. Use with spacer.  -Continue flonase 2 spray each nostril daily -Continue saline nasal spray 1 spray each nostril 1-2 times a day  -Continue on supplemental oxygen 2 lpm at night -Continue Mucinex 600 mg Twice daily as needed for chest congestion -Continue Astelin nasal spray 2 sprays each nostril Twice daily as needed for sinus congestion or nasal drainage  -Continue Xyzal (levocetirizine) 10 mg daily for allergies in the morning -Continue Singulair (montelukast) 10 mg At bedtime   Prednisone taper. 4 tabs for 2 days, then 3 tabs for 2 days, 2 tabs for 2 days, then 1 tab for 2 days, then stop. Take in AM with food Doxycycline 1 tab Twice daily for 7 days. Take with food and full glass of water. Wear sunscreen when outside as this increases your risk for sunburns   I'll call you about your x ray results   We may consider starting you on an injectable medicine to help manage your COPD/asthma at your next visit    Follow up in 2 weeks with Dr. [name] or Alanson Aly. If symptoms do not improve or worsen, please contact office for sooner follow up or seek emergency care

## 2022-05-31 NOTE — Progress Notes (Addendum)
$'@Patient'M$  ID: Noah Huffman, male    DOB: 17-Nov-1940, 82 y.o.   MRN: [id number]  Chief Complaint  Patient presents with   Follow-up    Pt f/u he states that he was okay until until 2/28 and it has slowly gotten worse. Syms are increased SOB, hoarseness, wheezing. He has been using Breztri as prescribed and used rescue inhale 2xs in the last week     Referring provider: [name], MD  HPI: 82 year old male, former smoker (62.5-pack-year history) followed for COPD Gold 3, chronic cough and OSA.  He is a patient of Dr. Golden Pop and also followed by Dr. Annamaria Boots for sleep.  Last seen in office on 04/19/2022 by [facility] NP.  Past medical history significant for history of asthma, hypertension, A-fib, GERD, OA, BPH, obesity, HLD.  TEST/EVENTS:  04/27/2013 and PSG: AHI 16.5/h, desaturation 87% 01/30/2020 HRCT chest: Atherosclerosis, CAD.  Mild diffuse bilateral bronchial wall thickening.  Mild band appearing scarring and volume loss of bases, left greater than right; similar in appearance when compared to previous. 09/03/2019 echo: On 2 L/min, showed sustained hypoxemia and bradycardia 12/24/2020 NPSG: AHI 9/h, desaturation 85% 02/18/2021 PFTs: FVC 3.46 (70), FEV1 2.4 (67, ratio 69, TLC 91%, DLCO uncorrected 85%.  Moderate obstructive airway disease with normal diffusion capacity.  No BD 11/01/2021 HRCT chest: atherosclerosis. Ascending aorta measures up to 4.2 cm. Enlarged pulmonic trunk. No ILD. Mild basilar scarring. Scattered mucoid impaction in the lower lobes. 3 mm nodule in the subpleural aspect of the superior segment RLL, unchanged. New 3 mm subpleural left upper lobe nodule, new. Mild air trapping.  04/05/2022: eos 800  01/24/2021: OV with Dr. Annamaria Boots for f/u after sleep study. Discussed results. Concerned about nasal mask d/t not much airflow from previous nasal fx. Also concerned about claustrophobia with full face mask. Tx recently by PCP for AECOPD with prednisone and abx. Continued Breztri and  PRN albuterol. Pt decided to wait to move forward with CPAP therapy or oral appliance until he discussed effects of untreated mild OSA on his a fib with his cardiologist. Continued on nocturnal 2 lpm supplemental O2.  05/19/2021: OV with Sharisa Toves NP.  Worsening shortness of breath and ongoing productive cough over the last 2 to 3 months.  Felt as though activity intolerance had declined.  Had been treated a few times for AECOPD by his PCP with prednisone and antibiotics over the last 6 months.  Also has some increasing allergy type symptoms including runny nose, itchy eyes and scratchy throat.  Started take over-the-counter allergy medicine from [facility] and uses Flonase and saline nasal spray.  Previously had met with Dr. Annamaria Boots to discuss mild OSA and treatment options.  Still unsure at this visit what he would like to do next.  Continued on 2 L/min supplemental O2 at night.  Continued on triple therapy regimen with Breztri.  CBC with differential and IgE obtained which showed significant eosinophilia and elevated IgE.  Started on Singulair and Xyzal.  Provided with short prednisone burst.  Plans to obtain FeNO at follow-up.  CXR without any evidence of superimposed infection  06/02/2021: OV with Andree Golphin NP for follow-up.  Feels as though his breathing is slightly improved although he does still continue to experience shortness of breath from exertion.  Feels as though he still has activity limitations.  Does feel like his cough has improved and is not as frequent as it used to be.  Production has mostly resolved.  Does continue to have persistent allergy symptoms with  itchy eyes and sneezing.  Started on Xyzal and Singulair at his last visit.  Denies lower extremity edema, orthopnea or PND, hemoptysis.  Has seen an allergist in the past.  Continued on Breztri twice daily.  Rarely uses albuterol. FeNO 23 ppb.   06/21/2021: OV with Dr. [name]. Better after March and starting singulair and xyzal. Seen Dr. Destyn Hedge in the  past; would like to go back to him. Referred to pulmonary rehab. Spiro/DLCO in 4 months. HRCT in 4 months.   04/05/2022: OV with Cedar Ditullio NP for acute visit. He was doing better after I saw him last. He went to pulmonary rehab as well, which he's not sure made a huge difference but he graduated in October. He did not continue to do the exercises afterwards. He's not sure if this is the reason why but he has been more short of breath over the last 2-3 months. Feels like he hasn't been able to complete activities around the house without getting out of breath. Then over the past two weeks, he started having more wheezing, chest congestion, and a productive cough with cream colored sputum, mostly in AM. He also has postnasal drainage and feels like he wakes up choked in the morning from the mucus. He denies fevers, chills, hemoptysis, orthopnea, PND, leg swelling. He is on [employer]. He uses his flonase and astelin daily. Takes mucinex twice a day. He is not still on xyzal or singulair. Not sure when he stopped these.  HRCT chest from August 2023 did not show any ILD/IPF. He did have some mucous plugging and mild air trapping.  FeNO 25 ppb. CXR with concern for opacity at left lung base, consistent with pna given infectious symptoms. Eosinophils remain significantly elevated on CBC at 800. Treated with augmentin, prednisone taper. Instructed to restart xyzal and singulair.   04/19/2022: Ov with Jocelynne Duquette NP for follow up after being treated for pneumonia and AECOPD/asthma. He has completed augmentin and prednisone courses. He is feeling much better. His breathing feelings back to his baseline. His cough is also improved. Still occasionally productive with white/cream sputum. It has decreased and is not as thick. Chest congestion and wheezing have resolved. His nasal symptoms are also better. He still feels like he has some pressure in his ears; no pain or drainage. Going to see his audiologist tomorrow. He did restart xyzal  and singulair since he was here last.   05/31/2022: Today - follow up Patient presents today for follow up but also having some acute symptoms. He thinks it may be due to allergies but over the past weeks, he's been more short of breath and has a productive cough with brown sputum. Noticing some wheezing. Having more chest congestion/tightness. Denies fevers, chills, URI symptoms, hemoptysis, leg swelling, orthopnea. He has used his Judithann Sauger an extra time for the past few days. Feels like this helps more than his albuterol does. Taking singulair and xyzal.   FeNO 18 ppb  Allergies  Allergen Reactions   Macrobid [Nitrofurantoin]    Morphine And Related Rash    Immunization History  Administered Date(s) Administered   Fluad Quad(high Dose 65+) 12/02/2018, 12/24/2019   Influenza Split 04/13/2011, 03/20/2012, 04/18/2012   Influenza, Quadrivalent, Recombinant, Inj, Pf 12/24/2019   Influenza,inj,Quad PF,6+ Mos 01/14/2017   Influenza,inj,quad, With Preservative 12/11/2018   PFIZER(Purple Top)SARS-COV-2 Vaccination 05/15/2019, 06/10/2019   Pneumococcal Conjugate-13 02/28/2013, 04/21/2013, 05/06/2015   Pneumococcal Polysaccharide-23 01/23/2007, 05/09/2016   Td 03/21/2004, 08/25/2013, 08/25/2013   Unspecified SARS-COV-2 Vaccination 05/21/2019  Past Medical History:  Diagnosis Date   Allergy    seasonal   Asthma    Atrial fibrillation (HCC)    BPH (benign prostatic hypertrophy) with urinary obstruction    Cataract    removed both eyes   COPD (chronic obstructive pulmonary disease) (HCC)    Diverticulosis    Dysrhythmia    GERD (gastroesophageal reflux disease)    Hyperglycemia    Hyperlipidemia    Hypertension    Inguinal hernia    Neuromuscular disorder (HCC)    neuropathy in feet    Obesity    OSA (obstructive sleep apnea) 06/06/2013   Osteoarthritis    Sleep apnea    no cpap    Tobacco History: Social History   Tobacco Use  Smoking Status Former   Packs/day: 2.50    Years: 25.00   Total pack years: 62.50   Types: Cigarettes   Quit date: 03/20/1988   Years since quitting: 34.2  Smokeless Tobacco Never   Counseling given: Not Answered   Outpatient Medications Prior to Visit  Medication Sig Dispense Refill   aspirin EC 81 MG tablet Take 1 tablet (81 mg total) by mouth daily. Swallow whole. 90 tablet 3   azelastine (ASTELIN) 0.1 % nasal spray Place 2 sprays into both nostrils 2 (two) times daily. Use in each nostril as directed 30 mL 3   BREZTRI AEROSPHERE 160-9-4.8 MCG/ACT AERO INHALE 2 PUFFS BY MOUTH IN THE MORNING AND AT BEDTIME 10.7 g 5   fluticasone (FLONASE) 50 MCG/ACT nasal spray Place 1 spray into both nostrils daily.     gabapentin (NEURONTIN) 600 MG tablet Take 1,800 mg by mouth 3 (three) times daily.     levocetirizine (XYZAL) 5 MG tablet Take 1 tablet (5 mg total) by mouth every evening. 90 tablet 1   losartan-hydrochlorothiazide (HYZAAR) 100-25 MG tablet Take 1 tablet by mouth daily.     montelukast (SINGULAIR) 10 MG tablet Take 1 tablet (10 mg total) by mouth at bedtime. 30 tablet 5   Multiple Vitamins-Minerals (PRESERVISION AREDS 2) CAPS Take 1 capsule by mouth daily.      polyvinyl alcohol (LIQUIFILM TEARS) 1.4 % ophthalmic solution Place 1 drop into both eyes 2 (two) times daily.     rosuvastatin (CRESTOR) 10 MG tablet Take 10 mg by mouth at bedtime.     sodium chloride (OCEAN) 0.65 % SOLN nasal spray Place 1 spray into both nostrils daily.     tamsulosin (FLOMAX) 0.4 MG CAPS capsule Take 0.4 mg by mouth daily.     albuterol (VENTOLIN HFA) 108 (90 Base) MCG/ACT inhaler Inhale 2 puffs into the lungs every 6 (six) hours as needed for wheezing or shortness of breath. 8 g 6   guaiFENesin (MUCINEX) 600 MG 12 hr tablet Take 1 tablet (600 mg total) by mouth 2 (two) times daily. (Patient not taking: Reported on 05/31/2022) 60 tablet 3   Facility-Administered Medications Prior to Visit  Medication Dose Route Frequency Provider Last Rate Last Admin    0.9 %  sodium chloride infusion  500 mL Intravenous Once Irene Shipper, MD       albuterol (PROVENTIL) (2.5 MG/3ML) 0.083% nebulizer solution 2.5 mg  2.5 mg Nebulization Q6H PRN Rickell Wiehe, Karie Schwalbe, NP       methylPREDNISolone acetate (DEPO-MEDROL) injection 80 mg  80 mg Intramuscular Once Aniesha Haughn, Karie Schwalbe, NP         Review of Systems:   Constitutional: No weight loss or gain, night sweats, fevers, chills,  fatigue, or lassitude. HEENT: No headaches, difficulty swallowing, tooth/dental problems, or sore throat. No sneezing,ear ache, nasal drainage, postnasal drainage, ear pressure, nasal congestion CV:  No chest pain, orthopnea, PND, swelling in lower extremities, anasarca, dizziness, palpitations, syncope Resp: +shortness of breath with exertion; productive cough; occasional wheeze; chest tightness/chest congestion. No hemoptysis.  No chest wall deformity GI:  No heartburn, indigestion, abdominal pain, nausea, vomiting, diarrhea, change in bowel habits, loss of appetite, bloody stools.  GU: No dysuria, change in color of urine, urgency or frequency.   Skin: No rash, lesions, ulcerations MSK:  No joint pain or swelling.   Neuro: No dizziness or lightheadedness.  Psych: No depression or anxiety. Mood stable.     Physical Exam:  BP 128/76   Pulse 67   Ht '6\' 3"'$  (1.905 m)   Wt 274 lb 8 oz (124.5 kg)   SpO2 94%   BMI 34.31 kg/m   GEN: Pleasant, interactive, well-appearing; obese; in no acute distress. HEENT:  Normocephalic and atraumatic. PERRLA.Sclera white. Nasal turbinates pink, moist and patent bilaterally.  No rhinorrhea present. Oropharynx pink and moist, without exudate or edema. No lesions, ulcerations.  NECK:  Supple w/ fair ROM. No JVD present. Normal carotid impulses w/o bruits. Thyroid symmetrical with no goiter or nodules palpated. No lymphadenopathy.   CV: RRR, no m/r/g, no peripheral edema. Pulses intact, +2 bilaterally. No cyanosis, pallor or clubbing. PULMONARY:   Unlabored, regular breathing. Scattered expiratory wheezes bilaterally A&P. No accessory muscle use. No dullness to percussion. GI: BS present and normoactive. Soft, non-tender to palpation. No organomegaly or masses detected.  MSK: No erythema, warmth or tenderness. Cap refil <2 sec all extrem. No deformities or joint swelling noted.  Neuro: A/Ox3. No focal deficits noted.   Skin: Warm, no lesions or rashe Psych: Normal affect and behavior. Judgement and thought content appropriate.     Lab Results:  CBC    Component Value Date/Time   WBC 8.8 04/05/2022 1220   RBC 4.52 04/05/2022 1220   HGB 14.4 04/05/2022 1220   HGB 14.1 12/31/2019 1004   HCT 41.6 04/05/2022 1220   HCT 41.9 12/31/2019 1004   PLT 229.0 04/05/2022 1220   PLT 204 12/31/2019 1004   MCV 92.0 04/05/2022 1220   MCV 91 12/31/2019 1004   MCH 31.4 03/23/2020 1140   MCHC 34.6 04/05/2022 1220   RDW 13.7 04/05/2022 1220   RDW 14.6 12/31/2019 1004   LYMPHSABS 2.5 04/05/2022 1220   LYMPHSABS 2.3 12/31/2019 1004   MONOABS 0.7 04/05/2022 1220   EOSABS 0.8 (H) 04/05/2022 1220   EOSABS 0.4 12/31/2019 1004   BASOSABS 0.1 04/05/2022 1220   BASOSABS 0.0 12/31/2019 1004    BMET    Component Value Date/Time   NA 140 05/19/2021 1130   NA 140 12/31/2019 1004   K 3.7 05/19/2021 1130   CL 101 05/19/2021 1130   CO2 32 05/19/2021 1130   GLUCOSE 127 (H) 05/19/2021 1130   BUN 18 05/19/2021 1130   BUN 21 12/31/2019 1004   CREATININE 0.90 05/19/2021 1130   CALCIUM 9.4 05/19/2021 1130   GFRNONAA >60 03/23/2020 1140   GFRAA 102 12/31/2019 1004    BNP No results found for: "BNP"   Imaging:  DG Chest 2 View  Result Date: 05/31/2022 CLINICAL DATA:  pna f/u EXAM: CHEST - 2 VIEW COMPARISON:  Radiograph 04/05/2022 FINDINGS: Unchanged cardiomediastinal silhouette. Cardiac loop recorder overlies the left medial lower chest. Unchanged streaky left basilar opacities. No pleural effusion or evidence of  pneumothorax. No new airspace  consolidation. Mild perihilar bronchial wall thickening. Prior right shoulder arthroplasty. Thoracic spondylosis. No acute osseous abnormality. IMPRESSION: Unchanged streaky left basilar opacities, favoring subsegmental atelectasis or overlapping bronchovascular structures. No new airspace consolidation. Mild perihilar bronchial wall thickening suggesting bronchitis. Electronically Signed   By: Maurine Simmering M.D.   On: 05/31/2022 11:46   CUP PACEART REMOTE DEVICE CHECK  Result Date: 05/07/2022 ILR summary report received. Battery status OK. Normal device function. No new symptom, tachy, brady, or pause episodes. No new AF episodes. AF burden is 0% of the time.  Monthly summary reports and ROV/PRN Kathy Breach, RN, CCDS, [employer] ILR summary report received. Battery status OK. Normal device function. No new symptom, tachy, brady, or pause episodes. No new AF episodes. AF burden is 0% of the time.  Monthly summary reports and ROV/PRN Kathy Breach, RN, CCDS, [employer]        Latest Ref Rng & Units 02/18/2021   11:18 AM 03/15/2020   11:59 AM 01/21/2019    1:47 PM 10/08/2017   11:44 AM  PFT Results  FVC-Pre L 3.43  3.44  3.73  4.03   FVC-Predicted Pre % 69  69  74  79   FVC-Post L 3.46  3.37  3.69  3.68   FVC-Predicted Post % 70  67  73  72   Pre FEV1/FVC % % 69  73  64  71   Post FEV1/FCV % % 69  73  70  72   FEV1-Pre L 2.37  2.51  2.40  2.85   FEV1-Predicted Pre % 66  69  65  77   FEV1-Post L 2.40  2.45  2.58  2.63   DLCO uncorrected ml/min/mmHg 24.33  28.29  25.55  28.61   DLCO UNC% % 85  98  88  73   DLCO corrected ml/min/mmHg 24.33  28.29     DLCO COR %Predicted % 85  98     DLVA Predicted % 98  108  108  83   TLC L 7.35  7.20  7.91  6.82   TLC % Predicted % 91  89  98  84   RV % Predicted % 117  104  139  91     Lab Results  Component Value Date   NITRICOXIDE 22 08/24/2017        Assessment & Plan:   COPD with asthma AECOPD/asthma with significant  peripheral eosinophilia and allergic phenotype. This is his second exacerbation this year despite adherence to maintenance regimen. He was treated at previous visit for pna with left lower lung opacities. I question if this could be an eosinophilic pna? We will recheck CXR today. Treat acute symptoms with prednisone taper and empiric doxycyline course. Change rescue to ICS/SABA with Airsupra if covered by insurance. Will discuss biologic therapy with Dr. [name]. Reviewed benefit of this with pt today. Action plan in place.   Patient Instructions  -We will try changing your rescue inhaler to one with an inhaled steroid in it called Airsupra - 1-2 puffs every 4 hours as needed for shortness of breath, wheezing or chest tightness. This will take the place of your current albuterol inhaler  -Continue Albuterol 3 mL neb every 6 hours as needed for shortness of breath or wheezing. Notify if symptoms persist despite rescue inhaler/neb use. -Continue Breztri 2 puffs Twice daily. Brush tongue and rinse mouth well afterwards. Use with spacer.  -Continue flonase 2  spray each nostril daily -Continue saline nasal spray 1 spray each nostril 1-2 times a day  -Continue on supplemental oxygen 2 lpm at night -Continue Mucinex 600 mg Twice daily as needed for chest congestion -Continue Astelin nasal spray 2 sprays each nostril Twice daily as needed for sinus congestion or nasal drainage  -Continue Xyzal (levocetirizine) 10 mg daily for allergies in the morning -Continue Singulair (montelukast) 10 mg At bedtime   Prednisone taper. 4 tabs for 2 days, then 3 tabs for 2 days, 2 tabs for 2 days, then 1 tab for 2 days, then stop. Take in AM with food Doxycycline 1 tab Twice daily for 7 days. Take with food and full glass of water. Wear sunscreen when outside as this increases your risk for sunburns   I'll call you about your x ray results   We may consider starting you on an injectable medicine to help manage your  COPD/asthma at your next visit    Follow up in 2 weeks with Dr. [name] or Alanson Aly. If symptoms do not improve or worsen, please contact office for sooner follow up or seek emergency care   CAP (community acquired pneumonia) Treated with augmentin course. He was clinically improved after abx/steroid course in January 2024. Now with recurrent symptoms. Repeat CXR today. See above.   Allergic rhinitis Well-controlled symptoms. He will continue nasal sprays, xyzal and singulair    I spent 42 minutes of dedicated to the care of this patient on the date of this encounter to include pre-visit review of records, face-to-face time with the patient discussing conditions above, post visit ordering of testing, clinical documentation with the electronic health record, making appropriate referrals as documented, and communicating necessary findings to members of the patients care team.  Clayton Bibles, NP 05/31/2022  Pt aware and understands NP's role.

## 2022-05-31 NOTE — Assessment & Plan Note (Addendum)
Treated with augmentin course. He was clinically improved after abx/steroid course in January 2024. Now with recurrent symptoms. Repeat CXR today. See above.

## 2022-05-31 NOTE — Assessment & Plan Note (Addendum)
AECOPD/asthma with significant peripheral eosinophilia and allergic phenotype. This is his second exacerbation this year despite adherence to maintenance regimen. He was treated at previous visit for pna with left lower lung opacities. I question if this could be an eosinophilic pna? We will recheck CXR today. Treat acute symptoms with prednisone taper and empiric doxycyline course. Change rescue to ICS/SABA with Airsupra if covered by insurance. Will discuss biologic therapy with Dr. [name]. Reviewed benefit of this with pt today. Action plan in place.   Patient Instructions  -We will try changing your rescue inhaler to one with an inhaled steroid in it called Airsupra - 1-2 puffs every 4 hours as needed for shortness of breath, wheezing or chest tightness. This will take the place of your current albuterol inhaler  -Continue Albuterol 3 mL neb every 6 hours as needed for shortness of breath or wheezing. Notify if symptoms persist despite rescue inhaler/neb use. -Continue Breztri 2 puffs Twice daily. Brush tongue and rinse mouth well afterwards. Use with spacer.  -Continue flonase 2 spray each nostril daily -Continue saline nasal spray 1 spray each nostril 1-2 times a day  -Continue on supplemental oxygen 2 lpm at night -Continue Mucinex 600 mg Twice daily as needed for chest congestion -Continue Astelin nasal spray 2 sprays each nostril Twice daily as needed for sinus congestion or nasal drainage  -Continue Xyzal (levocetirizine) 10 mg daily for allergies in the morning -Continue Singulair (montelukast) 10 mg At bedtime   Prednisone taper. 4 tabs for 2 days, then 3 tabs for 2 days, 2 tabs for 2 days, then 1 tab for 2 days, then stop. Take in AM with food Doxycycline 1 tab Twice daily for 7 days. Take with food and full glass of water. Wear sunscreen when outside as this increases your risk for sunburns   I'll call you about your x ray results   We may consider starting you on an injectable  medicine to help manage your COPD/asthma at your next visit    Follow up in 2 weeks with Dr. [name] or Alanson Aly. If symptoms do not improve or worsen, please contact office for sooner follow up or seek emergency care

## 2022-06-02 ENCOUNTER — Other Ambulatory Visit: Payer: Self-pay | Admitting: Nurse Practitioner

## 2022-06-02 DIAGNOSIS — J302 Other seasonal allergic rhinitis: Secondary | ICD-10-CM

## 2022-06-05 ENCOUNTER — Telehealth: Payer: Self-pay | Admitting: Nurse Practitioner

## 2022-06-05 NOTE — Telephone Encounter (Signed)
PT has a coupon we gave him but it has to have some approval by insurance.  [phone]  [facility] on [address] is holding it for him.

## 2022-06-05 NOTE — Telephone Encounter (Signed)
Pt states Ms. Cobb gave him a script that was not appvd by his insurance. Please call w/alternative inhaler.

## 2022-06-06 DIAGNOSIS — I4892 Unspecified atrial flutter: Secondary | ICD-10-CM | POA: Diagnosis not present

## 2022-06-06 DIAGNOSIS — J449 Chronic obstructive pulmonary disease, unspecified: Secondary | ICD-10-CM | POA: Diagnosis not present

## 2022-06-06 DIAGNOSIS — I700 Atherosclerosis of aorta: Secondary | ICD-10-CM | POA: Diagnosis not present

## 2022-06-06 DIAGNOSIS — Z23 Encounter for immunization: Secondary | ICD-10-CM | POA: Diagnosis not present

## 2022-06-06 DIAGNOSIS — D692 Other nonthrombocytopenic purpura: Secondary | ICD-10-CM | POA: Diagnosis not present

## 2022-06-06 DIAGNOSIS — I2721 Secondary pulmonary arterial hypertension: Secondary | ICD-10-CM | POA: Diagnosis not present

## 2022-06-06 DIAGNOSIS — Z1331 Encounter for screening for depression: Secondary | ICD-10-CM | POA: Diagnosis not present

## 2022-06-06 DIAGNOSIS — R82998 Other abnormal findings in urine: Secondary | ICD-10-CM | POA: Diagnosis not present

## 2022-06-06 DIAGNOSIS — Z1389 Encounter for screening for other disorder: Secondary | ICD-10-CM | POA: Diagnosis not present

## 2022-06-06 DIAGNOSIS — Z0000 Encounter for general adult medical examination without abnormal findings: Secondary | ICD-10-CM | POA: Diagnosis not present

## 2022-06-06 DIAGNOSIS — I712 Thoracic aortic aneurysm, without rupture, unspecified: Secondary | ICD-10-CM | POA: Diagnosis not present

## 2022-06-06 DIAGNOSIS — J9611 Chronic respiratory failure with hypoxia: Secondary | ICD-10-CM | POA: Diagnosis not present

## 2022-06-06 DIAGNOSIS — I10 Essential (primary) hypertension: Secondary | ICD-10-CM | POA: Diagnosis not present

## 2022-06-06 DIAGNOSIS — I4510 Unspecified right bundle-branch block: Secondary | ICD-10-CM | POA: Diagnosis not present

## 2022-06-06 DIAGNOSIS — J479 Bronchiectasis, uncomplicated: Secondary | ICD-10-CM | POA: Diagnosis not present

## 2022-06-06 DIAGNOSIS — J984 Other disorders of lung: Secondary | ICD-10-CM | POA: Diagnosis not present

## 2022-06-06 NOTE — Telephone Encounter (Signed)
Called and left voicemail for patient to call back in regards to coupon.

## 2022-06-06 NOTE — Telephone Encounter (Signed)
Pt called the office stating that he has been having a lot of problems trying to get the Airsupra inhaler. States that he did receive a savings card but the pharmacy cannot accept the savings card until the insurance is able to participate in some way but he said that this is not being approved by insurance.  Due to insurance not wanting to approve it, the cost is over $400 which he cannot afford.  Routing to both [place] and prior [place] team for assistance with this.

## 2022-06-07 NOTE — Telephone Encounter (Signed)
Please have pharmacy team run test claim for ICS only* inhalers. Thanks.

## 2022-06-07 NOTE — Telephone Encounter (Signed)
Can we run a ticket on patients insurance for  ICS only* inhalers.   Please route this message back to triage

## 2022-06-08 ENCOUNTER — Other Ambulatory Visit (HOSPITAL_COMMUNITY): Payer: Self-pay

## 2022-06-08 MED ORDER — ARNUITY ELLIPTA 100 MCG/ACT IN AEPB: 1.0000 | INHALATION_SPRAY | Freq: Every day | RESPIRATORY_TRACT | 3 refills | Status: DC

## 2022-06-08 NOTE — Telephone Encounter (Signed)
Advised patient insurance prefers Arnuity inhaler and his copay would only be about $40. Patient said he could afford that. I verified pharmacy and sent in prescription. NFN

## 2022-06-08 NOTE — Telephone Encounter (Signed)
Per test claims on ICS only inhalers- shows Arnuity is the lowest cost option for $47.00/1 month

## 2022-06-08 NOTE — Telephone Encounter (Signed)
Dr. [name] please advise. Patients insurance will not cover Airsupra. Per our prior auth team Arnuity is covered and the lowest cost.

## 2022-06-08 NOTE — Telephone Encounter (Deleted)
1 inhalation of Wixela Inhub 250/50 twice daily, approximately 12 hours apart.

## 2022-06-08 NOTE — Telephone Encounter (Signed)
Arnuity - fluticasone furoate 100 mcg once daily

## 2022-06-10 LAB — CUP PACEART REMOTE DEVICE CHECK
Date Time Interrogation Session: 20240318231128
Implantable Pulse Generator Implant Date: 20211122

## 2022-06-12 ENCOUNTER — Ambulatory Visit: Payer: PPO

## 2022-06-12 DIAGNOSIS — I483 Typical atrial flutter: Secondary | ICD-10-CM | POA: Diagnosis not present

## 2022-06-13 DIAGNOSIS — M62838 Other muscle spasm: Secondary | ICD-10-CM | POA: Diagnosis not present

## 2022-06-13 DIAGNOSIS — M545 Low back pain, unspecified: Secondary | ICD-10-CM | POA: Diagnosis not present

## 2022-06-13 DIAGNOSIS — M542 Cervicalgia: Secondary | ICD-10-CM | POA: Diagnosis not present

## 2022-06-13 DIAGNOSIS — M9901 Segmental and somatic dysfunction of cervical region: Secondary | ICD-10-CM | POA: Diagnosis not present

## 2022-06-13 DIAGNOSIS — M546 Pain in thoracic spine: Secondary | ICD-10-CM | POA: Diagnosis not present

## 2022-06-13 DIAGNOSIS — M62830 Muscle spasm of back: Secondary | ICD-10-CM | POA: Diagnosis not present

## 2022-06-13 DIAGNOSIS — M9902 Segmental and somatic dysfunction of thoracic region: Secondary | ICD-10-CM | POA: Diagnosis not present

## 2022-06-13 DIAGNOSIS — M9903 Segmental and somatic dysfunction of lumbar region: Secondary | ICD-10-CM | POA: Diagnosis not present

## 2022-06-15 ENCOUNTER — Ambulatory Visit: Payer: PPO | Admitting: Internal Medicine

## 2022-06-15 ENCOUNTER — Encounter: Payer: Self-pay | Admitting: Internal Medicine

## 2022-06-15 VITALS — BP 120/70 | HR 72 | Ht 75.0 in | Wt 283.6 lb

## 2022-06-15 DIAGNOSIS — D721 Eosinophilia, unspecified: Secondary | ICD-10-CM | POA: Diagnosis not present

## 2022-06-15 DIAGNOSIS — J449 Chronic obstructive pulmonary disease, unspecified: Secondary | ICD-10-CM

## 2022-06-15 DIAGNOSIS — J479 Bronchiectasis, uncomplicated: Secondary | ICD-10-CM

## 2022-06-15 DIAGNOSIS — Z836 Family history of other diseases of the respiratory system: Secondary | ICD-10-CM | POA: Diagnosis not present

## 2022-06-15 DIAGNOSIS — R490 Dysphonia: Secondary | ICD-10-CM | POA: Diagnosis not present

## 2022-06-15 DIAGNOSIS — J302 Other seasonal allergic rhinitis: Secondary | ICD-10-CM | POA: Diagnosis not present

## 2022-06-15 LAB — CBC WITH DIFFERENTIAL/PLATELET
Basophils Absolute: 0.1 10*3/uL (ref 0.0–0.1)
Basophils Relative: 1 % (ref 0.0–3.0)
Eosinophils Absolute: 0.5 10*3/uL (ref 0.0–0.7)
Eosinophils Relative: 5.4 % — ABNORMAL HIGH (ref 0.0–5.0)
HCT: 40.7 % (ref 39.0–52.0)
Hemoglobin: 14 g/dL (ref 13.0–17.0)
Lymphocytes Relative: 23.5 % (ref 12.0–46.0)
Lymphs Abs: 2.2 10*3/uL (ref 0.7–4.0)
MCHC: 34.3 g/dL (ref 30.0–36.0)
MCV: 93.3 fl (ref 78.0–100.0)
Monocytes Absolute: 0.9 10*3/uL (ref 0.1–1.0)
Monocytes Relative: 9.6 % (ref 3.0–12.0)
Neutro Abs: 5.6 10*3/uL (ref 1.4–7.7)
Neutrophils Relative %: 60.5 % (ref 43.0–77.0)
Platelets: 221 10*3/uL (ref 150.0–400.0)
RBC: 4.37 Mil/uL (ref 4.22–5.81)
RDW: 14.6 % (ref 11.5–15.5)
WBC: 9.2 10*3/uL (ref 4.0–10.5)

## 2022-06-15 MED ORDER — SODIUM CHLORIDE 3 % IN NEBU: INHALATION_SOLUTION | RESPIRATORY_TRACT | 12 refills | Status: DC | PRN

## 2022-06-15 NOTE — Progress Notes (Signed)
IOV 08/24/2017  Chief Complaint  Patient presents with   Consult    Referred by Dr. Lew Hedge due to trouble coughing, coughing up mucus that is dark gray in color, and breathing has become worse. Pt also has c/o hoarseness that began 6 months ago. Pt's main complaint is labored breathing. Pt states he had a brother who died of pulmonary fibrosis.    Noah Huffman , 82 y.o. , with dob 1940/12/26 and male ,Not Hispanic or Latino from [address] [place] [address] - presents to pulm clinic for dyspnea and cough.  He is originally from [place] area and moved to [place] in the late 1960s and in [place] since the 1970s.His brother died at age 65 from pulmonary fibrosis lasted at the [facility] he is also worried about that.  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  He smoked heavily over 62 pack smoking history but quit over 25 years ago.  He tells me for the last 4 to 5 years he has had shortness of breath that is slowly progre  ssive.  Definitely present with exertion relieved by rest.  Walking today in our office 185 feet x 3 laps on room air he did have some mild shortness of breath but did not desaturate.  There is no associated chest pain.    He does have a chronic cough that for which she was started on Symbicort few to several years ago and it did seem to help but in the last year or 2 the cough is deteriorated and associated with some yellow sputum production.  He does have some on and off hoarseness of voice that is constant for the last few years.  He recollects having had a ENT evaluation a few years ago that was normal.  He clears his throat.  There is excess feeling of postnasal drip there is some occasional choking episodes he finds the cough annoying and there is an occasional sensation of something sticking in his throat but there is no heartburn or dysphagia or coughing after lying down.  His RSI cough  score is 19.  He is noticed to be on losartan  In 2017 he did have a plain old exercise treadmill test that was normal.   FenO 08/24/2017 = 22 ppb on spiriva and symbicort.  Recently started on Symbicort in the last few months by Dr. Fredderick Phenix allergist.  Patient tells me that allergy testing was negative/normal    In 2014 he had spirometry that shows both reduced FVC and FEV1 with a reduced ratio slightly favoring obstruction  He had a CT chest aug 2018 that shows some bibasilar atelectasis this was a CT chest angiography.  In 2017 he had a CT abdomen pelvis that does not show these applications.  I personally visualized the CT chest and confirmed the findings.    OV 10/08/2017  Chief Complaint  Patient presents with   Follow-up    PFT done today, clears throat,    Follow-up cough and shortness of breath. He continues to be is on Spiriva and Symbicort. In the interim his symptoms of improvement. He only function test today and his FEV1 is 71% associated with moderate obstruction. DLCO is mildly reduced. Overall he feels great. We discussed pulmonary rehabilitation but he says he will exercise on his own and will think about attending rehabilitation center if x-ray does not help. He had high resolution CT  chest that I personally visualized and it shows bilateral lower lobe bronchiectasis Currently no sputum production.  OV 01/01/2019  Subjective:  Patient ID: Noah Huffman, male , DOB: 1941/03/04 , age 69 y.o. , MRN: [id number] , ADDRESS: [place] [id number]   01/01/2019 -   Chief Complaint  Patient presents with   Bronchiectasis without complication    Patient said there are times when the breathing is worse, especially when hot. Recently had chest CT and was told to some to clinic to discuss.   Follow-up bilateral lower lobe bronchiectasis on Spiriva and Symbicort but with a family history of pulmonary fibrosis.  [His brother who is 5 years younger than him died  in 07-16-2015 at the age of 40 from pulmonary fibrosis]  HPI [name] 82 y.o. -presents for follow-up.  Last seen in July 2019.  Since then he has been on Spiriva and Symbicort.  He says the shortness of breath is stable but his chronic cough with sputum production has gotten worse a little bit especially early in the morning.  He is worried he might have pulmonary fibrosis because his brother died from it.  He also had a recent CT chest of the aorta that reported the bronchiectasis.  Therefore his allergist Dr. Fredderick Phenix is asked him to be reevaluated with [place].  Otherwise no new problems.  The only other health issue in the last 1 year is his left hip pain.        OV 01/28/2019  Subjective:  Patient ID: Noah Huffman, male , DOB: 1940-04-21 , age 41 y.o. , MRN: [id number] , ADDRESS: [place] [address]   01/28/2019 -   Chief Complaint  Patient presents with   Follow-up    PFT and CT chest performed since last visit and pt is here today to go over the results. Pt states his cough is still the same since last visit.   Follow-up bilateral lower lobe bronchiectasis on Spiriva and Symbicort but with a family history of pulmonary fibrosis.  [His brother who is 5 years younger than him died in July 16, 2015 at the age of 36 from pulmonary fibrosis]  HPI [name] 82 y.o. -returns for follow-up of her pulmonary function testing and high-resolution CT chest.  The high-resolution CT chest is again described as bronchiectasis in the bilateral lower lobes.  To me it looks a little bit like reticulation on my personal visualization and I wonder if there is early fibrosis.  Of note, somebody in radiology told him that he had pulmonary fibrosis and he got upset.  He is very scared about getting pulmonary fibrosis because his brother died from the same.  Regardless the level of radiologic abnormality appears to be very mild.  His pulmonary function test itself is stable compared to a year  ago.  However his symptoms appear to be out of proportion to the pulmonary findings on the CT scan.  He tells me that his cough is severe RSI cough score is 27.  He clears his throat a lot.  He feels like there is lot of phlegm in the throat.  When I asked him if there is a sensation there is a "frog in the throat" he did replied in the affirmative.  He clears her throat a lot.  He did that even during the office visit.  But the cough is stable compared to a year ago  His shortness of breath he states is worse.  Class III on exertion relieved by rest.  He has significant visceral obesity.  He says his been tested for sleep apnea 3 times in the past but this has been negative.  His last cardiac stress test was 06-21-15 a plain old treadmill test that I reviewed the result and was normal.  It has been several years since he had an echocardiogram.  He is willing to have another echocardiogram and if this is normal undergo pulmonary stress test.  He is reporting new onset of hoarseness of voice for the last 1 year.  It seems to be intermittent.  He states his wife is also getting it.  He has previous history of smoking.  Has been on Symbicort for 7 years and Spiriva recently.  He has not seen ENT in several years or never seen them at all.    CT chest high resolution  IMPRESSION: 1. No evidence of interstitial lung disease. 2. Mild bibasilar bronchiectasis and scarring, as before. 3. Aortic atherosclerosis (ICD10-170.0). Coronary artery calcification.     Electronically Signed   By: Lorin Picket M.D.   On: 01/24/2019 16:29   OV 04/09/2019  Subjective:  Patient ID: Noah Huffman, male , DOB: Nov 12, 1940 , age 79 y.o. , MRN: [id number] , ADDRESS: [address] [place] [address]  Follow-up bilateral lower lobe bronchiectasis on Spiriva and Symbicort but with a family history of pulmonary fibrosis.  [His brother who is 5 years younger than him died in 06/21/15 at the age of 19 from pulmonary  fibrosis]   04/09/2019 -   Chief Complaint  Patient presents with   Follow-up    f/u bronchiectasis, minimal cough with phlegm with cloudy mucus     HPI YOSEPH BUCHANAN 82 y.o. -presents for his follow-up of multiple issues.  He is on Spiriva and Symbicort but despite this he has significant amount of cough he is clearing his throat all the time including even at this visit.  He says he has associated dry mouth.  He is already on pre-existing gabapentin.  Med review shows losartan.  He saw Dr. Lorelee Cover and ENT after my referral to him at the last visit.  Apparently has dry mouth and the trying some humidification.  He is some better with his cough.  His chronic shortness of breath continues.  He had an echocardiogram that shows diastolic dysfunction.  He now tells me that because of the pandemic he stopped exercising at the [facility] and he regained 20 pounds and this then contributed the shortness of breath.  He initially agreed for pulmonary stress test but after discussing this with him we think the most likely etiology is obesity, diastolic dysfunction physical deconditioning therefore going to hold off.  I discussed his bilateral lower lobe bronchiectasis at the case conference.  He is very concerned about pulmonary fibrosis because his brother passed away from it.  He also has a previous 60 pack smoking and therefore he is a high risk candidate for pulmonary fibrosis especially being male, Caucasian and age greater than 67.  However the case conference it was resolved that he only had bronchiectasis and no evidence of fibrosis.  Review of the labs indicate that he still needs to have bronchiectasis work-up    OV 01/14/2020   Subjective:  Patient ID: Noah Huffman, male , DOB: Apr 01, 1940, age 9 y.o. years. , MRN: [id number],  ADDRESS: [place] [id number] PCP  [name], MD Providers : Treatment Team:  Attending Provider:  Brand Males, MD Patient Care Team: [name], MD as PCP - General (Internal Medicine) Vickie Epley, MD as PCP - Electrophysiology (Cardiology)    Chief Complaint  Patient presents with   Follow-up    worsening chest discomfort over last 2 months, incrfeased pain with sneezing     Follow-up bilateral lower lobe bronchiectasis on Spiriva and Symbicort but with a family history of pulmonary fibrosis.  [His brother who is 5 years younger than him died in June 29, 2015 at the age of 66 from pulmonary fibrosiAnd a first cousin in 2021s ]    HPI DEMETRUIS DEDO 41 y.o. -presents for follow-up.  Last seen in early part of the year.  Since then overall shortness of breath and wheezing and cough are stable.  He says he has had atrial flutter and then dealing with urinary tract infections.  He has appointment with his urologist.  He does not believe he is ever taken [place].  He is now on Eliquis.  His right shoulder is bothering him and he plans to have surgery.  In terms of shortness of breath cough and wheezing these are stable.  But he tells me that he has since spring 2021 this intermittent pain in the right supramammary/infraclavicular area.  This is dull and constant.  He thinks it might be related to the shoulder but he is worried it might be related to the lungs.  He tells me that this year another first cousin died from pulmonary fibrosis he is really worried that he might be getting pulmonary fibrosis.   Earlier in the year his autoimmune antibodies were negative but blood IgE was slightly high he still continue Spiriva and Symbicort which he thinks is helping him.  He wants refills for this.  01/24/2021: OV with Dr. Annamaria Boots for f/u after sleep study. Discussed results. Concerned about nasal mask d/t not much airflow from previous nasal fx. Also concerned about claustrophobia with full face mask. Tx recently by PCP for AECOPD with prednisone and abx. Continued Breztri and PRN albuterol. Pt decided to wait to move forward with CPAP therapy  or oral appliance until he discussed effects of untreated mild OSA on his a fib with his cardiologist. Continued on nocturnal 2 lpm supplemental O2.  05/19/2021: OV with Cobb NP.  Worsening shortness of breath and ongoing productive cough over the last 2 to 3 months.  Felt as though activity intolerance had declined.  Had been treated a few times for AECOPD by his PCP with prednisone and antibiotics over the last 6 months.  Also has some increasing allergy type symptoms including runny nose, itchy eyes and scratchy throat.  Started take over-the-counter allergy medicine from [facility] and uses Flonase and saline nasal spray.  Previously had met with Dr. Annamaria Boots to discuss mild OSA and treatment options.  Still unsure at this visit what he would like to do next.  Continued on 2 L/min supplemental O2 at night.  Continued on triple therapy regimen with Breztri.  CBC with differential and IgE obtained which showed significant eosinophilia and elevated IgE.  Started on Singulair and Xyzal.  Provided with short prednisone burst.  Plans to obtain FeNO at follow-up.  CXR without any evidence of superimposed infection  06/02/2021:   82 year old male, former smoker (62.5-pack-year history) followed for COPD Gold 3, bronchiectasis, chronic cough and OSA.  He is a patient of Dr. Golden Pop and also followed by Dr. Annamaria Boots for sleep.  Last seen in office on 05/19/2021 by Cobb NP.  Past medical history significant for history of asthma, hypertension, A-fib, GERD, OA, BPH, obesity, HLD.  Patient presents today for follow-up.  Feels as though his breathing is slightly improved although he does still continue to experience shortness of breath from exertion.  Feels as though he still has activity limitations.  Does feel like his cough has improved and is not as frequent as it used to be.  Production has mostly resolved.  Does continue to have persistent allergy symptoms with itchy eyes and sneezing.  Started on Xyzal and Singulair at  his last visit.  Denies lower extremity edema, orthopnea or PND, hemoptysis.  Has seen an allergist in the past.  Continued on Breztri twice daily.  Rarely uses albuterol.  FeNO 23 ppb    TEST/EVENTS:  04/27/2013 and PSG: AHI 16.5/h, desaturation 87% 01/30/2020 HRCT chest: Atherosclerosis, CAD.  Mild diffuse bilateral bronchial wall thickening.  Mild band appearing scarring and volume loss of bases, left greater than right; similar in appearance when compared to previous. 09/03/2019 echo: On 2 L/min, showed sustained hypoxemia and bradycardia 12/24/2020 NPSG: AHI 9/h, desaturation 85% 02/18/2021 PFTs: FVC 3.46 (70), FEV1 2.4 (67, ratio 69, TLC 91%, DLCO uncorrected 85%.  Moderate obstructive airway disease with normal diffusion capacity.  No BD  OV 06/21/2021  Subjective:  Patient ID: Noah Huffman, male , DOB: [birth year] , age 70 y.o. , MRN: [id number] , ADDRESS: [address] [place] [id number] PCP [name], MD Patient Care Team: [name], MD as PCP - General (Internal Medicine) Vickie Epley, MD as PCP - Electrophysiology (Cardiology)  This Provider for this visit: Treatment Team:  Attending Provider: Brand Males, MD    06/21/2021 -   Chief Complaint  Patient presents with   Follow-up    Follow up for COPD. Pt just finished prednisone. Pt is on Breztri and albuterol. Pt states his breathing si doing better.      HPI ALPER NOEL 82 y.o. - regular OV.  SAw APP and says she gave lot of medicines for sinuses and then better with less congestion . She referred him to allergies at [facility]. Not yet seeen. He has seen Dr Josip Hedge in past and prefers to go there, [place]  on astelin, teassoli, flonase, mucinex, xyxal, singulair, breztri and neurontin. Last PFT dec 2022 and slightly down over time . LAST HRCT Nov 2021     04/05/2022: OV with Cobb NP for acute visit. He was doing better after I saw him last. He went to pulmonary rehab as well, which he's not  sure made a huge difference but he graduated in October. He did not continue to do the exercises afterwards. He's not sure if this is the reason why but he has been more short of breath over the last 2-3 months. Feels like he hasn't been able to complete activities around the house without getting out of breath. Then over the past two weeks, he started having more wheezing, chest congestion, and a productive cough with cream colored sputum, mostly in AM. He also has postnasal drainage and feels like he wakes up choked in the morning from the mucus. He denies fevers, chills, hemoptysis, orthopnea, PND, leg swelling. He is on [employer]. He uses his flonase and astelin daily. Takes mucinex twice a day. He is not still on xyzal or singulair. Not sure when he stopped these.  HRCT chest from August 2023 did not show any ILD/IPF. He did have some mucous plugging and mild air trapping.  FeNO 25 ppb. CXR with concern for opacity at left lung base, consistent with pna given infectious symptoms. Eosinophils remain significantly elevated on CBC at 800. Treated with augmentin, prednisone taper. Instructed to restart xyzal and singulair.   04/19/2022: Ov with Cobb NP for follow up after being treated for pneumonia and AECOPD/asthma. He has completed augmentin and prednisone courses. He is feeling much better. His breathing feelings back to his baseline. His cough is also improved. Still occasionally productive with white/cream sputum. It has decreased and is not as thick. Chest congestion and wheezing have resolved. His nasal symptoms are also better. He still feels like he has some pressure in his ears; no pain or drainage. Going to see his audiologist tomorrow. He did restart xyzal and singulair since he was here last.   05/31/2022: Today - follow up Patient presents today for follow up but also having some acute symptoms. He thinks it may be due to allergies but over the past weeks, he's been more short of breath and has a  productive cough with brown sputum. Noticing some wheezing. Having more chest congestion/tightness. Denies fevers, chills, URI symptoms, hemoptysis, leg swelling, orthopnea. He has used his Judithann Sauger an extra time for the past few days. Feels like this helps more than his albuterol does. Taking singulair and xyzal.   FeNO 18 ppb   OV 06/15/2022  Subjective:  Patient ID: Noah Huffman, male , DOB: 1940-05-10 , age 56 y.o. , MRN: [id number] , ADDRESS: [address] [place] [id number] PCP [name], MD Patient Care Team: [name], MD as PCP - General (Internal Medicine) Vickie Epley, MD as PCP - Electrophysiology (Cardiology)  This Provider for this visit: Treatment Team:  Attending Provider: Brand Males, MD    Follow-up bilateral lower lobe bronchiectasis on Spiriva and Symbicort but with a family history of pulmonary fibrosis.  [His brother who is 5 years younger than him died in 2015/07/11 at the age of 60 from pulmonary fibrosiAnd a first cousin in 2021s ]  Chronic cough [high eosinophils, allergy visit in 07-11-2018 04/2021 to Dr. Fredderick Phenix reassured] Hoarseness of voice    06/15/2022 -   Chief Complaint  Patient presents with   Follow-up    F/up      HPI DIJUAN FRALEIGH 82 y.o. -returns for follow-up.  I last saw him in April 2023.  After that he only showed up for follow-up with Roxan Diesel nurse practitioner in early 11-Jul-2022.  I am seeing him now after a year.  He says overall he is doing well.  However earlier in the year 2022/07/11 he did have respiratory exacerbations and needed steroids and treatment by Roxan Diesel.  He states he is slowly getting back to baseline but he is troubled by hoarseness of voice.  He says his courses of voice is going on on and off for over a year.  He has not seen ENT for this.  He recollects in the past that sent him to ENT but only his sinuses was inspected.  He is willing to see ENT again.  In addition he tells me that he is having chronic  cough with half cup of sputum that is grayish and a day.  He states he brings up sputum at least every 30 minutes.  It is more in volume since the recent exacerbations but overall is a chronic problem for the last few years.  Review of the medication shows that Joellen Jersey tried him on air supra but it  was too expensive.  He is also telling me that he is taking Arnuity and Breztri at the same time although I do not see documentation clearly as to why.  I told him to stop the Arnuity but still take the breztri.  He continues his other medications including antihistamines.  His blood eosinophils continue to be high on a constant basis [see below].  His nitric oxide recently was normal.  But this is on a steroid inhaler.  He continues to be symptomatic with his cough and sputum production.  He is willing to try 3% saline nebulizer.  He is willing to get his blood eosinophils retested.  Last pulmonary function test was in 2022 Last CT scan of the chest was in August 2023.    Simple office walk 185 feet x  3 laps goal with forehead probe 08/24/2017   O2 used Room air  Number laps completed 3  Comments about pace Normal pace,, tall man,   Resting Pulse Ox/HR 98% and 61/min  Final Pulse Ox/HR 96% and 88/min  Desaturated </= 88% no  Desaturated <= 3% points no  Got Tachycardic >/= 90/min no  Symptoms at end of test Mild dyspneic  Miscellaneous comments x   Results for DONTAY, BURDELL (MRN [id number]) as of 01/28/2019 10:58  Ref. Range 10/08/2017 11:44 01/21/2019 13:47  FVC-Pre Latest Units: L 4.03 3.73  FVC-%Pred-Pre Latest Units: % 79 74  Results for RAKIM, ZHAI (MRN [id number]) as of 01/28/2019 10:58  Ref. Range 10/08/2017 11:44 01/21/2019 13:47  TLC Latest Units: L 6.82 7.91  TLC % pred Latest Units: % 84 98  Results for INGRAM, MEECE (MRN [id number]) as of 01/28/2019 10:58  Ref. Range 10/08/2017 11:44 01/21/2019 13:47  DLCO unc Latest Units: ml/min/mmHg 28.61 25.55  DLCO unc % pred Latest Units:  % 73 88     PFT     Latest Ref Rng & Units 02/18/2021   11:18 AM 03/15/2020   11:59 AM 01/21/2019    1:47 PM 10/08/2017   11:44 AM  PFT Results  FVC-Pre L 3.43  3.44  3.73  4.03   FVC-Predicted Pre % 69  69  74  79   FVC-Post L 3.46  3.37  3.69  3.68   FVC-Predicted Post % 70  67  73  72   Pre FEV1/FVC % % 69  73  64  71   Post FEV1/FCV % % 69  73  70  72   FEV1-Pre L 2.37  2.51  2.40  2.85   FEV1-Predicted Pre % 66  69  65  77   FEV1-Post L 2.40  2.45  2.58  2.63   DLCO uncorrected ml/min/mmHg 24.33  28.29  25.55  28.61   DLCO UNC% % 85  98  88  73   DLCO corrected ml/min/mmHg 24.33  28.29     DLCO COR %Predicted % 85  98     DLVA Predicted % 98  108  108  83   TLC L 7.35  7.20  7.91  6.82   TLC % Predicted % 91  89  98  84   RV % Predicted % 117  104  139  91    HRCT Aug 2023   IMPRESSION: 1. No evidence of fibrotic interstitial lung disease. 2. 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 3. 4.2 cm ascending aortic aneurysm, increased from 3.9 cm on 01/30/2020. Recommend annual imaging followup by CTA or MRA.  This recommendation follows 2010 ACCF/AHA/AATS/ACR/ASA/SCA/SCAI/SIR/STS/SVM Guidelines for the Diagnosis and Management of Patients with Thoracic Aortic Disease. Circulation. 2010; [id number]. Aortic aneurysm NOS (ICD10-I71.9). 4. New 3 mm left upper lobe nodule. No follow-up needed if patient is low-risk.This recommendation follows the consensus statement: Guidelines for Management of Incidental Pulmonary Nodules Detected on CT Images: From the Fleischner Society 2017; Radiology 2017; 284:228-243. 5. Aortic atherosclerosis (ICD10-I70.0). Coronary artery calcification. 6. Enlarged pulmonic trunk, indicative of pulmonary arterial hypertension.     Electronically Signed   By: Lorin Picket M.D.   On: 11/02/2021 10:45    has a past medical history of Allergy, Asthma, Atrial fibrillation ([place]), BPH (benign prostatic hypertrophy) with  urinary obstruction, Cataract, COPD (chronic obstructive pulmonary disease) ([place]), Diverticulosis, Dysrhythmia, GERD (gastroesophageal reflux disease), Hyperglycemia, Hyperlipidemia, Hypertension, Inguinal hernia, Neuromuscular disorder ([place]), Obesity, OSA (obstructive sleep apnea) (06/06/2013), Osteoarthritis, and Sleep apnea.   reports that he quit smoking about 34 years ago. His smoking use included cigarettes. He has a 62.50 pack-year smoking history. He has never used smokeless tobacco.  Past Surgical History:  Procedure Laterality Date   A-FLUTTER ABLATION N/A 01/02/2020   Procedure: A-FLUTTER ABLATION;  Surgeon: Vickie Epley, MD;  Location: [facility];  Service: Cardiovascular;  Laterality: N/A;   APPENDECTOMY     arthroscopic knee surgery     CATARACT EXTRACTION  2012   bilateral   COLONOSCOPY     HAMMER TOE SURGERY     INGUINAL HERNIA REPAIR     right   POLYPECTOMY     REVERSE SHOULDER ARTHROPLASTY Right 03/26/2020   Procedure: REVERSE SHOULDER ARTHROPLASTY;  Surgeon: Netta Cedars, MD;  Location: [facility];  Service: Orthopedics;  Laterality: Right;  interscalane block   TONSILLECTOMY AND ADENOIDECTOMY     TOTAL HIP ARTHROPLASTY  2007   right    Allergies  Allergen Reactions   Macrobid [Nitrofurantoin]    Morphine And Related Rash    Immunization History  Administered Date(s) Administered   Fluad Quad(high Dose 65+) 12/02/2018, 12/24/2019   Influenza Split 04/13/2011, 03/20/2012, 04/18/2012   Influenza, Quadrivalent, Recombinant, Inj, Pf 12/24/2019, 04/07/2021   Influenza,inj,Quad PF,6+ Mos 01/14/2017   Influenza,inj,quad, With Preservative 12/11/2018   PFIZER(Purple Top)SARS-COV-2 Vaccination 05/15/2019, 06/10/2019   Pneumococcal Conjugate-13 02/28/2013, 04/21/2013, 05/06/2015   Pneumococcal Polysaccharide-23 01/23/2007, 05/09/2016, 06/06/2022   Td 03/21/2004, 08/25/2013, 08/25/2013   Td (Adult) 08/25/2013   Unspecified SARS-COV-2 Vaccination 05/21/2019     Family History  Problem Relation Age of Onset   Leukemia Father    Transient ischemic attack Father    Pneumonia Father    Colon cancer Mother 54   Brain cancer Brother    Pulmonary fibrosis Brother    Colon polyps Neg Hx    Esophageal cancer Neg Hx    Rectal cancer Neg Hx    Stomach cancer Neg Hx      Current Outpatient Medications:    aspirin EC 81 MG tablet, Take 1 tablet (81 mg total) by mouth daily. Swallow whole., Disp: 90 tablet, Rfl: 3   azelastine (ASTELIN) 0.1 % nasal spray, USE 2 SPRAYS IN EACH NOSTRIL TWICE DAILY AS DIRECTED, Disp: 30 mL, Rfl: 3   BREZTRI AEROSPHERE 160-9-4.8 MCG/ACT AERO, INHALE 2 PUFFS BY MOUTH IN THE MORNING AND AT BEDTIME, Disp: 10.7 g, Rfl: 5   fluticasone (FLONASE) 50 MCG/ACT nasal spray, Place 1 spray into both nostrils daily., Disp: , Rfl:    gabapentin (NEURONTIN) 600 MG tablet, Take 1,800 mg by mouth 3 (three) times  daily., Disp: , Rfl:    guaiFENesin (MUCINEX) 600 MG 12 hr tablet, Take 1 tablet (600 mg total) by mouth 2 (two) times daily., Disp: 60 tablet, Rfl: 3   levocetirizine (XYZAL) 5 MG tablet, Take 1 tablet (5 mg total) by mouth every evening., Disp: 90 tablet, Rfl: 1   losartan-hydrochlorothiazide (HYZAAR) 100-25 MG tablet, Take 1 tablet by mouth daily., Disp: , Rfl:    montelukast (SINGULAIR) 10 MG tablet, Take 1 tablet (10 mg total) by mouth at bedtime., Disp: 30 tablet, Rfl: 5   Multiple Vitamins-Minerals (PRESERVISION AREDS 2) CAPS, Take 1 capsule by mouth daily. , Disp: , Rfl:    polyvinyl alcohol (LIQUIFILM TEARS) 1.4 % ophthalmic solution, Place 1 drop into both eyes 2 (two) times daily., Disp: , Rfl:    rosuvastatin (CRESTOR) 10 MG tablet, Take 10 mg by mouth at bedtime., Disp: , Rfl:    sodium chloride (OCEAN) 0.65 % SOLN nasal spray, Place 1 spray into both nostrils daily., Disp: , Rfl:    tamsulosin (FLOMAX) 0.4 MG CAPS capsule, Take 0.4 mg by mouth daily., Disp: , Rfl:    Albuterol-Budesonide (AIRSUPRA) 90-80 MCG/ACT  AERO, Inhale 1-2 puffs into the lungs every 4 (four) hours as needed (shortness of breath, wheezing, chest tightness). (Patient not taking: Reported on 06/15/2022), Disp: 10.7 g, Rfl: 5   Fluticasone Furoate (ARNUITY ELLIPTA) 100 MCG/ACT AEPB, Inhale 1 puff into the lungs daily. (Patient not taking: Reported on 06/15/2022), Disp: 30 each, Rfl: 3   predniSONE (DELTASONE) 10 MG tablet, 4 tabs for 2 days, then 3 tabs for 2 days, 2 tabs for 2 days, then 1 tab for 2 days, then stop (Patient not taking: Reported on 06/15/2022), Disp: 20 tablet, Rfl: 0  Current Facility-Administered Medications:    0.9 %  sodium chloride infusion, 500 mL, Intravenous, Once, Irene Shipper, MD   albuterol (PROVENTIL) (2.5 MG/3ML) 0.083% nebulizer solution 2.5 mg, 2.5 mg, Nebulization, Q6H PRN, Cobb, Karie Schwalbe, NP   methylPREDNISolone acetate (DEPO-MEDROL) injection 80 mg, 80 mg, Intramuscular, Once, Clayton Bibles, NP   Latest Reference Range & Units 11/30/19 09:27 05/19/21 11:30 04/05/22 12:20  Eosinophils Absolute 0.0 - 0.7 K/uL 0.1 0.8 (H) 0.8 (H)  (H): Data is abnormally high    Objective:   Vitals:   06/15/22 1348  BP: 120/70  Pulse: 72  SpO2: 95%  Weight: 283 lb 9.6 oz (128.6 kg)  Height: 6\' 3"  (1.905 m)    Estimated body mass index is 35.45 kg/m as calculated from the following:   Height as of this encounter: 6\' 3"  (1.905 m).   Weight as of this encounter: 283 lb 9.6 oz (128.6 kg).  @WEIGHTCHANGE @  [employer]   06/15/22 1348  Weight: 283 lb 9.6 oz (128.6 kg)     Physical Exam    General: No distress. Looks well Neuro: Alert and Oriented x 3. GCS 15. Speech normal Psych: Pleasant Resp:  Barrel Chest - no.  Wheeze - no, Crackles - no, No overt respiratory distress CVS: Normal heart sounds. Murmurs - no Ext: Stigmata of Connective Tissue Disease - no HEENT: Normal upper airway. PEERL +. No post nasal drip        Assessment:       ICD-10-CM   1. 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 Family history of pulmonary fibrosis  Z83.6     5. Seasonal allergic rhinitis, unspecified trigger  J30.2     6. 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  - Brush tongue and rinse mouth well afterwards. Use with spacer.  - no need for ARNUITY - remove from [facility] -Continue flonase 2 spray each nostril daily -Continue saline nasal spray 1 spray each nostril 1-2 times a day  -Continue on supplemental oxygen 2 lpm at night -Continue Mucinex 600 mg Twice daily for chest congestion -Continue Astelin nasal spray 2 sprays each nostril Twice daily  - Continue Xyzal (levocetirizine) 10 mg daily for allergies in the morning -Continue Singulair (montelukast) 10 mg At bedtime   - Check CBC with diff 06/15/2022 -START 17mL of 3% hypertonic saline nebulizer twice daily -  - do spirometry and dlco in 3 months   HOARSENESS OF VOICe  Plan  - refer ENT   Follow up  in [place]  Dr. [name]. But afer spiro/dlco - If symptoms do not improve or worsen, we can consider VEST Rx     SIGNATURE    Dr. Brand Males, M.D., F.C.C.P,  Pulmonary and Critical Care Medicine Staff Physician, [place] Director - Interstitial Lung  Disease  Program  [facility] [place] [facility] [place], [place], [address]  Pager: [phone], If no answer or between  15:00h - 7:00h: call [phone] Telephone: [phone]  2:40 PM 06/15/2022

## 2022-06-15 NOTE — Patient Instructions (Addendum)
Family history of pulmonary fibrosis Bronchiectasis without complication (HCC) Previous 60 pack smoking history Clinical COPD Sinus congestion and history of seasonal allergies    - PFT dec 2022 might be slightly down since 2019 - Last CT -August 2023 and stable - Noted sinus symptoms much better after Noah Huffman started several sinus/anti-histamine allergy meds - Glad Noah Huffman visit in 2022/2023 for allergies was reassuring but you have high eosinophiuls  Plan -Continue Albuterol inhaler 2 puffs or 3 mL neb every 6 hours as needed   - remove airsupra from [facility] -Continue Breztri 2 puffs Twice daily.  - Brush tongue and rinse mouth well afterwards. Use with spacer.  - no need for ARNUITY - remove from [facility] -Continue flonase 2 spray each nostril daily -Continue saline nasal spray 1 spray each nostril 1-2 times a day  -Continue on supplemental oxygen 2 lpm at night -Continue Mucinex 600 mg Twice daily for chest congestion -Continue Astelin nasal spray 2 sprays each nostril Twice daily  - Continue Xyzal (levocetirizine) 10 mg daily for allergies in the morning -Continue Singulair (montelukast) 10 mg At bedtime   - Check CBC with diff 06/15/2022 -START 80mL of 3% hypertonic saline nebulizer twice daily -  - do spirometry and dlco in 3 months   HOARSENESS OF VOICe  Plan  - refer ENT   Follow up  in [place]  Dr. [name]. But afer spiro/dlco - If symptoms do not improve or worsen, we can consider VEST Rx

## 2022-06-21 NOTE — Progress Notes (Signed)
[facility] Summary Report / Loop Recorder 

## 2022-06-23 ENCOUNTER — Telehealth: Payer: Self-pay | Admitting: Internal Medicine

## 2022-06-23 DIAGNOSIS — J479 Bronchiectasis, uncomplicated: Secondary | ICD-10-CM

## 2022-06-23 MED ORDER — SODIUM CHLORIDE 3 % IN NEBU: INHALATION_SOLUTION | Freq: Two times a day (BID) | RESPIRATORY_TRACT | 12 refills | Status: AC

## 2022-06-23 MED ORDER — AEROCHAMBER MV MISC: 0 refills | Status: AC

## 2022-06-23 NOTE — Telephone Encounter (Signed)
Pt called the office stating that he has not heard anything about receiving nebulizer machine nor has he received the solution yet from pharmacy.  Pt said that he was told that [facility] was not able to get the sodium chloride solution and said that we could try to send that to a different pharmacy to see if they are able to get the Rx for him.  In regards to the neb machine, told pt that order for neb machine was sent to [facility] and he said he has not heard anything from them.   Routing to [facility] to check on the nebulizer order that was placed. Please advise.

## 2022-06-26 NOTE — Telephone Encounter (Signed)
I called [facility] and spoke to [facility].  She states they tried to call the pt Friday around 4 and was unable to reach him.  That was after this phone message was placed.  She is going to call him now.  She also states they do not carry the Hypertonic Saline Solution and they told pt this prior to Friday.  Will need to get that from somewhere else.

## 2022-06-28 ENCOUNTER — Telehealth: Payer: Self-pay | Admitting: Internal Medicine

## 2022-06-28 NOTE — Telephone Encounter (Signed)
Blood eos high. 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 Might be of benefit for him to give a trial of fasenra. V nucala If he is interested he can set  a visit with app to review this and consider   Lab Results  Component Value Date   NITRICOXIDE 22 08/24/2017

## 2022-06-29 DIAGNOSIS — M9903 Segmental and somatic dysfunction of lumbar region: Secondary | ICD-10-CM | POA: Diagnosis not present

## 2022-06-29 DIAGNOSIS — M62830 Muscle spasm of back: Secondary | ICD-10-CM | POA: Diagnosis not present

## 2022-06-29 DIAGNOSIS — M62838 Other muscle spasm: Secondary | ICD-10-CM | POA: Diagnosis not present

## 2022-06-29 DIAGNOSIS — M546 Pain in thoracic spine: Secondary | ICD-10-CM | POA: Diagnosis not present

## 2022-06-29 DIAGNOSIS — M545 Low back pain, unspecified: Secondary | ICD-10-CM | POA: Diagnosis not present

## 2022-06-29 DIAGNOSIS — M9901 Segmental and somatic dysfunction of cervical region: Secondary | ICD-10-CM | POA: Diagnosis not present

## 2022-06-29 DIAGNOSIS — M9902 Segmental and somatic dysfunction of thoracic region: Secondary | ICD-10-CM | POA: Diagnosis not present

## 2022-06-29 DIAGNOSIS — M542 Cervicalgia: Secondary | ICD-10-CM | POA: Diagnosis not present

## 2022-06-29 NOTE — Telephone Encounter (Signed)
Called and spoke with pt letting him know the info per MR and he verbalized understanding. MR  had soonest opening than APP so pt has been scheduled for a visit with MR 4/12 to discuss biologics. Nothing further needed.

## 2022-06-30 ENCOUNTER — Ambulatory Visit (INDEPENDENT_AMBULATORY_CARE_PROVIDER_SITE_OTHER): Payer: PPO | Admitting: Internal Medicine

## 2022-06-30 ENCOUNTER — Encounter: Payer: Self-pay | Admitting: Internal Medicine

## 2022-06-30 VITALS — BP 128/68 | HR 57 | Temp 97.8°F | Ht 75.0 in | Wt 286.0 lb

## 2022-06-30 DIAGNOSIS — J479 Bronchiectasis, uncomplicated: Secondary | ICD-10-CM

## 2022-06-30 DIAGNOSIS — J45991 Cough variant asthma: Secondary | ICD-10-CM | POA: Diagnosis not present

## 2022-06-30 DIAGNOSIS — D721 Eosinophilia, unspecified: Secondary | ICD-10-CM | POA: Diagnosis not present

## 2022-06-30 DIAGNOSIS — J302 Other seasonal allergic rhinitis: Secondary | ICD-10-CM

## 2022-06-30 DIAGNOSIS — R490 Dysphonia: Secondary | ICD-10-CM | POA: Diagnosis not present

## 2022-06-30 NOTE — Progress Notes (Signed)
IOV 08/24/2017  Chief Complaint  Patient presents with   Consult    Referred by Dr. Irena Cords due to trouble coughing, coughing up mucus that is dark gray in color, and breathing has become worse. Pt also has c/o hoarseness that began 6 months ago. Pt's main complaint is labored breathing. Pt states he had a brother who died of pulmonary fibrosis.    Noah Huffman , 82 y.o. , with dob [birth year] and male ,Not Hispanic or Latino from [address] [place] [address] - presents to pulm clinic for dyspnea and cough.  He is originally from [place] area and moved to [place] in the late 1960s and in [place] since the 1970s.His brother died at age 61 from pulmonary fibrosis lasted at the [facility] he is also worried about that.  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  He smoked heavily over 62 pack smoking history but quit over 25 years ago.  He tells me for the last 4 to 5 years he has had shortness of breath that is slowly progre  ssive.  Definitely present with exertion relieved by rest.  Walking today in our office 185 feet x 3 laps on room air he did have some mild shortness of breath but did not desaturate.  There is no associated chest pain.    He does have a chronic cough that for which she was started on Symbicort few to several years ago and it did seem to help but in the last year or 2 the cough is deteriorated and associated with some yellow sputum production.  He does have some on and off hoarseness of voice that is constant for the last few years.  He recollects having had a ENT evaluation a few years ago that was normal.  He clears his throat.  There is excess feeling of postnasal drip there is some occasional choking episodes he finds the cough annoying and there is an occasional sensation of something sticking in his throat but there is no heartburn or dysphagia or coughing after lying down.  His RSI cough score  is 19.  He is noticed to be on losartan  In 2017 he did have a plain old exercise treadmill test that was normal.   FenO 08/24/2017 = 22 ppb on spiriva and symbicort.  Recently started on Symbicort in the last few months by Dr. Madie Reno allergist.  Patient tells me that allergy testing was negative/normal    In 2014 he had spirometry that shows both reduced FVC and FEV1 with a reduced ratio slightly favoring obstruction  He had a CT chest aug 2018 that shows some bibasilar atelectasis this was a CT chest angiography.  In 2017 he had a CT abdomen pelvis that does not show these applications.  I personally visualized the CT chest and confirmed the findings.    OV 10/08/2017  Chief Complaint  Patient presents with   Follow-up    PFT done today, clears throat,    Follow-up cough and shortness of breath. He continues to be is on Spiriva and Symbicort. In the interim his symptoms of improvement. He only function test today and his FEV1 is 71% associated with moderate obstruction. DLCO is mildly reduced. Overall he feels great. We discussed pulmonary rehabilitation but he says he will exercise on his own and will think about attending rehabilitation center if x-ray does not help. He had high resolution CT chest  that I personally visualized and it shows bilateral lower lobe bronchiectasis Currently no sputum production.  OV 01/01/2019  Subjective:  Patient ID: Noah Huffman, male , DOB: 05-28-1940 , age 82 y.o. , MRN: [id number] , ADDRESS: [address] [place] [address]   01/01/2019 -   Chief Complaint  Patient presents with   Bronchiectasis without complication    Patient said there are times when the breathing is worse, especially when hot. Recently had chest CT and was told to some to clinic to discuss.   Follow-up bilateral lower lobe bronchiectasis on Spiriva and Symbicort but with a family history of pulmonary fibrosis.  [His brother who is 5 years younger than him died in  17-Aug-2015 at the age of 73 from pulmonary fibrosis]  HPI Noah Huffman 82 y.o. -presents for follow-up.  Last seen in July 2019.  Since then he has been on Spiriva and Symbicort.  He says the shortness of breath is stable but his chronic cough with sputum production has gotten worse a little bit especially early in the morning.  He is worried he might have pulmonary fibrosis because his brother died from it.  He also had a recent CT chest of the aorta that reported the bronchiectasis.  Therefore his allergist Dr. Madie Reno is asked him to be reevaluated with [place].  Otherwise no new problems.  The only other health issue in the last 1 year is his left hip pain.        OV 01/28/2019  Subjective:  Patient ID: Noah Huffman, male , DOB: [birth year] , age 82 y.o. , MRN: [id number] , ADDRESS: [address] [place] [address]   01/28/2019 -   Chief Complaint  Patient presents with   Follow-up    PFT and CT chest performed since last visit and pt is here today to go over the results. Pt states his cough is still the same since last visit.   Follow-up bilateral lower lobe bronchiectasis on Spiriva and Symbicort but with a family history of pulmonary fibrosis.  [His brother who is 5 years younger than him died in 08/17/15 at the age of 50 from pulmonary fibrosis]  HPI Noah Huffman 82 y.o. -returns for follow-up of her pulmonary function testing and high-resolution CT chest.  The high-resolution CT chest is again described as bronchiectasis in the bilateral lower lobes.  To me it looks a little bit like reticulation on my personal visualization and I wonder if there is early fibrosis.  Of note, somebody in radiology told him that he had pulmonary fibrosis and he got upset.  He is very scared about getting pulmonary fibrosis because his brother died from the same.  Regardless the level of radiologic abnormality appears to be very mild.  His pulmonary function test itself is stable compared to a year  ago.  However his symptoms appear to be out of proportion to the pulmonary findings on the CT scan.  He tells me that his cough is severe RSI cough score is 27.  He clears his throat a lot.  He feels like there is lot of phlegm in the throat.  When I asked him if there is a sensation there is a "frog in the throat" he did replied in the affirmative.  He clears her throat a lot.  He did that even during the office visit.  But the cough is stable compared to a year ago  His shortness of breath he states is worse.  Class  III on exertion relieved by rest.  He has significant visceral obesity.  He says his been tested for sleep apnea 3 times in the past but this has been negative.  His last cardiac stress test was 08/08/15 a plain old treadmill test that I reviewed the result and was normal.  It has been several years since he had an echocardiogram.  He is willing to have another echocardiogram and if this is normal undergo pulmonary stress test.  He is reporting new onset of hoarseness of voice for the last 1 year.  It seems to be intermittent.  He states his wife is also getting it.  He has previous history of smoking.  Has been on Symbicort for 7 years and Spiriva recently.  He has not seen ENT in several years or never seen them at all.    CT chest high resolution  IMPRESSION: 1. No evidence of interstitial lung disease. 2. Mild bibasilar bronchiectasis and scarring, as before. 3. Aortic atherosclerosis (ICD10-170.0). Coronary artery calcification.     Electronically Signed   By: Leanna Battles M.D.   On: 01/24/2019 16:29   OV 04/09/2019  Subjective:  Patient ID: Noah Huffman, male , DOB: 12-Feb-1941 , age 3 y.o. , MRN: [id number] , ADDRESS: [address] [place] [address]  Follow-up bilateral lower lobe bronchiectasis on Spiriva and Symbicort but with a family history of pulmonary fibrosis.  [His brother who is 5 years younger than him died in 08/08/2015 at the age of 62 from pulmonary  fibrosis]   04/09/2019 -   Chief Complaint  Patient presents with   Follow-up    f/u bronchiectasis, minimal cough with phlegm with cloudy mucus     HPI Noah Huffman 82 y.o. -presents for his follow-up of multiple issues.  He is on Spiriva and Symbicort but despite this he has significant amount of cough he is clearing his throat all the time including even at this visit.  He says he has associated dry mouth.  He is already on pre-existing gabapentin.  Med review shows losartan.  He saw Dr. Jodean Lima and ENT after my referral to him at the last visit.  Apparently has dry mouth and the trying some humidification.  He is some better with his cough.  His chronic shortness of breath continues.  He had an echocardiogram that shows diastolic dysfunction.  He now tells me that because of the pandemic he stopped exercising at the [facility] and he regained 20 pounds and this then contributed the shortness of breath.  He initially agreed for pulmonary stress test but after discussing this with him we think the most likely etiology is obesity, diastolic dysfunction physical deconditioning therefore going to hold off.  I discussed his bilateral lower lobe bronchiectasis at the case conference.  He is very concerned about pulmonary fibrosis because his brother passed away from it.  He also has a previous 60 pack smoking and therefore he is a high risk candidate for pulmonary fibrosis especially being male, Caucasian and age greater than 53.  However the case conference it was resolved that he only had bronchiectasis and no evidence of fibrosis.  Review of the labs indicate that he still needs to have bronchiectasis work-up    OV 01/14/2020   Subjective:  Patient ID: Noah Huffman, male , DOB: 06-05-1940, age 6 y.o. years. , MRN: [id number],  ADDRESS: [address] [place] [address] PCP  Creola Corn, MD Providers : Treatment Team:  Attending Provider: Marchelle Gearing,  Carmin Muskrat, MD Patient Care Team: Creola Corn, MD as PCP - General (Internal Medicine) Lanier Prude, MD as PCP - Electrophysiology (Cardiology)    Chief Complaint  Patient presents with   Follow-up    worsening chest discomfort over last 2 months, incrfeased pain with sneezing     Follow-up bilateral lower lobe bronchiectasis on Spiriva and Symbicort but with a family history of pulmonary fibrosis.  [His brother who is 5 years younger than him died in Aug 13, 2015 at the age of 41 from pulmonary fibrosiAnd a first cousin in 2021s ]    HPI Noah Huffman 75 y.o. -presents for follow-up.  Last seen in early part of the year.  Since then overall shortness of breath and wheezing and cough are stable.  He says he has had atrial flutter and then dealing with urinary tract infections.  He has appointment with his urologist.  He does not believe he is ever taken Macrobid.  He is now on Eliquis.  His right shoulder is bothering him and he plans to have surgery.  In terms of shortness of breath cough and wheezing these are stable.  But he tells me that he has since spring 2021 this intermittent pain in the right supramammary/infraclavicular area.  This is dull and constant.  He thinks it might be related to the shoulder but he is worried it might be related to the lungs.  He tells me that this year another first cousin died from pulmonary fibrosis he is really worried that he might be getting pulmonary fibrosis.   Earlier in the year his autoimmune antibodies were negative but blood IgE was slightly high he still continue Spiriva and Symbicort which he thinks is helping him.  He wants refills for this.  01/24/2021: OV with Dr. Maple Hudson for f/u after sleep study. Discussed results. Concerned about nasal mask d/t not much airflow from previous nasal fx. Also concerned about claustrophobia with full face mask. Tx recently by PCP for AECOPD with prednisone and abx. Continued Breztri and PRN albuterol. Pt decided to wait to move forward with CPAP therapy  or oral appliance until he discussed effects of untreated mild OSA on his a fib with his cardiologist. Continued on nocturnal 2 lpm supplemental O2.  05/19/2021: OV with Cobb NP.  Worsening shortness of breath and ongoing productive cough over the last 2 to 3 months.  Felt as though activity intolerance had declined.  Had been treated a few times for AECOPD by his PCP with prednisone and antibiotics over the last 6 months.  Also has some increasing allergy type symptoms including runny nose, itchy eyes and scratchy throat.  Started take over-the-counter allergy medicine from [facility] and uses Flonase and saline nasal spray.  Previously had met with Dr. Maple Hudson to discuss mild OSA and treatment options.  Still unsure at this visit what he would like to do next.  Continued on 2 L/min supplemental O2 at night.  Continued on triple therapy regimen with Breztri.  CBC with differential and IgE obtained which showed significant eosinophilia and elevated IgE.  Started on Singulair and Xyzal.  Provided with short prednisone burst.  Plans to obtain FeNO at follow-up.  CXR without any evidence of superimposed infection  06/02/2021:   82 year old male, former smoker (62.5-pack-year history) followed for COPD Gold 3, bronchiectasis, chronic cough and OSA.  He is a patient of Dr. Jane Canary and also followed by Dr. Maple Hudson for sleep.  Last seen in office on 05/19/2021 by Cobb NP.  Past medical history significant for history of asthma, hypertension, A-fib, GERD, OA, BPH, obesity, HLD.  Patient presents today for follow-up.  Feels as though his breathing is slightly improved although he does still continue to experience shortness of breath from exertion.  Feels as though he still has activity limitations.  Does feel like his cough has improved and is not as frequent as it used to be.  Production has mostly resolved.  Does continue to have persistent allergy symptoms with itchy eyes and sneezing.  Started on Xyzal and Singulair at  his last visit.  Denies lower extremity edema, orthopnea or PND, hemoptysis.  Has seen an allergist in the past.  Continued on Breztri twice daily.  Rarely uses albuterol.  FeNO 23 ppb    TEST/EVENTS:  04/27/2013 and PSG: AHI 16.5/h, desaturation 87% 01/30/2020 HRCT chest: Atherosclerosis, CAD.  Mild diffuse bilateral bronchial wall thickening.  Mild band appearing scarring and volume loss of bases, left greater than right; similar in appearance when compared to previous. 09/03/2019 echo: On 2 L/min, showed sustained hypoxemia and bradycardia 12/24/2020 NPSG: AHI 9/h, desaturation 85% 02/18/2021 PFTs: FVC 3.46 (70), FEV1 2.4 (67, ratio 69, TLC 91%, DLCO uncorrected 85%.  Moderate obstructive airway disease with normal diffusion capacity.  No BD  OV 06/21/2021  Subjective:  Patient ID: Noah Huffman, male , DOB: [birth year] , age 70 y.o. , MRN: [id number] , ADDRESS: [address] [place] [address] PCP [name], MD Patient Care Team: [name], MD as PCP - General (Internal Medicine) Vickie Epley, MD as PCP - Electrophysiology (Cardiology)  This Provider for this visit: Treatment Team:  Attending Provider: Brand Males, MD    06/21/2021 -   Chief Complaint  Patient presents with   Follow-up    Follow up for COPD. Pt just finished prednisone. Pt is on Breztri and albuterol. Pt states his breathing si doing better.      HPI Noah Huffman 82 y.o. - regular OV.  SAw APP and says she gave lot of medicines for sinuses and then better with less congestion . She referred him to allergies at [facility]. Not yet seeen. He has seen Dr Josip Hedge in past and prefers to go there, [place]  on astelin, teassoli, flonase, mucinex, xyxal, singulair, breztri and neurontin. Last PFT dec 2022 and slightly down over time . LAST HRCT Nov 2021     04/05/2022: OV with Cobb NP for acute visit. He was doing better after I saw him last. He went to pulmonary rehab as well, which he's not  sure made a huge difference but he graduated in October. He did not continue to do the exercises afterwards. He's not sure if this is the reason why but he has been more short of breath over the last 2-3 months. Feels like he hasn't been able to complete activities around the house without getting out of breath. Then over the past two weeks, he started having more wheezing, chest congestion, and a productive cough with cream colored sputum, mostly in AM. He also has postnasal drainage and feels like he wakes up choked in the morning from the mucus. He denies fevers, chills, hemoptysis, orthopnea, PND, leg swelling. He is on [employer]. He uses his flonase and astelin daily. Takes mucinex twice a day. He is not still on xyzal or singulair. Not sure when he stopped these.  HRCT chest from August 2023 did not show any ILD/IPF. He did have some mucous plugging and mild air trapping.  FeNO 25 ppb. CXR with concern for opacity at left lung base, consistent with pna given infectious symptoms. Eosinophils remain significantly elevated on CBC at 800. Treated with augmentin, prednisone taper. Instructed to restart xyzal and singulair.   04/19/2022: Ov with Cobb NP for follow up after being treated for pneumonia and AECOPD/asthma. He has completed augmentin and prednisone courses. He is feeling much better. His breathing feelings back to his baseline. His cough is also improved. Still occasionally productive with white/cream sputum. It has decreased and is not as thick. Chest congestion and wheezing have resolved. His nasal symptoms are also better. He still feels like he has some pressure in his ears; no pain or drainage. Going to see his audiologist tomorrow. He did restart xyzal and singulair since he was here last.   05/31/2022: Today - follow up Patient presents today for follow up but also having some acute symptoms. He thinks it may be due to allergies but over the past weeks, he's been more short of breath and has a  productive cough with brown sputum. Noticing some wheezing. Having more chest congestion/tightness. Denies fevers, chills, URI symptoms, hemoptysis, leg swelling, orthopnea. He has used his Markus Daft an extra time for the past few days. Feels like this helps more than his albuterol does. Taking singulair and xyzal.   FeNO 18 ppb   OV 06/15/2022  Subjective:  Patient ID: Noah Huffman, male , DOB: [birth year] , age 27 y.o. , MRN: [id number] , ADDRESS: [address] [place] [address] PCP Creola Corn, MD Patient Care Team: Creola Corn, MD as PCP - General (Internal Medicine) Lanier Prude, MD as PCP - Electrophysiology (Cardiology)  This Provider for this visit: Treatment Team:  Attending Provider: Kalman Shan, MD    06/15/2022 -   Chief Complaint  Patient presents with   Follow-up    F/up      HPI Noah Huffman 82 y.o. -returns for follow-up.  I last saw him in April 2023.  After that he only showed up for follow-up with Rhunette Croft nurse practitioner in early 2024.  I am seeing him now after a year.  He says overall he is doing well.  However earlier in the year 2024 he did have respiratory exacerbations and needed steroids and treatment by Rhunette Croft.  He states he is slowly getting back to baseline but he is troubled by hoarseness of voice.  He says his courses of voice is going on on and off for over a year.  He has not seen ENT for this.  He recollects in the past that sent him to ENT but only his sinuses was inspected.  He is willing to see ENT again.  In addition he tells me that he is having chronic cough with half cup of sputum that is grayish and a day.  He states he brings up sputum at least every 30 minutes.  It is more in volume since the recent exacerbations but overall is a chronic problem for the last few years.  Review of the medication shows that Florentina Addison tried him on air supra but it was too expensive.  He is also telling me that he is taking Arnuity  and Breztri at the same time although I do not see documentation clearly as to why.  I told him to stop the Arnuity but still take the breztri.  He continues his other medications including antihistamines.  His blood eosinophils continue to be high on a constant basis [see  below].  His nitric oxide recently was normal.  But this is on a steroid inhaler.  He continues to be symptomatic with his cough and sputum production.  He is willing to try 3% saline nebulizer.  He is willing to get his blood eosinophils retested.  Last pulmonary function test was in 07/27/2020 Last CT scan of the chest was in August 2023.    OV 06/30/2022  Subjective:  Patient ID: Noah Huffman, male , DOB: [birth year] , age 89 y.o. , MRN: [id number] , ADDRESS: [address] [place] [address] PCP Creola Corn, MD Patient Care Team: Creola Corn, MD as PCP - General (Internal Medicine) Lanier Prude, MD as PCP - Electrophysiology (Cardiology)  This Provider for this visit: Treatment Team:  Attending Provider: Kalman Shan, MD     Follow-up bilateral lower lobe bronchiectasis on Spiriva and Symbicort but with a family history of pulmonary fibrosis.  [His brother who is 5 years younger than him died in 2015-07-28 at the age of 80 from pulmonary fibrosiAnd a first cousin in 2021s ]  Chronic cough with sputum production [high eosinophils, allergy visit in 07/28/2018 04/2021 to Dr. Madie Reno reassured] Hoarseness of voice    06/30/2022 -   Chief Complaint  Patient presents with   Follow-up    Injections      HPI ADELINO PETREE 82 y.o. -returns for follow-up to discuss the test results.  His blood eosinophils are high.  He tells me that he does not have a cough but he has a lot of sputum production that is yellow in color.  Last visit we also given 3% saline nebulizer but this has not been sent yet according to him.  We will follow-up on that.  Specific purpose of this visit is to discuss biologic therapy.  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  He wants to know if biologic therapy would help his hoarseness of voice.  We went over the various options with biologic therapy and the risks and benefits in general.  Did indicate to him that potential benefit will not be known for 3-6 months.  Side effect profile is largely reassuring.  They have been in the market for several years now.  He is willing to start himself on biologic therapy against eosinophils.  Will do the paperwork for this.       Simple office walk 185 feet x  3 laps goal with forehead probe 08/24/2017   O2 used Room air  Number laps completed 3  Comments about pace Normal pace,, tall man,   Resting Pulse Ox/HR 98% and 61/min  Final Pulse Ox/HR 96% and 88/min  Desaturated </= 88% no  Desaturated <= 3% points no  Got Tachycardic >/= 90/min no  Symptoms at end of test Mild dyspneic  Miscellaneous comments x   Results for EDIN, COMAR (MRN [id number]) as of 01/28/2019 10:58  Ref. Range 10/08/2017 11:44 01/21/2019 13:47  FVC-Pre Latest Units: L 4.03 3.73  FVC-%Pred-Pre Latest Units: % 79 74  Results for ROME, WOLPERT (MRN [id number]) as of 01/28/2019 10:58  Ref. Range 10/08/2017 11:44 01/21/2019 13:47  TLC Latest Units: L 6.82 7.91  TLC % pred Latest Units: % 84 98  Results for DMARIUS, KEHL (MRN [id number]) as of 01/28/2019 10:58  Ref. Range 10/08/2017 11:44 01/21/2019 13:47  DLCO unc Latest Units: ml/min/mmHg 28.61 25.55  DLCO unc % pred Latest Units: % 73  88     PFT     Latest Ref Rng & Units 02/18/2021   11:18 AM 03/15/2020   11:59 AM 01/21/2019    1:47 PM 10/08/2017   11:44 AM  PFT Results  FVC-Pre L 3.43  3.44  3.73  4.03   FVC-Predicted Pre % 69  69  74  79   FVC-Post L 3.46  3.37  3.69  3.68   FVC-Predicted Post % 70  67  73  72   Pre FEV1/FVC % % 69  73  64  71   Post FEV1/FCV % % 69  73  70  72   FEV1-Pre L 2.37  2.51  2.40  2.85   FEV1-Predicted Pre %  66  69  65  77   FEV1-Post L 2.40  2.45  2.58  2.63   DLCO uncorrected ml/min/mmHg 24.33  28.29  25.55  28.61   DLCO UNC% % 85  98  88  73   DLCO corrected ml/min/mmHg 24.33  28.29     DLCO COR %Predicted % 85  98     DLVA Predicted % 98  108  108  83   TLC L 7.35  7.20  7.91  6.82   TLC % Predicted % 91  89  98  84   RV % Predicted % 117  104  139  91      Latest Reference Range & Units 11/30/19 09:27 05/19/21 11:30 04/05/22 12:20 06/15/22 15:00  Eosinophils Absolute 0.0 - 0.7 K/uL 0.1 0.8 (H) 0.8 (H) 0.5  (H): Data is abnormally high   has a past medical history of Allergy, Asthma, Atrial fibrillation, BPH (benign prostatic hypertrophy) with urinary obstruction, Cataract, COPD (chronic obstructive pulmonary disease), Diverticulosis, Dysrhythmia, GERD (gastroesophageal reflux disease), Hyperglycemia, Hyperlipidemia, Hypertension, Inguinal hernia, Neuromuscular disorder, Obesity, OSA (obstructive sleep apnea) (06/06/2013), Osteoarthritis, and Sleep apnea.   reports that he quit smoking about 34 years ago. His smoking use included cigarettes. He has a 62.50 pack-year smoking history. He has never used smokeless tobacco.  Past Surgical History:  Procedure Laterality Date   A-FLUTTER ABLATION N/A 01/02/2020   Procedure: A-FLUTTER ABLATION;  Surgeon: Lanier Prude, MD;  Location: [facility] INVASIVE CV LAB;  Service: Cardiovascular;  Laterality: N/A;   APPENDECTOMY     arthroscopic knee surgery     CATARACT EXTRACTION  2012   bilateral   COLONOSCOPY     HAMMER TOE SURGERY     INGUINAL HERNIA REPAIR     right   POLYPECTOMY     REVERSE SHOULDER ARTHROPLASTY Right 03/26/2020   Procedure: REVERSE SHOULDER ARTHROPLASTY;  Surgeon: Beverely Low, MD;  Location: [facility] ORS;  Service: Orthopedics;  Laterality: Right;  interscalane block   TONSILLECTOMY AND ADENOIDECTOMY     TOTAL HIP ARTHROPLASTY  2007   right    Allergies  Allergen Reactions   Macrobid [Nitrofurantoin]    Morphine And Related  Rash    Immunization History  Administered Date(s) Administered   Fluad Quad(high Dose 65+) 12/02/2018, 12/24/2019   Influenza Split 04/13/2011, 03/20/2012, 04/18/2012   Influenza, Quadrivalent, Recombinant, Inj, Pf 12/24/2019, 04/07/2021   Influenza,inj,Quad PF,6+ Mos 01/14/2017   Influenza,inj,quad, With Preservative 12/11/2018   PFIZER(Purple Top)SARS-COV-2 Vaccination 05/15/2019, 06/10/2019   Pneumococcal Conjugate-13 02/28/2013, 04/21/2013, 05/06/2015   Pneumococcal Polysaccharide-23 01/23/2007, 05/09/2016, 06/06/2022   Td 03/21/2004, 08/25/2013, 08/25/2013   Td (Adult) 08/25/2013   Unspecified SARS-COV-2 Vaccination 05/21/2019    Family History  Problem Relation Age of Onset  Leukemia Father    Transient ischemic attack Father    Pneumonia Father    Colon cancer Mother 55   Brain cancer Brother    Pulmonary fibrosis Brother    Colon polyps Neg Hx    Esophageal cancer Neg Hx    Rectal cancer Neg Hx    Stomach cancer Neg Hx      Current Outpatient Medications:    aspirin EC 81 MG tablet, Take 1 tablet (81 mg total) by mouth daily. Swallow whole., Disp: 90 tablet, Rfl: 3   azelastine (ASTELIN) 0.1 % nasal spray, USE 2 SPRAYS IN EACH NOSTRIL TWICE DAILY AS DIRECTED, Disp: 30 mL, Rfl: 3   BREZTRI AEROSPHERE 160-9-4.8 MCG/ACT AERO, INHALE 2 PUFFS BY MOUTH IN THE MORNING AND AT BEDTIME, Disp: 10.7 g, Rfl: 5   fluticasone (FLONASE) 50 MCG/ACT nasal spray, Place 1 spray into both nostrils daily., Disp: , Rfl:    gabapentin (NEURONTIN) 600 MG tablet, Take 1,800 mg by mouth 3 (three) times daily., Disp: , Rfl:    guaiFENesin (MUCINEX) 600 MG 12 hr tablet, Take 1 tablet (600 mg total) by mouth 2 (two) times daily., Disp: 60 tablet, Rfl: 3   levocetirizine (XYZAL) 5 MG tablet, Take 1 tablet (5 mg total) by mouth every evening., Disp: 90 tablet, Rfl: 1   losartan-hydrochlorothiazide (HYZAAR) 100-25 MG tablet, Take 1 tablet by mouth daily., Disp: , Rfl:    montelukast (SINGULAIR) 10  MG tablet, Take 1 tablet (10 mg total) by mouth at bedtime., Disp: 30 tablet, Rfl: 5   Multiple Vitamins-Minerals (PRESERVISION AREDS 2) CAPS, Take 1 capsule by mouth daily. , Disp: , Rfl:    polyvinyl alcohol (LIQUIFILM TEARS) 1.4 % ophthalmic solution, Place 1 drop into both eyes 2 (two) times daily., Disp: , Rfl:    rosuvastatin (CRESTOR) 10 MG tablet, Take 10 mg by mouth at bedtime., Disp: , Rfl:    sodium chloride (OCEAN) 0.65 % SOLN nasal spray, Place 1 spray into both nostrils daily., Disp: , Rfl:    sodium chloride HYPERTONIC 3 % nebulizer solution, Take by nebulization in the morning and at bedtime. Dx: J47.9, Disp: 750 mL, Rfl: 12   Spacer/Aero-Holding Chambers (AEROCHAMBER MV) inhaler, Use as instructed, Disp: 1 each, Rfl: 0   tamsulosin (FLOMAX) 0.4 MG CAPS capsule, Take 0.4 mg by mouth daily., Disp: , Rfl:    Albuterol-Budesonide (AIRSUPRA) 90-80 MCG/ACT AERO, Inhale 1-2 puffs into the lungs every 4 (four) hours as needed (shortness of breath, wheezing, chest tightness). (Patient not taking: Reported on 06/15/2022), Disp: 10.7 g, Rfl: 5   Fluticasone Furoate (ARNUITY ELLIPTA) 100 MCG/ACT AEPB, Inhale 1 puff into the lungs daily. (Patient not taking: Reported on 06/15/2022), Disp: 30 each, Rfl: 3   predniSONE (DELTASONE) 10 MG tablet, 4 tabs for 2 days, then 3 tabs for 2 days, 2 tabs for 2 days, then 1 tab for 2 days, then stop (Patient not taking: Reported on 06/15/2022), Disp: 20 tablet, Rfl: 0  Current Facility-Administered Medications:    0.9 %  sodium chloride infusion, 500 mL, Intravenous, Once, Hilarie Fredrickson, MD   albuterol (PROVENTIL) (2.5 MG/3ML) 0.083% nebulizer solution 2.5 mg, 2.5 mg, Nebulization, Q6H PRN, Cobb, Ruby Cola, NP   methylPREDNISolone acetate (DEPO-MEDROL) injection 80 mg, 80 mg, Intramuscular, Once, Cobb, Ruby Cola, NP      Objective:   Vitals:   06/30/22 0828  BP: 128/68  Pulse: (!) 57  Temp: 97.8 F (36.6 C)  TempSrc: Oral  SpO2: 97%  Weight: 286  lb (129.7 kg)  Height: 6\' 3"  (1.905 m)    Estimated body mass index is 35.75 kg/m as calculated from the following:   Height as of this encounter: 6\' 3"  (1.905 m).   Weight as of this encounter: 286 lb (129.7 kg).  @WEIGHTCHANGE @  [employer]   06/30/22 0828  Weight: 286 lb (129.7 kg)     Physical Exam   General: No distress. Looks well Neuro: Alert and Oriented x 3. GCS 15. Speech normal Psych: Pleasant Resp:  Barrel Chest - no.  Wheeze - no, Crackles - no, No overt respiratory distress CVS: Normal heart sounds. Murmurs - no Ext: Stigmata of Connective Tissue Disease - no HEENT: Normal upper airway. PEERL +. No post nasal drip        Assessment:       ICD-10-CM   1. Bronchiectasis without complication  J47.9     2. Seasonal allergic rhinitis, unspecified trigger  J30.2     3. 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 Hoarse voice quality  R49.0     5. Eosinophilia, unspecified type  D72.10          Plan:     Patient Instructions  Family history of pulmonary fibrosis Bronchiectasis without complication (HCC) Previous 60 pack smoking history Clinical COPD Sinus congestion and history of seasonal allergies    - PFT dec 2022 might be slightly down since 2019 - Last CT -August 2023 and stable - Noted sinus symptoms much better after Katie started several sinus/anti-histamine allergy meds - Glad Irena Cords visit in 2022/2023 for allergies was reassuring but you have high eosinophiuls -High eosinophils likely causeing cough variant asthma and sputum production  Plan - START Fasenra ; 3-6 month trial needed to see if sputum +/- hoarseness will improved - -START 3mL of 3% hypertonic saline nebulizer twice daily   -Continue Albuterol inhaler 2 puffs or 3 mL neb every 6 hours as needed  -Continue Breztri 2 puffs Twice daily.  - Brush tongue and rinse mouth well afterwards. Use with spacer.  -Continue flonase 2 spray each nostril daily -Continue saline nasal  spray 1 spray each nostril 1-2 times a day  -Continue on supplemental oxygen 2 lpm at night -Continue Mucinex 600 mg Twice daily for chest congestion -Continue Astelin nasal spray 2 sprays each nostril Twice daily  - Continue Xyzal (levocetirizine) 10 mg daily for allergies in the morning -Continue Singulair (montelukast) 10 mg At bedtime     HOARSENESS OF VOICe  Plan  - refer ENT   Follow up  in 3 months with  Dr. Marchelle Gearing. But afer spiro/dlco     SIGNATURE    Dr. Kalman Shan, M.D., F.C.C.P,  Pulmonary and Critical Care Medicine Staff Physician, [facility] Center Director - Interstitial Lung Disease  Program  Pulmonary Fibrosis [facility] at [facility] [place], [place], [address]  Pager: [phone], If no answer or between  15:00h - 7:00h: call [phone] Telephone: [phone]  8:45 AM 06/30/2022

## 2022-06-30 NOTE — Patient Instructions (Addendum)
Family history of pulmonary fibrosis Bronchiectasis without complication (HCC) Previous 60 pack smoking history Clinical COPD Sinus congestion and history of seasonal allergies    - PFT dec 2022 might be slightly down since 2019 - Last CT -August 2023 and stable - Noted sinus symptoms much better after Katie started several sinus/anti-histamine allergy meds - Glad Irena Cords visit in 2022/2023 for allergies was reassuring but you have high eosinophiuls -High eosinophils likely causeing cough variant asthma and sputum production  Plan - START Fasenra ; 3-6 month trial needed to see if sputum +/- hoarseness will improved - -START 52mL of 3% hypertonic saline nebulizer twice daily   -Continue Albuterol inhaler 2 puffs or 3 mL neb every 6 hours as needed  -Continue Breztri 2 puffs Twice daily.  - Brush tongue and rinse mouth well afterwards. Use with spacer.  -Continue flonase 2 spray each nostril daily -Continue saline nasal spray 1 spray each nostril 1-2 times a day  -Continue on supplemental oxygen 2 lpm at night -Continue Mucinex 600 mg Twice daily for chest congestion -Continue Astelin nasal spray 2 sprays each nostril Twice daily  - Continue Xyzal (levocetirizine) 10 mg daily for allergies in the morning -Continue Singulair (montelukast) 10 mg At bedtime     HOARSENESS OF VOICe  Plan  - refer ENT   Follow up  in 3 months with  Dr. Marchelle Gearing. But afer spiro/dlco

## 2022-06-30 NOTE — Addendum Note (Signed)
Addended by: Glynda Jaeger on: 06/30/2022 10:47 AM   Modules accepted: Orders

## 2022-07-03 ENCOUNTER — Telehealth: Payer: Self-pay | Admitting: Pharmacist

## 2022-07-03 DIAGNOSIS — J479 Bronchiectasis, uncomplicated: Secondary | ICD-10-CM | POA: Diagnosis not present

## 2022-07-03 NOTE — Telephone Encounter (Signed)
Received new start paperwork for Fasenra. Based on income written by patient on AZ&Me application, patient will certainly not qualify for pt assistance program.  Application placed in PAP pending info folder in pharmacy office  Submitted a Prior Authorization request to  [employer]  for [facility] via CoverMyMeds. Will update once we receive a response.  Key: Phylis Bougie, PharmD, MPH, BCPS, CPP Clinical Pharmacist (Rheumatology and Pulmonology)

## 2022-07-05 NOTE — Telephone Encounter (Signed)
Pt has been seen since calling the office. Closing encounter. 

## 2022-07-05 NOTE — Telephone Encounter (Signed)
PA has not sent on CMM. Physical form completed and faxed to [employer] for [place] today  Fax: [phone] Phone: [phone]  Chesley Mires, PharmD, MPH, BCPS, CPP Clinical Pharmacist (Rheumatology and Pulmonology)

## 2022-07-06 ENCOUNTER — Other Ambulatory Visit (HOSPITAL_COMMUNITY): Payer: Self-pay | Admitting: Internal Medicine

## 2022-07-06 DIAGNOSIS — R600 Localized edema: Secondary | ICD-10-CM

## 2022-07-06 DIAGNOSIS — I2721 Secondary pulmonary arterial hypertension: Secondary | ICD-10-CM | POA: Diagnosis not present

## 2022-07-06 DIAGNOSIS — I4892 Unspecified atrial flutter: Secondary | ICD-10-CM | POA: Diagnosis not present

## 2022-07-06 DIAGNOSIS — I10 Essential (primary) hypertension: Secondary | ICD-10-CM | POA: Diagnosis not present

## 2022-07-07 ENCOUNTER — Ambulatory Visit (HOSPITAL_COMMUNITY)
Admission: RE | Admit: 2022-07-07 | Discharge: 2022-07-07 | Disposition: A | Discharge status: Home / Self Care | Payer: PPO | Source: Ambulatory Visit | Attending: Internal Medicine | Admitting: Internal Medicine

## 2022-07-07 DIAGNOSIS — R600 Localized edema: Secondary | ICD-10-CM | POA: Insufficient documentation

## 2022-07-10 ENCOUNTER — Other Ambulatory Visit (HOSPITAL_COMMUNITY): Payer: Self-pay

## 2022-07-10 NOTE — Telephone Encounter (Signed)
Received notification from  [employer]  regarding a prior authorization for [facility]. Authorization has been APPROVED from 07/05/2022 to 01/04/2023. Approval letter sent to scan center.  Per test claim, copay for 28 days supply (loading dose) is $1,982.46  Patient can fill through [facility]: [phone]   Authorization # [id number]   Will work on finalizing PAP application for submission.

## 2022-07-11 NOTE — Telephone Encounter (Signed)
Submitted Patient Assistance Application to AZ&ME for [facility] along with provider portion, insurance info and PA. Will update patient when we receive a response.  Phone #: [phone] Fax #: [phone]

## 2022-07-13 NOTE — Telephone Encounter (Signed)
Received fax from AZ&ME for [facility] patient assistance, patient's application has been DENIED due to exceeding financial threshold of exceeding 300% FPL for household size.    Phone #: [phone] Fax #: [phone]

## 2022-07-17 ENCOUNTER — Ambulatory Visit (INDEPENDENT_AMBULATORY_CARE_PROVIDER_SITE_OTHER): Payer: PPO

## 2022-07-17 DIAGNOSIS — I483 Typical atrial flutter: Secondary | ICD-10-CM

## 2022-07-17 LAB — CUP PACEART REMOTE DEVICE CHECK
Date Time Interrogation Session: 20240428231040
Implantable Pulse Generator Implant Date: 20211122

## 2022-07-17 NOTE — Telephone Encounter (Signed)
Spoke with patient regarding Noah Huffman patient assistance denial. He states he is not willing to pay $3500 for a drug that may or may not work for his condition. Unfortunately other asthma biologic patient assistance programs will also deny him. Routing to Dr. Marchelle Gearing as Noah Huffman and if he has any other recommendations for patient.  Chesley Mires, PharmD, MPH, BCPS, CPP Clinical Pharmacist (Rheumatology and Pulmonology)

## 2022-07-18 DIAGNOSIS — M545 Low back pain, unspecified: Secondary | ICD-10-CM | POA: Diagnosis not present

## 2022-07-18 DIAGNOSIS — M62830 Muscle spasm of back: Secondary | ICD-10-CM | POA: Diagnosis not present

## 2022-07-18 DIAGNOSIS — M62838 Other muscle spasm: Secondary | ICD-10-CM | POA: Diagnosis not present

## 2022-07-18 DIAGNOSIS — M9903 Segmental and somatic dysfunction of lumbar region: Secondary | ICD-10-CM | POA: Diagnosis not present

## 2022-07-18 DIAGNOSIS — M9902 Segmental and somatic dysfunction of thoracic region: Secondary | ICD-10-CM | POA: Diagnosis not present

## 2022-07-18 DIAGNOSIS — M546 Pain in thoracic spine: Secondary | ICD-10-CM | POA: Diagnosis not present

## 2022-07-18 DIAGNOSIS — M542 Cervicalgia: Secondary | ICD-10-CM | POA: Diagnosis not present

## 2022-07-18 DIAGNOSIS — M9901 Segmental and somatic dysfunction of cervical region: Secondary | ICD-10-CM | POA: Diagnosis not present

## 2022-07-19 DIAGNOSIS — M9901 Segmental and somatic dysfunction of cervical region: Secondary | ICD-10-CM | POA: Diagnosis not present

## 2022-07-19 DIAGNOSIS — M9902 Segmental and somatic dysfunction of thoracic region: Secondary | ICD-10-CM | POA: Diagnosis not present

## 2022-07-19 DIAGNOSIS — M62838 Other muscle spasm: Secondary | ICD-10-CM | POA: Diagnosis not present

## 2022-07-19 DIAGNOSIS — M546 Pain in thoracic spine: Secondary | ICD-10-CM | POA: Diagnosis not present

## 2022-07-19 DIAGNOSIS — M542 Cervicalgia: Secondary | ICD-10-CM | POA: Diagnosis not present

## 2022-07-19 DIAGNOSIS — M545 Low back pain, unspecified: Secondary | ICD-10-CM | POA: Diagnosis not present

## 2022-07-19 DIAGNOSIS — M62830 Muscle spasm of back: Secondary | ICD-10-CM | POA: Diagnosis not present

## 2022-07-19 DIAGNOSIS — M9903 Segmental and somatic dysfunction of lumbar region: Secondary | ICD-10-CM | POA: Diagnosis not present

## 2022-07-19 NOTE — Telephone Encounter (Signed)
Tere is  a patient I saw yesterday 07/18/22 afternoon who quit dupixent and has dono samples. He cpuld try it to see if it helps

## 2022-07-19 NOTE — Telephone Encounter (Signed)
Dr. Marchelle Gearing - Dupixent has to remain refrigerated so not sure how that donor supply is stored. We do not re-dispense any refrigerated medications due to lack of information on storage stability during transport, etc.  We can provide him manufacturer-supplied medication Dupixent samples if you'd really want him to try for 3 months. Let us know  Chesley Mires, PharmD, MPH, BCPS, CPP Clinical Pharmacist (Rheumatology and Pulmonology)

## 2022-07-20 DIAGNOSIS — M545 Low back pain, unspecified: Secondary | ICD-10-CM | POA: Diagnosis not present

## 2022-07-20 DIAGNOSIS — M9901 Segmental and somatic dysfunction of cervical region: Secondary | ICD-10-CM | POA: Diagnosis not present

## 2022-07-20 DIAGNOSIS — M62838 Other muscle spasm: Secondary | ICD-10-CM | POA: Diagnosis not present

## 2022-07-20 DIAGNOSIS — M9903 Segmental and somatic dysfunction of lumbar region: Secondary | ICD-10-CM | POA: Diagnosis not present

## 2022-07-20 DIAGNOSIS — M542 Cervicalgia: Secondary | ICD-10-CM | POA: Diagnosis not present

## 2022-07-20 DIAGNOSIS — M546 Pain in thoracic spine: Secondary | ICD-10-CM | POA: Diagnosis not present

## 2022-07-20 DIAGNOSIS — M62830 Muscle spasm of back: Secondary | ICD-10-CM | POA: Diagnosis not present

## 2022-07-20 DIAGNOSIS — M9902 Segmental and somatic dysfunction of thoracic region: Secondary | ICD-10-CM | POA: Diagnosis not present

## 2022-07-21 NOTE — Progress Notes (Signed)
[facility] Summary Report / Loop Recorder 

## 2022-07-25 DIAGNOSIS — M545 Low back pain, unspecified: Secondary | ICD-10-CM | POA: Diagnosis not present

## 2022-07-25 DIAGNOSIS — M546 Pain in thoracic spine: Secondary | ICD-10-CM | POA: Diagnosis not present

## 2022-07-25 DIAGNOSIS — M9901 Segmental and somatic dysfunction of cervical region: Secondary | ICD-10-CM | POA: Diagnosis not present

## 2022-07-25 DIAGNOSIS — M542 Cervicalgia: Secondary | ICD-10-CM | POA: Diagnosis not present

## 2022-07-25 DIAGNOSIS — M62830 Muscle spasm of back: Secondary | ICD-10-CM | POA: Diagnosis not present

## 2022-07-25 DIAGNOSIS — M9903 Segmental and somatic dysfunction of lumbar region: Secondary | ICD-10-CM | POA: Diagnosis not present

## 2022-07-25 DIAGNOSIS — M9902 Segmental and somatic dysfunction of thoracic region: Secondary | ICD-10-CM | POA: Diagnosis not present

## 2022-07-25 DIAGNOSIS — M62838 Other muscle spasm: Secondary | ICD-10-CM | POA: Diagnosis not present

## 2022-07-26 DIAGNOSIS — R972 Elevated prostate specific antigen [PSA]: Secondary | ICD-10-CM | POA: Diagnosis not present

## 2022-07-26 DIAGNOSIS — M542 Cervicalgia: Secondary | ICD-10-CM | POA: Diagnosis not present

## 2022-07-26 DIAGNOSIS — M9902 Segmental and somatic dysfunction of thoracic region: Secondary | ICD-10-CM | POA: Diagnosis not present

## 2022-07-26 DIAGNOSIS — M9901 Segmental and somatic dysfunction of cervical region: Secondary | ICD-10-CM | POA: Diagnosis not present

## 2022-07-26 DIAGNOSIS — M546 Pain in thoracic spine: Secondary | ICD-10-CM | POA: Diagnosis not present

## 2022-07-26 DIAGNOSIS — M545 Low back pain, unspecified: Secondary | ICD-10-CM | POA: Diagnosis not present

## 2022-07-26 DIAGNOSIS — M9903 Segmental and somatic dysfunction of lumbar region: Secondary | ICD-10-CM | POA: Diagnosis not present

## 2022-07-26 DIAGNOSIS — M62838 Other muscle spasm: Secondary | ICD-10-CM | POA: Diagnosis not present

## 2022-07-26 DIAGNOSIS — M62830 Muscle spasm of back: Secondary | ICD-10-CM | POA: Diagnosis not present

## 2022-07-26 NOTE — Telephone Encounter (Signed)
Okay let us try manufacture Dupixent samples for 3 months

## 2022-07-27 DIAGNOSIS — M9902 Segmental and somatic dysfunction of thoracic region: Secondary | ICD-10-CM | POA: Diagnosis not present

## 2022-07-27 DIAGNOSIS — M9903 Segmental and somatic dysfunction of lumbar region: Secondary | ICD-10-CM | POA: Diagnosis not present

## 2022-07-27 DIAGNOSIS — M62838 Other muscle spasm: Secondary | ICD-10-CM | POA: Diagnosis not present

## 2022-07-27 DIAGNOSIS — M542 Cervicalgia: Secondary | ICD-10-CM | POA: Diagnosis not present

## 2022-07-27 DIAGNOSIS — M62830 Muscle spasm of back: Secondary | ICD-10-CM | POA: Diagnosis not present

## 2022-07-27 DIAGNOSIS — M545 Low back pain, unspecified: Secondary | ICD-10-CM | POA: Diagnosis not present

## 2022-07-27 DIAGNOSIS — M546 Pain in thoracic spine: Secondary | ICD-10-CM | POA: Diagnosis not present

## 2022-07-27 DIAGNOSIS — M9901 Segmental and somatic dysfunction of cervical region: Secondary | ICD-10-CM | POA: Diagnosis not present

## 2022-07-31 NOTE — Telephone Encounter (Signed)
I talked with patient about plan to trial Dupixent for 3 months. Patient is scheduled for Dupixent new start on 08/02/22. Will plan to provide him 4 boxes to take home  Chesley Mires, PharmD, MPH, BCPS, CPP Clinical Pharmacist (Rheumatology and Pulmonology)

## 2022-08-01 DIAGNOSIS — M62838 Other muscle spasm: Secondary | ICD-10-CM | POA: Diagnosis not present

## 2022-08-01 DIAGNOSIS — J301 Allergic rhinitis due to pollen: Secondary | ICD-10-CM | POA: Diagnosis not present

## 2022-08-01 DIAGNOSIS — M9901 Segmental and somatic dysfunction of cervical region: Secondary | ICD-10-CM | POA: Diagnosis not present

## 2022-08-01 DIAGNOSIS — M62830 Muscle spasm of back: Secondary | ICD-10-CM | POA: Diagnosis not present

## 2022-08-01 DIAGNOSIS — M546 Pain in thoracic spine: Secondary | ICD-10-CM | POA: Diagnosis not present

## 2022-08-01 DIAGNOSIS — R052 Subacute cough: Secondary | ICD-10-CM | POA: Diagnosis not present

## 2022-08-01 DIAGNOSIS — M545 Low back pain, unspecified: Secondary | ICD-10-CM | POA: Diagnosis not present

## 2022-08-01 DIAGNOSIS — J3089 Other allergic rhinitis: Secondary | ICD-10-CM | POA: Diagnosis not present

## 2022-08-01 DIAGNOSIS — M542 Cervicalgia: Secondary | ICD-10-CM | POA: Diagnosis not present

## 2022-08-01 DIAGNOSIS — J3081 Allergic rhinitis due to animal (cat) (dog) hair and dander: Secondary | ICD-10-CM | POA: Diagnosis not present

## 2022-08-01 DIAGNOSIS — M9903 Segmental and somatic dysfunction of lumbar region: Secondary | ICD-10-CM | POA: Diagnosis not present

## 2022-08-01 DIAGNOSIS — M9902 Segmental and somatic dysfunction of thoracic region: Secondary | ICD-10-CM | POA: Diagnosis not present

## 2022-08-01 NOTE — Progress Notes (Unsigned)
HPI Patient presents today to [facility] to see pharmacy team for Dupixent new start.  Past medical history includes ***  Number of hospitalizations in past year: Number of ***COPD/asthma exacerbations in past year:   Respiratory Medications Current regimen: *** Tried in past: *** Patient reports {Adherence challenges yes no:[id number]::"adherence challenges","no known adherence challenges"}  OBJECTIVE Allergies  Allergen Reactions   Macrobid [Nitrofurantoin]    Morphine And Related Rash    Outpatient Encounter Medications as of 08/02/2022  Medication Sig   Albuterol-Budesonide (AIRSUPRA) 90-80 MCG/ACT AERO Inhale 1-2 puffs into the lungs every 4 (four) hours as needed (shortness of breath, wheezing, chest tightness). (Patient not taking: Reported on 06/15/2022)   aspirin EC 81 MG tablet Take 1 tablet (81 mg total) by mouth daily. Swallow whole.   azelastine (ASTELIN) 0.1 % nasal spray USE 2 SPRAYS IN EACH NOSTRIL TWICE DAILY AS DIRECTED   BREZTRI AEROSPHERE 160-9-4.8 MCG/ACT AERO INHALE 2 PUFFS BY MOUTH IN THE MORNING AND AT BEDTIME   fluticasone (FLONASE) 50 MCG/ACT nasal spray Place 1 spray into both nostrils daily.   Fluticasone Furoate (ARNUITY ELLIPTA) 100 MCG/ACT AEPB Inhale 1 puff into the lungs daily. (Patient not taking: Reported on 06/15/2022)   gabapentin (NEURONTIN) 600 MG tablet Take 1,800 mg by mouth 3 (three) times daily.   guaiFENesin (MUCINEX) 600 MG 12 hr tablet Take 1 tablet (600 mg total) by mouth 2 (two) times daily.   levocetirizine (XYZAL) 5 MG tablet Take 1 tablet (5 mg total) by mouth every evening.   losartan-hydrochlorothiazide (HYZAAR) 100-25 MG tablet Take 1 tablet by mouth daily.   montelukast (SINGULAIR) 10 MG tablet Take 1 tablet (10 mg total) by mouth at bedtime.   Multiple Vitamins-Minerals (PRESERVISION AREDS 2) CAPS Take 1 capsule by mouth daily.    polyvinyl alcohol (LIQUIFILM TEARS) 1.4 % ophthalmic solution Place 1 drop into both eyes 2  (two) times daily.   predniSONE (DELTASONE) 10 MG tablet 4 tabs for 2 days, then 3 tabs for 2 days, 2 tabs for 2 days, then 1 tab for 2 days, then stop (Patient not taking: Reported on 06/15/2022)   rosuvastatin (CRESTOR) 10 MG tablet Take 10 mg by mouth at bedtime.   sodium chloride (OCEAN) 0.65 % SOLN nasal spray Place 1 spray into both nostrils daily.   sodium chloride HYPERTONIC 3 % nebulizer solution Take by nebulization in the morning and at bedtime. Dx: J47.9   Spacer/Aero-Holding Chambers (AEROCHAMBER MV) inhaler Use as instructed   tamsulosin (FLOMAX) 0.4 MG CAPS capsule Take 0.4 mg by mouth daily.   Facility-Administered Encounter Medications as of 08/02/2022  Medication   0.9 %  sodium chloride infusion   albuterol (PROVENTIL) (2.5 MG/3ML) 0.083% nebulizer solution 2.5 mg   methylPREDNISolone acetate (DEPO-MEDROL) injection 80 mg     Immunization History  Administered Date(s) Administered   Fluad Quad(high Dose 65+) 12/02/2018, 12/24/2019   Influenza Split 04/13/2011, 03/20/2012, 04/18/2012   Influenza, Quadrivalent, Recombinant, Inj, Pf 12/24/2019, 04/07/2021   Influenza,inj,Quad PF,6+ Mos 01/14/2017   Influenza,inj,quad, With Preservative 12/11/2018   PFIZER(Purple Top)SARS-COV-2 Vaccination 05/15/2019, 06/10/2019   Pneumococcal Conjugate-13 02/28/2013, 04/21/2013, 05/06/2015   Pneumococcal Polysaccharide-23 01/23/2007, 05/09/2016, 06/06/2022   Td 03/21/2004, 08/25/2013, 08/25/2013   Td (Adult) 08/25/2013   Unspecified SARS-COV-2 Vaccination 05/21/2019     PFTs    Latest Ref Rng & Units 02/18/2021   11:18 AM 03/15/2020   11:59 AM 01/21/2019    1:47 PM 10/08/2017   11:44 AM  PFT Results  FVC-Pre  L 3.43  3.44  3.73  4.03   FVC-Predicted Pre % 69  69  74  79   FVC-Post L 3.46  3.37  3.69  3.68   FVC-Predicted Post % 70  67  73  72   Pre FEV1/FVC % % 69  73  64  71   Post FEV1/FCV % % 69  73  70  72   FEV1-Pre L 2.37  2.51  2.40  2.85   FEV1-Predicted Pre % 66  69  65   77   FEV1-Post L 2.40  2.45  2.58  2.63   DLCO uncorrected ml/min/mmHg 24.33  28.29  25.55  28.61   DLCO UNC% % 85  98  88  73   DLCO corrected ml/min/mmHg 24.33  28.29     DLCO COR %Predicted % 85  98     DLVA Predicted % 98  108  108  83   TLC L 7.35  7.20  7.91  6.82   TLC % Predicted % 91  89  98  84   RV % Predicted % 117  104  139  91     Eosinophils Most recent blood eosinophil count was 500 cells/microL taken on 06/15/22.   IgE: 258 on 05/19/2021  Assessment   Biologics training for dupilumab (Dupixent)  Goals of therapy: Mechanism: human monoclonal IgG4 antibody that inhibits interleukin-4 and interleukin-13 cytokine-induced responses, including release of proinflammatory cytokines, chemokines, and IgE Reviewed that Dupixent is add-on medication and patient must continue maintenance inhaler regimen. Response to therapy: may take 4 months to determine efficacy. Discussed that patients generally feel improvement sooner than 4 months.  Side effects: injection site reaction (6-18%), antibody development (5-16%), ophthalmic conjunctivitis (2-16%), transient blood eosinophilia (1-2%)  Dose: 600mg  at Week 0 (administered today in clinic) followed by 300mg  every 14 days thereafter  Administration/Storage:  Reviewed administration sites of thigh or abdomen (at least 2-3 inches away from abdomen). Reviewed the upper arm is only appropriate if caregiver is administering injection  Do not shake pen/syringe as this could lead to product foaming or precipitation. Do not use if solution is discolored or contains particulate matter or if window on prefilled pen is yellow (indicates pen has been used).  Reviewed storage of medication in refrigerator. Reviewed that Dupixent can be stored at room temperature in unopened carton for up to 14 days.  Access: We are providing patient with 4 month of gratuity fill using manufacturer sample. If responsive to Dupixent after 3-4 month trial period,  patient will have to pay through insurance. He does not qualify for patient assistance programs due to exceeding income criteria.  Patient self-administered Dupixent 300mg /79ml x 2 (total dose 600mg ) in {injsitedsg:28167} and {injsitedsg:28167} using sample Dupixent 300mg /64mL autoinjector pen NDC: *** Lot: *** Expiration: ***  Patient monitored for 30 minutes for adverse reaction.  Patient tolerated ***. Injection site checked and no ***.  Medication Reconciliation  A drug regimen assessment was performed, including review of allergies, interactions, disease-state management, dosing and immunization history. Medications were reviewed with the patient, including name, instructions, indication, goals of therapy, potential side effects, importance of adherence, and safe use.  Drug interaction(s): ***  Immunizations  Patient is indicated for the influenzae, pneumonia, and shingles vaccinations. Patient has received *** COVID19 vaccines.   PLAN Continue Dupixent 300mg  every 14 days.  Next dose is due *** and every 14 days thereafter. Patient provided with four sample boxes (8 pens total) Continue maintenance asthma regimen  of: ***  All questions encouraged and answered.  Instructed patient to reach out with any further questions or concerns.  Thank you for allowing pharmacy to participate in this patient's care.  This appointment required *** minutes of patient care (this includes precharting, chart review, review of results, face-to-face care, etc.).  Chesley Mires, PharmD, MPH, BCPS, CPP Clinical Pharmacist (Rheumatology and Pulmonology)

## 2022-08-02 ENCOUNTER — Ambulatory Visit: Payer: PPO | Admitting: Pharmacist

## 2022-08-02 ENCOUNTER — Telehealth: Payer: Self-pay | Admitting: Pharmacist

## 2022-08-02 DIAGNOSIS — M545 Low back pain, unspecified: Secondary | ICD-10-CM | POA: Diagnosis not present

## 2022-08-02 DIAGNOSIS — M9901 Segmental and somatic dysfunction of cervical region: Secondary | ICD-10-CM | POA: Diagnosis not present

## 2022-08-02 DIAGNOSIS — M546 Pain in thoracic spine: Secondary | ICD-10-CM | POA: Diagnosis not present

## 2022-08-02 DIAGNOSIS — M9903 Segmental and somatic dysfunction of lumbar region: Secondary | ICD-10-CM | POA: Diagnosis not present

## 2022-08-02 DIAGNOSIS — M9902 Segmental and somatic dysfunction of thoracic region: Secondary | ICD-10-CM | POA: Diagnosis not present

## 2022-08-02 DIAGNOSIS — J45991 Cough variant asthma: Secondary | ICD-10-CM

## 2022-08-02 DIAGNOSIS — J4489 Other specified chronic obstructive pulmonary disease: Secondary | ICD-10-CM

## 2022-08-02 DIAGNOSIS — M62830 Muscle spasm of back: Secondary | ICD-10-CM | POA: Diagnosis not present

## 2022-08-02 DIAGNOSIS — M542 Cervicalgia: Secondary | ICD-10-CM | POA: Diagnosis not present

## 2022-08-02 DIAGNOSIS — Z7189 Other specified counseling: Secondary | ICD-10-CM

## 2022-08-02 DIAGNOSIS — J479 Bronchiectasis, uncomplicated: Secondary | ICD-10-CM | POA: Diagnosis not present

## 2022-08-02 DIAGNOSIS — M62838 Other muscle spasm: Secondary | ICD-10-CM | POA: Diagnosis not present

## 2022-08-02 MED ORDER — DUPIXENT 300 MG/2ML ~~LOC~~ SOAJ: SUBCUTANEOUS | 0 refills | Status: AC

## 2022-08-02 NOTE — Patient Instructions (Signed)
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 We will see how this works for you. Your next DUPIXENT dose is due on 08/16/22, 08/30/22, and every 14 days thereafter  CONTINUE Breztri 160-9-4.67mcg (2 puffs twice daily), montelukast 10mg  nightly  You will need to be seen by your provider in 3 to 4 months to assess how DUPIXENT is working for you. Please ensure you have a follow-up appointment scheduled in August or September 2024. Call our clinic if you need to make this appointment.  We need to be able to move forward with the authorization through insurance if Dupixent is helping you.  How to manage an injection site reaction: Remember the 5 C's: COUNTER - leave on the counter at least 30 minutes but up to overnight to bring medication to room temperature. This may help prevent stinging COLD - place something cold (like an ice gel pack or cold water bottle) on the injection site just before cleansing with alcohol. This may help reduce pain CLARITIN - use Claritin (generic name is loratadine) for the first two weeks of treatment or the day of, the day before, and the day after injecting. This will help to minimize injection site reactions CORTISONE CREAM - apply if injection site is irritated and itching CALL ME - if injection site reaction is bigger than the size of your fist, looks infected, blisters, or if you develop hives

## 2022-08-02 NOTE — Telephone Encounter (Signed)
Four boxes (2 pens each) of Dupixent provided to patient toady at new start visit. Loading dose of 600mg  as two divided injections compelted toady (08/02/22)  Medication: Dupixent 300mg /52mL autoinjector pen  NDC: [address]-78 Lot: [id number] Exp: 02/17/2024  Directions: Inject 300mg  into the skin every 14 days  Chesley Mires, PharmD, MPH, BCPS, CPP Clinical Pharmacist (Rheumatology and Pulmonology)

## 2022-08-03 DIAGNOSIS — M62830 Muscle spasm of back: Secondary | ICD-10-CM | POA: Diagnosis not present

## 2022-08-03 DIAGNOSIS — M9902 Segmental and somatic dysfunction of thoracic region: Secondary | ICD-10-CM | POA: Diagnosis not present

## 2022-08-03 DIAGNOSIS — M9903 Segmental and somatic dysfunction of lumbar region: Secondary | ICD-10-CM | POA: Diagnosis not present

## 2022-08-03 DIAGNOSIS — M9901 Segmental and somatic dysfunction of cervical region: Secondary | ICD-10-CM | POA: Diagnosis not present

## 2022-08-03 DIAGNOSIS — M62838 Other muscle spasm: Secondary | ICD-10-CM | POA: Diagnosis not present

## 2022-08-03 DIAGNOSIS — M542 Cervicalgia: Secondary | ICD-10-CM | POA: Diagnosis not present

## 2022-08-03 DIAGNOSIS — M545 Low back pain, unspecified: Secondary | ICD-10-CM | POA: Diagnosis not present

## 2022-08-03 DIAGNOSIS — M546 Pain in thoracic spine: Secondary | ICD-10-CM | POA: Diagnosis not present

## 2022-08-08 DIAGNOSIS — M542 Cervicalgia: Secondary | ICD-10-CM | POA: Diagnosis not present

## 2022-08-08 DIAGNOSIS — M9902 Segmental and somatic dysfunction of thoracic region: Secondary | ICD-10-CM | POA: Diagnosis not present

## 2022-08-08 DIAGNOSIS — M62838 Other muscle spasm: Secondary | ICD-10-CM | POA: Diagnosis not present

## 2022-08-08 DIAGNOSIS — M9903 Segmental and somatic dysfunction of lumbar region: Secondary | ICD-10-CM | POA: Diagnosis not present

## 2022-08-08 DIAGNOSIS — M62830 Muscle spasm of back: Secondary | ICD-10-CM | POA: Diagnosis not present

## 2022-08-08 DIAGNOSIS — M546 Pain in thoracic spine: Secondary | ICD-10-CM | POA: Diagnosis not present

## 2022-08-08 DIAGNOSIS — M545 Low back pain, unspecified: Secondary | ICD-10-CM | POA: Diagnosis not present

## 2022-08-08 DIAGNOSIS — M9901 Segmental and somatic dysfunction of cervical region: Secondary | ICD-10-CM | POA: Diagnosis not present

## 2022-08-09 DIAGNOSIS — M542 Cervicalgia: Secondary | ICD-10-CM | POA: Diagnosis not present

## 2022-08-09 DIAGNOSIS — M62830 Muscle spasm of back: Secondary | ICD-10-CM | POA: Diagnosis not present

## 2022-08-09 DIAGNOSIS — M9903 Segmental and somatic dysfunction of lumbar region: Secondary | ICD-10-CM | POA: Diagnosis not present

## 2022-08-09 DIAGNOSIS — M9902 Segmental and somatic dysfunction of thoracic region: Secondary | ICD-10-CM | POA: Diagnosis not present

## 2022-08-09 DIAGNOSIS — M9901 Segmental and somatic dysfunction of cervical region: Secondary | ICD-10-CM | POA: Diagnosis not present

## 2022-08-09 DIAGNOSIS — M545 Low back pain, unspecified: Secondary | ICD-10-CM | POA: Diagnosis not present

## 2022-08-09 DIAGNOSIS — M546 Pain in thoracic spine: Secondary | ICD-10-CM | POA: Diagnosis not present

## 2022-08-09 DIAGNOSIS — M62838 Other muscle spasm: Secondary | ICD-10-CM | POA: Diagnosis not present

## 2022-08-17 NOTE — Progress Notes (Signed)
[facility] Summary Report / Loop Recorder 

## 2022-08-21 ENCOUNTER — Encounter: Payer: Self-pay | Admitting: Nurse Practitioner

## 2022-08-21 ENCOUNTER — Ambulatory Visit (INDEPENDENT_AMBULATORY_CARE_PROVIDER_SITE_OTHER): Payer: PPO

## 2022-08-21 DIAGNOSIS — I482 Chronic atrial fibrillation, unspecified: Secondary | ICD-10-CM

## 2022-08-21 LAB — CUP PACEART REMOTE DEVICE CHECK
Date Time Interrogation Session: 20240602230221
Implantable Pulse Generator Implant Date: 20211122

## 2022-09-02 DIAGNOSIS — J479 Bronchiectasis, uncomplicated: Secondary | ICD-10-CM | POA: Diagnosis not present

## 2022-09-12 NOTE — Progress Notes (Signed)
[facility] Summary Report / Loop Recorder 

## 2022-09-14 DIAGNOSIS — H903 Sensorineural hearing loss, bilateral: Secondary | ICD-10-CM | POA: Diagnosis not present

## 2022-09-14 DIAGNOSIS — H6122 Impacted cerumen, left ear: Secondary | ICD-10-CM | POA: Diagnosis not present

## 2022-09-25 ENCOUNTER — Ambulatory Visit (INDEPENDENT_AMBULATORY_CARE_PROVIDER_SITE_OTHER): Payer: PPO

## 2022-09-25 DIAGNOSIS — I482 Chronic atrial fibrillation, unspecified: Secondary | ICD-10-CM | POA: Diagnosis not present

## 2022-09-25 LAB — CUP PACEART REMOTE DEVICE CHECK
Date Time Interrogation Session: 20240705230256
Implantable Pulse Generator Implant Date: 20211122

## 2022-09-27 ENCOUNTER — Other Ambulatory Visit: Payer: Self-pay | Admitting: Nurse Practitioner

## 2022-09-27 DIAGNOSIS — J3089 Other allergic rhinitis: Secondary | ICD-10-CM

## 2022-09-27 DIAGNOSIS — J449 Chronic obstructive pulmonary disease, unspecified: Secondary | ICD-10-CM

## 2022-09-27 DIAGNOSIS — D721 Eosinophilia, unspecified: Secondary | ICD-10-CM

## 2022-09-29 ENCOUNTER — Ambulatory Visit: Payer: PPO | Admitting: Nurse Practitioner

## 2022-10-02 DIAGNOSIS — J479 Bronchiectasis, uncomplicated: Secondary | ICD-10-CM | POA: Diagnosis not present

## 2022-10-11 NOTE — Progress Notes (Signed)
[facility] Summary Report / Loop Recorder 

## 2022-10-19 DIAGNOSIS — R490 Dysphonia: Secondary | ICD-10-CM | POA: Diagnosis not present

## 2022-10-25 ENCOUNTER — Ambulatory Visit (INDEPENDENT_AMBULATORY_CARE_PROVIDER_SITE_OTHER): Payer: PPO

## 2022-10-25 DIAGNOSIS — I483 Typical atrial flutter: Secondary | ICD-10-CM

## 2022-11-02 DIAGNOSIS — J479 Bronchiectasis, uncomplicated: Secondary | ICD-10-CM | POA: Diagnosis not present

## 2022-11-10 NOTE — Progress Notes (Signed)
[facility] Summary Report / Loop Recorder 

## 2022-11-22 DIAGNOSIS — H524 Presbyopia: Secondary | ICD-10-CM | POA: Diagnosis not present

## 2022-11-22 DIAGNOSIS — H353131 Nonexudative age-related macular degeneration, bilateral, early dry stage: Secondary | ICD-10-CM | POA: Diagnosis not present

## 2022-11-22 DIAGNOSIS — H43813 Vitreous degeneration, bilateral: Secondary | ICD-10-CM | POA: Diagnosis not present

## 2022-11-22 DIAGNOSIS — Z961 Presence of intraocular lens: Secondary | ICD-10-CM | POA: Diagnosis not present

## 2022-11-22 DIAGNOSIS — D3131 Benign neoplasm of right choroid: Secondary | ICD-10-CM | POA: Diagnosis not present

## 2022-11-27 ENCOUNTER — Ambulatory Visit (INDEPENDENT_AMBULATORY_CARE_PROVIDER_SITE_OTHER): Payer: PPO

## 2022-11-27 DIAGNOSIS — I483 Typical atrial flutter: Secondary | ICD-10-CM

## 2022-11-28 LAB — CUP PACEART REMOTE DEVICE CHECK
Date Time Interrogation Session: 20240909230353
Implantable Pulse Generator Implant Date: 20211122

## 2022-12-03 DIAGNOSIS — J479 Bronchiectasis, uncomplicated: Secondary | ICD-10-CM | POA: Diagnosis not present

## 2022-12-12 ENCOUNTER — Other Ambulatory Visit: Payer: Self-pay | Admitting: Internal Medicine

## 2022-12-13 NOTE — Progress Notes (Signed)
[facility] Summary Report / Loop Recorder 

## 2022-12-22 DIAGNOSIS — R600 Localized edema: Secondary | ICD-10-CM | POA: Diagnosis not present

## 2022-12-22 DIAGNOSIS — I4892 Unspecified atrial flutter: Secondary | ICD-10-CM | POA: Diagnosis not present

## 2022-12-22 DIAGNOSIS — I77810 Thoracic aortic ectasia: Secondary | ICD-10-CM | POA: Diagnosis not present

## 2022-12-22 DIAGNOSIS — Z23 Encounter for immunization: Secondary | ICD-10-CM | POA: Diagnosis not present

## 2022-12-22 DIAGNOSIS — J479 Bronchiectasis, uncomplicated: Secondary | ICD-10-CM | POA: Diagnosis not present

## 2022-12-22 DIAGNOSIS — D692 Other nonthrombocytopenic purpura: Secondary | ICD-10-CM | POA: Diagnosis not present

## 2022-12-22 DIAGNOSIS — J449 Chronic obstructive pulmonary disease, unspecified: Secondary | ICD-10-CM | POA: Diagnosis not present

## 2022-12-22 DIAGNOSIS — I700 Atherosclerosis of aorta: Secondary | ICD-10-CM | POA: Diagnosis not present

## 2022-12-22 DIAGNOSIS — I2721 Secondary pulmonary arterial hypertension: Secondary | ICD-10-CM | POA: Diagnosis not present

## 2022-12-22 DIAGNOSIS — R739 Hyperglycemia, unspecified: Secondary | ICD-10-CM | POA: Diagnosis not present

## 2022-12-22 DIAGNOSIS — I10 Essential (primary) hypertension: Secondary | ICD-10-CM | POA: Diagnosis not present

## 2022-12-22 DIAGNOSIS — Z0000 Encounter for general adult medical examination without abnormal findings: Secondary | ICD-10-CM | POA: Diagnosis not present

## 2022-12-22 DIAGNOSIS — Z1389 Encounter for screening for other disorder: Secondary | ICD-10-CM | POA: Diagnosis not present

## 2023-01-01 ENCOUNTER — Ambulatory Visit (INDEPENDENT_AMBULATORY_CARE_PROVIDER_SITE_OTHER): Payer: PPO

## 2023-01-01 DIAGNOSIS — I483 Typical atrial flutter: Secondary | ICD-10-CM | POA: Diagnosis not present

## 2023-01-02 DIAGNOSIS — J479 Bronchiectasis, uncomplicated: Secondary | ICD-10-CM | POA: Diagnosis not present

## 2023-01-02 LAB — CUP PACEART REMOTE DEVICE CHECK
Date Time Interrogation Session: 20241012230657
Implantable Pulse Generator Implant Date: 20211122

## 2023-01-15 DIAGNOSIS — H6123 Impacted cerumen, bilateral: Secondary | ICD-10-CM | POA: Diagnosis not present

## 2023-01-16 DIAGNOSIS — I712 Thoracic aortic aneurysm, without rupture, unspecified: Secondary | ICD-10-CM | POA: Diagnosis not present

## 2023-01-16 NOTE — Progress Notes (Signed)
[facility] Summary Report / Loop Recorder 

## 2023-01-17 ENCOUNTER — Other Ambulatory Visit (HOSPITAL_COMMUNITY): Payer: Self-pay | Admitting: Orthopedic Surgery

## 2023-01-17 DIAGNOSIS — Z96611 Presence of right artificial shoulder joint: Secondary | ICD-10-CM

## 2023-01-25 ENCOUNTER — Ambulatory Visit (HOSPITAL_COMMUNITY)
Admission: RE | Admit: 2023-01-25 | Discharge: 2023-01-25 | Disposition: A | Discharge status: Home / Self Care | Payer: PPO | Source: Ambulatory Visit | Attending: Orthopedic Surgery | Admitting: Orthopedic Surgery

## 2023-01-25 DIAGNOSIS — Z96611 Presence of right artificial shoulder joint: Secondary | ICD-10-CM | POA: Insufficient documentation

## 2023-01-25 MED ORDER — TECHNETIUM TC 99M MEDRONATE IV KIT
20.0000 | PACK | Freq: Once | INTRAVENOUS | Status: AC | PRN
  Administered 2023-01-25: 21.5 via INTRAVENOUS

## 2023-02-02 DIAGNOSIS — J479 Bronchiectasis, uncomplicated: Secondary | ICD-10-CM | POA: Diagnosis not present

## 2023-02-05 ENCOUNTER — Ambulatory Visit (INDEPENDENT_AMBULATORY_CARE_PROVIDER_SITE_OTHER): Payer: PPO

## 2023-02-05 DIAGNOSIS — I483 Typical atrial flutter: Secondary | ICD-10-CM

## 2023-02-05 LAB — CUP PACEART REMOTE DEVICE CHECK
Date Time Interrogation Session: 20241117230657
Implantable Pulse Generator Implant Date: 20211122

## 2023-03-01 NOTE — Progress Notes (Signed)
[facility] Summary Report / Loop Recorder 

## 2023-03-02 DIAGNOSIS — R972 Elevated prostate specific antigen [PSA]: Secondary | ICD-10-CM | POA: Diagnosis not present

## 2023-03-04 DIAGNOSIS — J479 Bronchiectasis, uncomplicated: Secondary | ICD-10-CM | POA: Diagnosis not present

## 2023-03-09 DIAGNOSIS — N400 Enlarged prostate without lower urinary tract symptoms: Secondary | ICD-10-CM | POA: Diagnosis not present

## 2023-03-09 DIAGNOSIS — R972 Elevated prostate specific antigen [PSA]: Secondary | ICD-10-CM | POA: Diagnosis not present

## 2023-03-12 ENCOUNTER — Ambulatory Visit: Payer: PPO

## 2023-03-12 DIAGNOSIS — I483 Typical atrial flutter: Secondary | ICD-10-CM | POA: Diagnosis not present

## 2023-03-12 LAB — CUP PACEART REMOTE DEVICE CHECK
Date Time Interrogation Session: 20241222230437
Implantable Pulse Generator Implant Date: 20211122

## 2023-03-20 ENCOUNTER — Other Ambulatory Visit: Payer: Self-pay | Admitting: Nurse Practitioner

## 2023-03-20 DIAGNOSIS — J3089 Other allergic rhinitis: Secondary | ICD-10-CM

## 2023-03-20 DIAGNOSIS — J449 Chronic obstructive pulmonary disease, unspecified: Secondary | ICD-10-CM

## 2023-03-25 ENCOUNTER — Other Ambulatory Visit: Payer: Self-pay | Admitting: Nurse Practitioner

## 2023-03-25 DIAGNOSIS — J449 Chronic obstructive pulmonary disease, unspecified: Secondary | ICD-10-CM

## 2023-03-25 DIAGNOSIS — J3089 Other allergic rhinitis: Secondary | ICD-10-CM

## 2023-03-25 DIAGNOSIS — D721 Eosinophilia, unspecified: Secondary | ICD-10-CM

## 2023-04-04 DIAGNOSIS — J479 Bronchiectasis, uncomplicated: Secondary | ICD-10-CM | POA: Diagnosis not present

## 2023-04-16 ENCOUNTER — Ambulatory Visit: Payer: PPO

## 2023-04-16 DIAGNOSIS — I483 Typical atrial flutter: Secondary | ICD-10-CM

## 2023-04-16 LAB — CUP PACEART REMOTE DEVICE CHECK
Date Time Interrogation Session: 20250126230605
Implantable Pulse Generator Implant Date: 20211122

## 2023-04-17 ENCOUNTER — Encounter: Payer: Self-pay | Admitting: Cardiology

## 2023-04-19 NOTE — Progress Notes (Signed)
[facility] Summary Report / Loop Recorder

## 2023-05-05 DIAGNOSIS — J479 Bronchiectasis, uncomplicated: Secondary | ICD-10-CM | POA: Diagnosis not present

## 2023-05-16 DIAGNOSIS — M720 Palmar fascial fibromatosis [Dupuytren]: Secondary | ICD-10-CM | POA: Diagnosis not present

## 2023-05-16 DIAGNOSIS — G5603 Carpal tunnel syndrome, bilateral upper limbs: Secondary | ICD-10-CM | POA: Diagnosis not present

## 2023-05-16 DIAGNOSIS — M79642 Pain in left hand: Secondary | ICD-10-CM | POA: Diagnosis not present

## 2023-05-17 DIAGNOSIS — M25511 Pain in right shoulder: Secondary | ICD-10-CM | POA: Diagnosis not present

## 2023-05-21 ENCOUNTER — Ambulatory Visit (INDEPENDENT_AMBULATORY_CARE_PROVIDER_SITE_OTHER): Payer: PPO

## 2023-05-21 DIAGNOSIS — I483 Typical atrial flutter: Secondary | ICD-10-CM

## 2023-05-22 LAB — CUP PACEART REMOTE DEVICE CHECK
Date Time Interrogation Session: 20250302230437
Implantable Pulse Generator Implant Date: 20211122

## 2023-05-24 NOTE — Progress Notes (Signed)
 Carelink Summary Report / Loop Recorder

## 2023-05-31 ENCOUNTER — Encounter: Payer: Self-pay | Admitting: Cardiology

## 2023-06-02 DIAGNOSIS — J479 Bronchiectasis, uncomplicated: Secondary | ICD-10-CM | POA: Diagnosis not present

## 2023-06-12 DIAGNOSIS — N401 Enlarged prostate with lower urinary tract symptoms: Secondary | ICD-10-CM | POA: Diagnosis not present

## 2023-06-12 DIAGNOSIS — R739 Hyperglycemia, unspecified: Secondary | ICD-10-CM | POA: Diagnosis not present

## 2023-06-12 DIAGNOSIS — I10 Essential (primary) hypertension: Secondary | ICD-10-CM | POA: Diagnosis not present

## 2023-06-12 DIAGNOSIS — E785 Hyperlipidemia, unspecified: Secondary | ICD-10-CM | POA: Diagnosis not present

## 2023-06-15 DIAGNOSIS — I700 Atherosclerosis of aorta: Secondary | ICD-10-CM | POA: Diagnosis not present

## 2023-06-15 DIAGNOSIS — I10 Essential (primary) hypertension: Secondary | ICD-10-CM | POA: Diagnosis not present

## 2023-06-15 DIAGNOSIS — I4892 Unspecified atrial flutter: Secondary | ICD-10-CM | POA: Diagnosis not present

## 2023-06-15 DIAGNOSIS — G609 Hereditary and idiopathic neuropathy, unspecified: Secondary | ICD-10-CM | POA: Diagnosis not present

## 2023-06-15 DIAGNOSIS — R82998 Other abnormal findings in urine: Secondary | ICD-10-CM | POA: Diagnosis not present

## 2023-06-15 DIAGNOSIS — I4510 Unspecified right bundle-branch block: Secondary | ICD-10-CM | POA: Diagnosis not present

## 2023-06-15 DIAGNOSIS — Z0000 Encounter for general adult medical examination without abnormal findings: Secondary | ICD-10-CM | POA: Diagnosis not present

## 2023-06-15 DIAGNOSIS — I712 Thoracic aortic aneurysm, without rupture, unspecified: Secondary | ICD-10-CM | POA: Diagnosis not present

## 2023-06-15 DIAGNOSIS — Z1331 Encounter for screening for depression: Secondary | ICD-10-CM | POA: Diagnosis not present

## 2023-06-15 DIAGNOSIS — M25511 Pain in right shoulder: Secondary | ICD-10-CM | POA: Diagnosis not present

## 2023-06-15 DIAGNOSIS — Z1389 Encounter for screening for other disorder: Secondary | ICD-10-CM | POA: Diagnosis not present

## 2023-06-15 DIAGNOSIS — G4734 Idiopathic sleep related nonobstructive alveolar hypoventilation: Secondary | ICD-10-CM | POA: Diagnosis not present

## 2023-06-15 DIAGNOSIS — M159 Polyosteoarthritis, unspecified: Secondary | ICD-10-CM | POA: Diagnosis not present

## 2023-06-15 DIAGNOSIS — J9611 Chronic respiratory failure with hypoxia: Secondary | ICD-10-CM | POA: Diagnosis not present

## 2023-06-15 DIAGNOSIS — J449 Chronic obstructive pulmonary disease, unspecified: Secondary | ICD-10-CM | POA: Diagnosis not present

## 2023-06-21 DIAGNOSIS — G5603 Carpal tunnel syndrome, bilateral upper limbs: Secondary | ICD-10-CM | POA: Diagnosis not present

## 2023-06-21 NOTE — Addendum Note (Signed)
 Addended by: Geralyn Flash D on: 06/21/2023 01:36 PM   Modules accepted: Orders

## 2023-06-21 NOTE — Progress Notes (Signed)
 Carelink Summary Report / Loop Recorder

## 2023-06-25 ENCOUNTER — Ambulatory Visit (INDEPENDENT_AMBULATORY_CARE_PROVIDER_SITE_OTHER): Payer: PPO

## 2023-06-25 DIAGNOSIS — I483 Typical atrial flutter: Secondary | ICD-10-CM | POA: Diagnosis not present

## 2023-06-26 LAB — CUP PACEART REMOTE DEVICE CHECK
Date Time Interrogation Session: 20250406230258
Implantable Pulse Generator Implant Date: 20211122

## 2023-06-28 DIAGNOSIS — M720 Palmar fascial fibromatosis [Dupuytren]: Secondary | ICD-10-CM | POA: Diagnosis not present

## 2023-06-28 DIAGNOSIS — G5602 Carpal tunnel syndrome, left upper limb: Secondary | ICD-10-CM | POA: Diagnosis not present

## 2023-06-30 ENCOUNTER — Encounter: Payer: Self-pay | Admitting: Cardiology

## 2023-07-03 DIAGNOSIS — J479 Bronchiectasis, uncomplicated: Secondary | ICD-10-CM | POA: Diagnosis not present

## 2023-07-09 ENCOUNTER — Other Ambulatory Visit: Payer: Self-pay | Admitting: Internal Medicine

## 2023-07-13 DIAGNOSIS — G5602 Carpal tunnel syndrome, left upper limb: Secondary | ICD-10-CM | POA: Diagnosis not present

## 2023-07-20 ENCOUNTER — Other Ambulatory Visit: Payer: Self-pay | Admitting: Nurse Practitioner

## 2023-07-20 DIAGNOSIS — J3089 Other allergic rhinitis: Secondary | ICD-10-CM

## 2023-07-20 DIAGNOSIS — J449 Chronic obstructive pulmonary disease, unspecified: Secondary | ICD-10-CM

## 2023-07-30 ENCOUNTER — Ambulatory Visit: Payer: PPO

## 2023-07-30 DIAGNOSIS — I483 Typical atrial flutter: Secondary | ICD-10-CM

## 2023-07-30 LAB — CUP PACEART REMOTE DEVICE CHECK
Date Time Interrogation Session: 20250511233040
Implantable Pulse Generator Implant Date: 20211122

## 2023-08-05 ENCOUNTER — Ambulatory Visit: Payer: Self-pay | Admitting: Cardiology

## 2023-08-06 ENCOUNTER — Other Ambulatory Visit: Payer: Self-pay | Admitting: Nurse Practitioner

## 2023-08-06 DIAGNOSIS — J449 Chronic obstructive pulmonary disease, unspecified: Secondary | ICD-10-CM

## 2023-08-06 DIAGNOSIS — D721 Eosinophilia, unspecified: Secondary | ICD-10-CM

## 2023-08-06 DIAGNOSIS — J3089 Other allergic rhinitis: Secondary | ICD-10-CM

## 2023-08-07 NOTE — Addendum Note (Signed)
 Addended by: Edra Govern D on: 08/07/2023 03:59 PM   Modules accepted: Orders

## 2023-08-07 NOTE — Progress Notes (Signed)
 Carelink Summary Report / Loop Recorder

## 2023-08-09 DIAGNOSIS — M25642 Stiffness of left hand, not elsewhere classified: Secondary | ICD-10-CM | POA: Diagnosis not present

## 2023-08-16 ENCOUNTER — Other Ambulatory Visit: Payer: Self-pay | Admitting: Internal Medicine

## 2023-08-30 ENCOUNTER — Ambulatory Visit (INDEPENDENT_AMBULATORY_CARE_PROVIDER_SITE_OTHER)

## 2023-08-30 DIAGNOSIS — I483 Typical atrial flutter: Secondary | ICD-10-CM | POA: Diagnosis not present

## 2023-08-30 LAB — CUP PACEART REMOTE DEVICE CHECK
Date Time Interrogation Session: 20250611230728
Implantable Pulse Generator Implant Date: 20211122

## 2023-09-01 ENCOUNTER — Ambulatory Visit: Payer: Self-pay | Admitting: Cardiology

## 2023-09-07 ENCOUNTER — Other Ambulatory Visit: Payer: Self-pay | Admitting: Nurse Practitioner

## 2023-09-07 DIAGNOSIS — J3089 Other allergic rhinitis: Secondary | ICD-10-CM

## 2023-09-07 DIAGNOSIS — D721 Eosinophilia, unspecified: Secondary | ICD-10-CM

## 2023-09-07 DIAGNOSIS — J449 Chronic obstructive pulmonary disease, unspecified: Secondary | ICD-10-CM

## 2023-09-08 ENCOUNTER — Other Ambulatory Visit: Payer: Self-pay | Admitting: Nurse Practitioner

## 2023-09-08 DIAGNOSIS — J3089 Other allergic rhinitis: Secondary | ICD-10-CM

## 2023-09-08 DIAGNOSIS — D721 Eosinophilia, unspecified: Secondary | ICD-10-CM

## 2023-09-08 DIAGNOSIS — J449 Chronic obstructive pulmonary disease, unspecified: Secondary | ICD-10-CM

## 2023-09-13 ENCOUNTER — Other Ambulatory Visit: Payer: Self-pay | Admitting: Nurse Practitioner

## 2023-09-13 DIAGNOSIS — J3089 Other allergic rhinitis: Secondary | ICD-10-CM

## 2023-09-13 DIAGNOSIS — J449 Chronic obstructive pulmonary disease, unspecified: Secondary | ICD-10-CM

## 2023-09-13 DIAGNOSIS — D721 Eosinophilia, unspecified: Secondary | ICD-10-CM

## 2023-09-13 DIAGNOSIS — M25531 Pain in right wrist: Secondary | ICD-10-CM | POA: Diagnosis not present

## 2023-09-13 NOTE — Progress Notes (Signed)
 Carelink Summary Report / Loop Recorder

## 2023-09-18 ENCOUNTER — Other Ambulatory Visit: Payer: Self-pay | Admitting: Nurse Practitioner

## 2023-09-18 DIAGNOSIS — J449 Chronic obstructive pulmonary disease, unspecified: Secondary | ICD-10-CM

## 2023-09-18 DIAGNOSIS — J3089 Other allergic rhinitis: Secondary | ICD-10-CM

## 2023-09-25 DIAGNOSIS — H903 Sensorineural hearing loss, bilateral: Secondary | ICD-10-CM | POA: Diagnosis not present

## 2023-09-25 DIAGNOSIS — H6123 Impacted cerumen, bilateral: Secondary | ICD-10-CM | POA: Diagnosis not present

## 2023-10-01 ENCOUNTER — Ambulatory Visit: Payer: Self-pay | Admitting: Cardiology

## 2023-10-01 ENCOUNTER — Ambulatory Visit

## 2023-10-01 DIAGNOSIS — I483 Typical atrial flutter: Secondary | ICD-10-CM

## 2023-10-01 LAB — CUP PACEART REMOTE DEVICE CHECK
Date Time Interrogation Session: 20250713231401
Implantable Pulse Generator Implant Date: 20211122

## 2023-10-04 DIAGNOSIS — M25431 Effusion, right wrist: Secondary | ICD-10-CM | POA: Diagnosis not present

## 2023-10-14 ENCOUNTER — Other Ambulatory Visit: Payer: Self-pay | Admitting: Internal Medicine

## 2023-10-22 NOTE — Progress Notes (Signed)
 Carelink Summary Report / Loop Recorder

## 2023-11-01 ENCOUNTER — Ambulatory Visit (INDEPENDENT_AMBULATORY_CARE_PROVIDER_SITE_OTHER)

## 2023-11-01 DIAGNOSIS — I483 Typical atrial flutter: Secondary | ICD-10-CM

## 2023-11-01 LAB — CUP PACEART REMOTE DEVICE CHECK
Date Time Interrogation Session: 20250813230831
Implantable Pulse Generator Implant Date: 20211122

## 2023-11-02 ENCOUNTER — Ambulatory Visit: Payer: Self-pay | Admitting: Cardiology

## 2023-11-20 ENCOUNTER — Other Ambulatory Visit: Payer: Self-pay | Admitting: Internal Medicine

## 2023-11-27 DIAGNOSIS — Z961 Presence of intraocular lens: Secondary | ICD-10-CM | POA: Diagnosis not present

## 2023-11-27 DIAGNOSIS — H43813 Vitreous degeneration, bilateral: Secondary | ICD-10-CM | POA: Diagnosis not present

## 2023-11-27 DIAGNOSIS — H524 Presbyopia: Secondary | ICD-10-CM | POA: Diagnosis not present

## 2023-11-27 DIAGNOSIS — H353131 Nonexudative age-related macular degeneration, bilateral, early dry stage: Secondary | ICD-10-CM | POA: Diagnosis not present

## 2023-11-28 DIAGNOSIS — M79642 Pain in left hand: Secondary | ICD-10-CM | POA: Diagnosis not present

## 2023-12-03 ENCOUNTER — Ambulatory Visit (INDEPENDENT_AMBULATORY_CARE_PROVIDER_SITE_OTHER)

## 2023-12-03 DIAGNOSIS — I483 Typical atrial flutter: Secondary | ICD-10-CM

## 2023-12-04 ENCOUNTER — Other Ambulatory Visit: Payer: Self-pay | Admitting: Internal Medicine

## 2023-12-04 LAB — CUP PACEART REMOTE DEVICE CHECK
Date Time Interrogation Session: 20250914231827
Implantable Pulse Generator Implant Date: 20211122

## 2023-12-05 ENCOUNTER — Other Ambulatory Visit: Payer: Self-pay | Admitting: Nurse Practitioner

## 2023-12-05 MED ORDER — BREZTRI AEROSPHERE 160-9-4.8 MCG/ACT IN AERO: 2.0000 | INHALATION_SPRAY | Freq: Two times a day (BID) | RESPIRATORY_TRACT | 0 refills | Status: DC

## 2023-12-05 NOTE — Telephone Encounter (Signed)
 Copied from CRM #[id number]. Topic: Clinical - Medication Refill >> Dec 05, 2023 10:15 AM Rilla B wrote: Medication:  BREZTRI  AEROSPHERE 160-9-4.8 MCG/ACT AERO inhaler   Has the patient contacted their pharmacy? Yes (Agent: If no, request that the patient contact the pharmacy for the refill. If patient does not wish to contact the pharmacy document the reason why and proceed with request.) (Agent: If yes, when and what did the pharmacy advise?)  This is the patient's preferred pharmacy:  [id number] [place], [place] - [address] AT [facility] [address] & [facility] [address] [place] [address] Phone: [phone] Fax: [phone]  Is this the correct pharmacy for this prescription? Yes If no, delete pharmacy and type the correct one.   Has the prescription been filled recently? Yes  Is the patient out of the medication? No  Has the patient been seen for an appointment in the last year OR does the patient have an upcoming appointment? Yes  Can we respond through MyChart? No  Agent: Please be advised that Rx refills may take up to 3 business days. We ask that you follow-up with your pharmacy.

## 2023-12-05 NOTE — Telephone Encounter (Signed)
 Called pt regarding refill refusal for needing an appt. Pt reporting that he scheduled appt earlier this morning with Cobb NP for 11/12. Pt confirms he is not having new or worsening symptoms at this time. Routing refill request to pulm.

## 2023-12-06 ENCOUNTER — Ambulatory Visit: Payer: Self-pay | Admitting: Cardiology

## 2023-12-10 DIAGNOSIS — J479 Bronchiectasis, uncomplicated: Secondary | ICD-10-CM | POA: Diagnosis not present

## 2023-12-10 DIAGNOSIS — K219 Gastro-esophageal reflux disease without esophagitis: Secondary | ICD-10-CM | POA: Diagnosis not present

## 2023-12-10 DIAGNOSIS — I10 Essential (primary) hypertension: Secondary | ICD-10-CM | POA: Diagnosis not present

## 2023-12-10 DIAGNOSIS — R739 Hyperglycemia, unspecified: Secondary | ICD-10-CM | POA: Diagnosis not present

## 2023-12-10 DIAGNOSIS — I4892 Unspecified atrial flutter: Secondary | ICD-10-CM | POA: Diagnosis not present

## 2023-12-10 DIAGNOSIS — E785 Hyperlipidemia, unspecified: Secondary | ICD-10-CM | POA: Diagnosis not present

## 2023-12-10 DIAGNOSIS — I2721 Secondary pulmonary arterial hypertension: Secondary | ICD-10-CM | POA: Diagnosis not present

## 2023-12-10 DIAGNOSIS — G4733 Obstructive sleep apnea (adult) (pediatric): Secondary | ICD-10-CM | POA: Diagnosis not present

## 2023-12-10 DIAGNOSIS — Z23 Encounter for immunization: Secondary | ICD-10-CM | POA: Diagnosis not present

## 2023-12-10 DIAGNOSIS — D692 Other nonthrombocytopenic purpura: Secondary | ICD-10-CM | POA: Diagnosis not present

## 2023-12-10 DIAGNOSIS — J449 Chronic obstructive pulmonary disease, unspecified: Secondary | ICD-10-CM | POA: Diagnosis not present

## 2023-12-10 DIAGNOSIS — I77810 Thoracic aortic ectasia: Secondary | ICD-10-CM | POA: Diagnosis not present

## 2023-12-10 NOTE — Progress Notes (Signed)
 Remote Loop Recorder Transmission

## 2023-12-13 ENCOUNTER — Encounter

## 2023-12-13 NOTE — Progress Notes (Signed)
 Remote Loop Recorder Transmission

## 2023-12-26 NOTE — Progress Notes (Signed)
 Remote Loop Recorder Transmission

## 2023-12-27 ENCOUNTER — Encounter: Payer: Self-pay | Admitting: Nurse Practitioner

## 2023-12-27 ENCOUNTER — Ambulatory Visit: Admitting: Nurse Practitioner

## 2023-12-27 VITALS — BP 132/60 | HR 68 | Temp 97.7°F | Ht 75.0 in | Wt 276.4 lb

## 2023-12-27 DIAGNOSIS — J302 Other seasonal allergic rhinitis: Secondary | ICD-10-CM | POA: Diagnosis not present

## 2023-12-27 DIAGNOSIS — D721 Eosinophilia, unspecified: Secondary | ICD-10-CM

## 2023-12-27 DIAGNOSIS — J4489 Other specified chronic obstructive pulmonary disease: Secondary | ICD-10-CM | POA: Diagnosis not present

## 2023-12-27 DIAGNOSIS — J449 Chronic obstructive pulmonary disease, unspecified: Secondary | ICD-10-CM

## 2023-12-27 DIAGNOSIS — J3089 Other allergic rhinitis: Secondary | ICD-10-CM

## 2023-12-27 LAB — POCT EXHALED NITRIC OXIDE: FeNO level (ppb): 16

## 2023-12-27 MED ORDER — MONTELUKAST SODIUM 10 MG PO TABS
10.0000 mg | ORAL_TABLET | Freq: Every day | ORAL | 11 refills | Status: AC
Start: 2023-12-27 — End: ?

## 2023-12-27 MED ORDER — AZELASTINE HCL 0.1 % NA SOLN: 2.0000 | Freq: Two times a day (BID) | NASAL | 5 refills | Status: AC

## 2023-12-27 MED ORDER — FLUTICASONE PROPIONATE 50 MCG/ACT NA SUSP
2.0000 | Freq: Every day | NASAL | 5 refills | Status: AC
Start: 2023-12-27 — End: ?

## 2023-12-27 NOTE — Patient Instructions (Addendum)
-  Continue Aisupra 1-2 puffs every 4-6 hours or Albuterol  3 mL neb every 6 hours as needed for shortness of breath or wheezing. Notify if symptoms persist despite rescue inhaler/neb use. -Continue Breztri  2 puffs Twice daily. Brush tongue and rinse mouth well afterwards. Use with spacer.  -Continue on supplemental oxygen  2 lpm at night -Continue Mucinex  600 mg Twice daily as needed for chest congestion -Restart Astelin  nasal spray 2 sprays each nostril Twice daily as needed for sinus congestion or nasal drainage  -Restart flonase 2 spray each nostril daily -Restart saline nasal spray 1 spray each nostril 1-2 times a day  -Continue Xyzal  (levocetirizine) 10 mg daily for allergies in the morning -Restart Singulair  (montelukast ) 10 mg At bedtime   We will have you resume Dupixent , if we can get it covered by insurance/patient assistance. You completed the paperwork today and someone should reach out to you to get you restarted on this.    Follow up in 8 weeks with Dr. Geronimo or Izetta Malachy PIETY. If symptoms do not improve or worsen, please contact office for sooner follow up or seek emergency care

## 2023-12-27 NOTE — Progress Notes (Unsigned)
 @Patient  ID: Noah Huffman, male    DOB: 10-Apr-1940, 83 y.o.   MRN: [id number]  Chief Complaint  Patient presents with   COPD    Referring provider: Onita Rush, MD  HPI: 83 year old male, former smoker (62.5-pack-year history) followed for COPD Gold 3, chronic cough and OSA.  He is a patient of Dr. Reeves and also followed by Dr. Neysa for sleep.  Last seen in office on 06/30/2022.  Past medical history significant for history of asthma, hypertension, A-fib, GERD, OA, BPH, obesity, HLD.  TEST/EVENTS:  04/27/2013 and PSG: AHI 16.5/h, desaturation 87% 01/30/2020 HRCT chest: Atherosclerosis, CAD.  Mild diffuse bilateral bronchial wall thickening.  Mild band appearing scarring and volume loss of bases, left greater than right; similar in appearance when compared to previous. 09/03/2019 echo: On 2 L/min, showed sustained hypoxemia and bradycardia 12/24/2020 NPSG: AHI 9/h, desaturation 85% 02/18/2021 PFTs: FVC 3.46 (70), FEV1 2.4 (67, ratio 69, TLC 91%, DLCO uncorrected 85%.  Moderate obstructive airway disease with normal diffusion capacity.  No BD 11/01/2021 HRCT chest: atherosclerosis. Ascending aorta measures up to 4.2 cm. Enlarged pulmonic trunk. No ILD. Mild basilar scarring. Scattered mucoid impaction in the lower lobes. 3 mm nodule in the subpleural aspect of the superior segment RLL, unchanged. New 3 mm subpleural left upper lobe nodule, new. Mild air trapping.  04/05/2022: eos 800  06/30/2022: OV with Dr. Geronimo. Peripheral eosinophilia. No significant cough but sputum production significant that yellow in color. Last visit, gave him 3% saline nebulizer but has not gotten this yet. Wants to know if biologic therapy would help his hoarseness. Willing to start. Will complete paperwork for Fasenra. Referral to ENT for voice hoarseness.   12/27/2023: Today - follow up Discussed the use of AI scribe software for clinical note transcription with the patient, who gave verbal consent to  proceed.  History of Present Illness Noah Huffman is an 83 year old male with COPD who presents for overdue follow up.   He experiences variable breathing difficulties, with some days being worse than others. He was previously on Dupixent  but only did two injections as he states he did not receive follow-up after completing it. He is currently using Breztri  twice daily and has Airsupra  inhaler, although he has never used them. He does carry his rescue with him. He experiences wheezing often, and uses a nebulizer with either albuterol  when his congestion and phlegm become thick. No recent increase in chest congestion or sputum.   He has a persistent cough with phlegm that varies in color from clear to darker. No hemoptysis or fevers.  He has not been using his nasal spray regimen, including Flonase, which previously helped with his symptoms. He has not been taking montelukast  (Singulair ) but is taking levocetirizine (Xyzal ).  He describes a sensation of sinus congestion similar to when he was a child, with his head feeling 'all stopped up' all the time.   Lab Results  Component Value Date   NITRICOXIDE 22 08/24/2017   This result suggests low (<25) Type 2 (T2) airway inflammation indicating a low likelihood of active T2-driven airway inflammation; reduced probability of response to inhaled corticosteroids.      Allergies  Allergen Reactions   Macrobid [Nitrofurantoin]    Morphine And Codeine Rash    Immunization History  Administered Date(s) Administered   Fluad Quad(high Dose 65+) 12/02/2018, 12/24/2019   Influenza Split 04/13/2011, 03/20/2012, 04/18/2012   Influenza, Quadrivalent, Recombinant, Inj, Pf 12/24/2019, 04/07/2021   Influenza,inj,Quad PF,6+ Mos  01/14/2017   Influenza,inj,quad, With Preservative 12/11/2018   PFIZER(Purple Top)SARS-COV-2 Vaccination 05/15/2019, 06/10/2019   Pneumococcal Conjugate-13 02/28/2013, 04/21/2013, 05/06/2015   Pneumococcal  Polysaccharide-23 01/23/2007, 05/09/2016, 06/06/2022   Td 03/21/2004, 08/25/2013, 08/25/2013   Td (Adult) 08/25/2013   Unspecified SARS-COV-2 Vaccination 05/21/2019    Past Medical History:  Diagnosis Date   Allergy    seasonal   Asthma    Atrial fibrillation (HCC)    BPH (benign prostatic hypertrophy) with urinary obstruction    Cataract    removed both eyes   COPD (chronic obstructive pulmonary disease) (HCC)    Diverticulosis    Dysrhythmia    GERD (gastroesophageal reflux disease)    Hyperglycemia    Hyperlipidemia    Hypertension    Inguinal hernia    Neuromuscular disorder (HCC)    neuropathy in feet    Obesity    OSA (obstructive sleep apnea) 06/06/2013   Osteoarthritis    Sleep apnea    no cpap    Tobacco History: Social History   Tobacco Use  Smoking Status Former   Current packs/day: 0.00   Average packs/day: 2.5 packs/day for 25.0 years (62.5 ttl pk-yrs)   Types: Cigarettes   Start date: 03/21/1963   Quit date: 03/20/1988   Years since quitting: 35.7  Smokeless Tobacco Never   Counseling given: Not Answered   Outpatient Medications Prior to Visit  Medication Sig Dispense Refill   Albuterol -Budesonide  (AIRSUPRA ) 90-80 MCG/ACT AERO Inhale 1-2 puffs into the lungs every 4 (four) hours as needed (shortness of breath, wheezing, chest tightness). 10.7 g 5   aspirin  EC 81 MG tablet Take 1 tablet (81 mg total) by mouth daily. Swallow whole. 90 tablet 3   budesonide -glycopyrrolate-formoterol  (BREZTRI  AEROSPHERE) 160-9-4.8 MCG/ACT AERO inhaler Inhale 2 puffs into the lungs 2 (two) times daily. 10.7 g 0   Dupilumab  (DUPIXENT ) 300 MG/2ML SOPN Inject 600mg  into the skin at Day 0 and 300mg  every 14 days thereafter 20 mL 0   gabapentin (NEURONTIN) 600 MG tablet Take 1,800 mg by mouth 3 (three) times daily.     guaiFENesin  (MUCINEX ) 600 MG 12 hr tablet Take 1 tablet (600 mg total) by mouth 2 (two) times daily. 60 tablet 3   levocetirizine (XYZAL ) 5 MG tablet TAKE 1  TABLET(5 MG) BY MOUTH EVERY EVENING 90 tablet 1   losartan-hydrochlorothiazide (HYZAAR) 100-25 MG tablet Take 1 tablet by mouth daily.     Multiple Vitamins-Minerals (PRESERVISION AREDS 2) CAPS Take 1 capsule by mouth daily.      polyvinyl alcohol  (LIQUIFILM TEARS) 1.4 % ophthalmic solution Place 1 drop into both eyes 2 (two) times daily.     rosuvastatin (CRESTOR) 10 MG tablet Take 10 mg by mouth at bedtime.     sodium chloride  (OCEAN) 0.65 % SOLN nasal spray Place 1 spray into both nostrils daily.     sodium chloride  HYPERTONIC 3 % nebulizer solution Take by nebulization in the morning and at bedtime. Dx: J47.9 750 mL 12   Spacer/Aero-Holding Chambers (AEROCHAMBER MV) inhaler Use as instructed 1 each 0   tamsulosin (FLOMAX) 0.4 MG CAPS capsule Take 0.4 mg by mouth daily.     azelastine  (ASTELIN ) 0.1 % nasal spray USE 2 SPRAYS IN EACH NOSTRIL TWICE DAILY AS DIRECTED 30 mL 3   fluticasone (FLONASE) 50 MCG/ACT nasal spray Place 1 spray into both nostrils daily.     montelukast  (SINGULAIR ) 10 MG tablet TAKE 1 TABLET(10 MG) BY MOUTH AT BEDTIME 30 tablet 5   Facility-Administered Medications Prior  to Visit  Medication Dose Route Frequency Provider Last Rate Last Admin   0.9 %  sodium chloride  infusion  500 mL Intravenous Once Abran Norleen SAILOR, MD       albuterol  (PROVENTIL ) (2.5 MG/3ML) 0.083% nebulizer solution 2.5 mg  2.5 mg Nebulization Q6H PRN Mirta Mally V, NP       methylPREDNISolone  acetate (DEPO-MEDROL ) injection 80 mg  80 mg Intramuscular Once Sehar Sedano, Comer GAILS, NP         Review of Systems: as above    Physical Exam:  BP 132/60   Pulse 68   Temp 97.7 F (36.5 C)   Ht 6' 3 (1.905 m)   Wt 276 lb 6.4 oz (125.4 kg)   SpO2 96% Comment: RA  BMI 34.55 kg/m   GEN: Pleasant, interactive, well-appearing; obese; in no acute distress. HEENT:  Normocephalic and atraumatic. PERRLA.Sclera white. Nasal turbinates boggy, moist and patent bilaterally.  No rhinorrhea present. Oropharynx  pink and moist, without exudate or edema. No lesions, ulcerations.  NECK:  Supple w/ fair ROM. No JVD present.Thyroid  symmetrical with no goiter or nodules palpated. Cervical lymphadenopathy.   CV: RRR, no m/r/g, no peripheral edema. Pulses intact, +2 bilaterally. No cyanosis, pallor or clubbing. PULMONARY:  Unlabored, regular breathing. Diminished bilaterally A&P w/o wheezes/rales/rhonchi. No accessory muscle use. No dullness to percussion. GI: BS present and normoactive. Soft, non-tender to palpation. MSK: No erythema, warmth or tenderness. Cap refil <2 sec all extrem. Neuro: A/Ox3. No focal deficits noted.   Skin: Warm, no lesions or rashe Psych: Normal affect and behavior. Judgement and thought content appropriate.     Lab Results:  CBC    Component Value Date/Time   WBC 9.2 06/15/2022 1500   RBC 4.37 06/15/2022 1500   HGB 14.0 06/15/2022 1500   HGB 14.1 12/31/2019 1004   HCT 40.7 06/15/2022 1500   HCT 41.9 12/31/2019 1004   PLT 221.0 06/15/2022 1500   PLT 204 12/31/2019 1004   MCV 93.3 06/15/2022 1500   MCV 91 12/31/2019 1004   MCH 31.4 03/23/2020 1140   MCHC 34.3 06/15/2022 1500   RDW 14.6 06/15/2022 1500   RDW 14.6 12/31/2019 1004   LYMPHSABS 2.2 06/15/2022 1500   LYMPHSABS 2.3 12/31/2019 1004   MONOABS 0.9 06/15/2022 1500   EOSABS 0.5 06/15/2022 1500   EOSABS 0.4 12/31/2019 1004   BASOSABS 0.1 06/15/2022 1500   BASOSABS 0.0 12/31/2019 1004    BMET    Component Value Date/Time   NA 140 05/19/2021 1130   NA 140 12/31/2019 1004   K 3.7 05/19/2021 1130   CL 101 05/19/2021 1130   CO2 32 05/19/2021 1130   GLUCOSE 127 (H) 05/19/2021 1130   BUN 18 05/19/2021 1130   BUN 21 12/31/2019 1004   CREATININE 0.90 05/19/2021 1130   CALCIUM 9.4 05/19/2021 1130   GFRNONAA >60 03/23/2020 1140   GFRAA 102 12/31/2019 1004    BNP No results found for: BNP   Imaging:  CUP PACEART REMOTE DEVICE CHECK Result Date: 12/04/2023 ILR summary report received. Battery status  OK. Normal device function. No new symptom, tachy, brady, or pause episodes. No new AF episodes. Monthly summary reports and ROV/PRN. MC, CVRS   Administration History     None          Latest Ref Rng & Units 02/18/2021   11:18 AM 03/15/2020   11:59 AM 01/21/2019    1:47 PM 10/08/2017   11:44 AM  PFT Results  FVC-Pre L 3.43  3.44  3.73  4.03   FVC-Predicted Pre % 69  69  74  79   FVC-Post L 3.46  3.37  3.69  3.68   FVC-Predicted Post % 70  67  73  72   Pre FEV1/FVC % % 69  73  64  71   Post FEV1/FCV % % 69  73  70  72   FEV1-Pre L 2.37  2.51  2.40  2.85   FEV1-Predicted Pre % 66  69  65  77   FEV1-Post L 2.40  2.45  2.58  2.63   DLCO uncorrected ml/min/mmHg 24.33  28.29  25.55  28.61   DLCO UNC% % 85  98  88  73   DLCO corrected ml/min/mmHg 24.33  28.29     DLCO COR %Predicted % 85  98     DLVA Predicted % 98  108  108  83   TLC L 7.35  7.20  7.91  6.82   TLC % Predicted % 91  89  98  84   RV % Predicted % 117  104  139  91     Lab Results  Component Value Date   NITRICOXIDE 22 08/24/2017        Assessment & Plan:   COPD with asthma (HCC) COPD/asthma with significant peripheral eosinophilia and allergic phenotype. Not in acute exacerbation. High symptom burden. Will see if we can get him restart on Dupixent . Enrollment paperwork completed today. Suspect component of his cough is related to postnasal drainage from allergy symptoms. Advised him to resume nasal spray regimen. Trigger prevention reviewed. Re-educated on role of rescue therapies. Action plan in place.   Patient Instructions  -Continue Aisupra 1-2 puffs every 4-6 hours or Albuterol  3 mL neb every 6 hours as needed for shortness of breath or wheezing. Notify if symptoms persist despite rescue inhaler/neb use. -Continue Breztri  2 puffs Twice daily. Brush tongue and rinse mouth well afterwards. Use with spacer.  -Continue on supplemental oxygen  2 lpm at night -Continue Mucinex  600 mg Twice daily as needed  for chest congestion -Restart Astelin  nasal spray 2 sprays each nostril Twice daily as needed for sinus congestion or nasal drainage  -Restart flonase 2 spray each nostril daily -Restart saline nasal spray 1 spray each nostril 1-2 times a day  -Continue Xyzal  (levocetirizine) 10 mg daily for allergies in the morning -Restart Singulair  (montelukast ) 10 mg At bedtime   We will have you resume Dupixent , if we can get it covered by insurance/patient assistance. You completed the paperwork today and someone should reach out to you to get you restarted on this.    Follow up in 8 weeks with Dr. Geronimo or Izetta Malachy PIETY. If symptoms do not improve or worsen, please contact office for sooner follow up or seek emergency care   Allergic rhinitis See above. He will resume nasal sprays and continue xyzal  and singulair      I spent 42 minutes of dedicated to the care of this patient on the date of this encounter to include pre-visit review of records, face-to-face time with the patient discussing conditions above, post visit ordering of testing, clinical documentation with the electronic health record, making appropriate referrals as documented, and communicating necessary findings to members of the patients care team.  Comer LULLA Malachy, NP 12/28/2023  Pt aware and understands NP's role.

## 2023-12-28 ENCOUNTER — Encounter: Payer: Self-pay | Admitting: Nurse Practitioner

## 2023-12-28 NOTE — Assessment & Plan Note (Signed)
 See above. He will resume nasal sprays and continue xyzal  and singulair 

## 2023-12-28 NOTE — Assessment & Plan Note (Signed)
 COPD/asthma with significant peripheral eosinophilia and allergic phenotype. Not in acute exacerbation. High symptom burden. Will see if we can get him restart on Dupixent . Enrollment paperwork completed today. Suspect component of his cough is related to postnasal drainage from allergy symptoms. Advised him to resume nasal spray regimen. Trigger prevention reviewed. Re-educated on role of rescue therapies. Action plan in place.   Patient Instructions  -Continue Aisupra 1-2 puffs every 4-6 hours or Albuterol  3 mL neb every 6 hours as needed for shortness of breath or wheezing. Notify if symptoms persist despite rescue inhaler/neb use. -Continue Breztri  2 puffs Twice daily. Brush tongue and rinse mouth well afterwards. Use with spacer.  -Continue on supplemental oxygen  2 lpm at night -Continue Mucinex  600 mg Twice daily as needed for chest congestion -Restart Astelin  nasal spray 2 sprays each nostril Twice daily as needed for sinus congestion or nasal drainage  -Restart flonase 2 spray each nostril daily -Restart saline nasal spray 1 spray each nostril 1-2 times a day  -Continue Xyzal  (levocetirizine) 10 mg daily for allergies in the morning -Restart Singulair  (montelukast ) 10 mg At bedtime   We will have you resume Dupixent , if we can get it covered by insurance/patient assistance. You completed the paperwork today and someone should reach out to you to get you restarted on this.    Follow up in 8 weeks with Dr. Geronimo or Izetta Malachy PIETY. If symptoms do not improve or worsen, please contact office for sooner follow up or seek emergency care

## 2023-12-31 ENCOUNTER — Telehealth: Payer: Self-pay

## 2023-12-31 NOTE — Telephone Encounter (Signed)
 Submitted a Prior Authorization request to [facility] ADVANTAGE/RX ADVANCE for DUPIXENT  via CoverMyMeds. Will update once we receive a response.  Key: [id number]  **May be denied because pt has not had any documented exacerbations in the past 12 months that required steroids or led to hospitalization**

## 2024-01-03 ENCOUNTER — Ambulatory Visit (INDEPENDENT_AMBULATORY_CARE_PROVIDER_SITE_OTHER)

## 2024-01-03 ENCOUNTER — Encounter

## 2024-01-03 DIAGNOSIS — I483 Typical atrial flutter: Secondary | ICD-10-CM

## 2024-01-03 LAB — CUP PACEART REMOTE DEVICE CHECK
Date Time Interrogation Session: 20251015231845
Implantable Pulse Generator Implant Date: 20211122

## 2024-01-03 NOTE — Telephone Encounter (Signed)
 Received a fax regarding Prior Authorization from [facility] ADVANTAGE/RX ADVANCE for DUPIXENT . Authorization has been DENIED because patient must have had at least 2 exacerbations where systemic corticosteroids were required at least one OR a COPD-related hospitalization within the past 12 months..  Of note, patient has not met these qualifications. Can attempt appeal. There is no documentation supporting continuation of treatment as patient had received samples last year and failed to follow-up therefore this is considered a new start  Makayla Confer, PharmD, MPH, BCPS, CPP Clinical Pharmacist [facility])

## 2024-01-03 NOTE — Telephone Encounter (Signed)
 Submitted an URGENT Appeal for DUPIXENT  to [facility] ADVANTAGE/RX ADVANCE.   Fax # [phone]  Aleck Puls, PharmD, BCPS, CPP Clinical Pharmacist  [facility]

## 2024-01-07 ENCOUNTER — Ambulatory Visit: Payer: Self-pay | Admitting: Cardiology

## 2024-01-09 NOTE — Telephone Encounter (Signed)
 Received a fax regarding Prior Authorization from [facility] ADVANTAGE/RX ADVANCE for DUPIXENT . Appeal has been DENIED because pt must have had at least 2 exacerbations where systemic corticosteroids were required at least once or COPD-related hospitalization within the past 12 months.  Phone# [phone]

## 2024-01-09 NOTE — Progress Notes (Signed)
 Remote Loop Recorder Transmission

## 2024-01-10 ENCOUNTER — Other Ambulatory Visit: Payer: Self-pay | Admitting: Nurse Practitioner

## 2024-01-11 NOTE — Telephone Encounter (Signed)
 Please notify pt of above. If we have to use steroids, will be able to resubmit. Thanks.

## 2024-01-14 ENCOUNTER — Encounter

## 2024-01-14 NOTE — Telephone Encounter (Signed)
 Patient is aware.NFN

## 2024-01-30 ENCOUNTER — Ambulatory Visit: Admitting: Nurse Practitioner

## 2024-02-03 ENCOUNTER — Encounter

## 2024-02-04 ENCOUNTER — Encounter

## 2024-02-06 ENCOUNTER — Ambulatory Visit: Attending: Cardiology

## 2024-02-06 DIAGNOSIS — I483 Typical atrial flutter: Secondary | ICD-10-CM | POA: Diagnosis not present

## 2024-02-07 ENCOUNTER — Ambulatory Visit: Payer: Self-pay | Admitting: Cardiology

## 2024-02-07 LAB — CUP PACEART REMOTE DEVICE CHECK
Date Time Interrogation Session: 20251118230915
Implantable Pulse Generator Implant Date: 20211122

## 2024-02-08 NOTE — Progress Notes (Signed)
 Remote Loop Recorder Transmission

## 2024-02-14 ENCOUNTER — Encounter

## 2024-02-27 ENCOUNTER — Encounter (INDEPENDENT_AMBULATORY_CARE_PROVIDER_SITE_OTHER): Payer: Self-pay

## 2024-02-27 ENCOUNTER — Encounter: Payer: Self-pay | Admitting: Nurse Practitioner

## 2024-02-27 ENCOUNTER — Ambulatory Visit: Admitting: Nurse Practitioner

## 2024-02-27 VITALS — BP 124/60 | HR 54 | Temp 98.5°F | Ht 76.0 in | Wt 279.2 lb

## 2024-02-27 DIAGNOSIS — J4489 Other specified chronic obstructive pulmonary disease: Secondary | ICD-10-CM | POA: Diagnosis not present

## 2024-02-27 DIAGNOSIS — R490 Dysphonia: Secondary | ICD-10-CM | POA: Diagnosis not present

## 2024-02-27 DIAGNOSIS — G4734 Idiopathic sleep related nonobstructive alveolar hypoventilation: Secondary | ICD-10-CM | POA: Diagnosis not present

## 2024-02-27 DIAGNOSIS — J329 Chronic sinusitis, unspecified: Secondary | ICD-10-CM | POA: Diagnosis not present

## 2024-02-27 DIAGNOSIS — J310 Chronic rhinitis: Secondary | ICD-10-CM | POA: Diagnosis not present

## 2024-02-27 DIAGNOSIS — J449 Chronic obstructive pulmonary disease, unspecified: Secondary | ICD-10-CM

## 2024-02-27 MED ORDER — PREDNISONE 20 MG PO TABS: 20.0000 mg | ORAL_TABLET | Freq: Every day | ORAL | 0 refills | Status: AC

## 2024-02-27 NOTE — Patient Instructions (Addendum)
-  Continue Aisupra 1-2 puffs every 4-6 hours or Albuterol  3 mL neb every 6 hours as needed for shortness of breath or wheezing. Notify if symptoms persist despite rescue inhaler/neb use. -Continue Breztri  2 puffs Twice daily. Brush tongue and rinse mouth well afterwards. Use with spacer.  -Continue on supplemental oxygen  2 lpm at night -Continue Mucinex  600 mg Twice daily as needed for chest congestion -Continue Astelin  nasal spray 2 sprays each nostril Twice daily as needed for sinus congestion or nasal drainage  -Continue flonase  2 spray each nostril daily -Continue saline nasal spray 1 spray each nostril 1-2 times a day  -Continue Xyzal  (levocetirizine) 10 mg daily for allergies in the morning -Continue Singulair  (montelukast ) 10 mg At bedtime - check to make sure you are taking this    Prednisone  20 mg daily for 5 days. Take in AM with food.   Overnight oxygen  study on room air - if you haven't heard something in 2-3 weeks about scheduling this, call the office. If you haven't gotten your results 2-3 weeks after finishing the study, call our office   Referral to Ear, Nose and Throat    Follow up in 4 months with Dr. Geronimo. If symptoms do not improve or worsen, please contact office for sooner follow up or seek emergency care

## 2024-02-27 NOTE — Assessment & Plan Note (Signed)
 COPD/asthma with significant peripheral eosinophilia and allergic phenotype. Slight increase in cough without infectious symptoms; no bronchospasm on exam. High symptom burden at baseline. Will challenge him with prednisone  burst and reassess response. May resubmit for Dupixent  in future, pending steroid usage/response. Suspect component of his cough is related to postnasal drainage from allergy symptoms. Referral to ENT for chronic sinusitis and chronic voice hoarseness. Trigger prevention reviewed. Re-educated on role of rescue therapies. Action plan in place.   Patient Instructions  -Continue Aisupra 1-2 puffs every 4-6 hours or Albuterol  3 mL neb every 6 hours as needed for shortness of breath or wheezing. Notify if symptoms persist despite rescue inhaler/neb use. -Continue Breztri  2 puffs Twice daily. Brush tongue and rinse mouth well afterwards. Use with spacer.  -Continue on supplemental oxygen  2 lpm at night -Continue Mucinex  600 mg Twice daily as needed for chest congestion -Continue Astelin  nasal spray 2 sprays each nostril Twice daily as needed for sinus congestion or nasal drainage  -Continue flonase  2 spray each nostril daily -Continue saline nasal spray 1 spray each nostril 1-2 times a day  -Continue Xyzal  (levocetirizine) 10 mg daily for allergies in the morning -Continue Singulair  (montelukast ) 10 mg At bedtime - check to make sure you are taking this    Prednisone  20 mg daily for 5 days. Take in AM with food.   Overnight oxygen  study on room air - if you haven't heard something in 2-3 weeks about scheduling this, call the office. If you haven't gotten your results 2-3 weeks after finishing the study, call our office   Referral to Ear, Nose and Throat    Follow up in 4 months with Dr. Geronimo. If symptoms do not improve or worsen, please contact office for sooner follow up or seek emergency care

## 2024-02-27 NOTE — Assessment & Plan Note (Deleted)
 Continues to experience high symptom burden.  Significant eosinophilia and allergic phenotype on previous labs.  Started on Singulair  and Xyzal  without significant improvement.  FeNO was normal today.  Did have some minimal evidence of bronchospasm on exam.  Will treat with prednisone  taper today.  Continue triple therapy.  Patient Instructions  -Continue Aisupra 1-2 puffs every 4-6 hours or Albuterol  3 mL neb every 6 hours as needed for shortness of breath or wheezing. Notify if symptoms persist despite rescue inhaler/neb use. -Continue Breztri  2 puffs Twice daily. Brush tongue and rinse mouth well afterwards. Use with spacer.  -Continue on supplemental oxygen  2 lpm at night -Continue Mucinex  600 mg Twice daily as needed for chest congestion -Continue Astelin  nasal spray 2 sprays each nostril Twice daily as needed for sinus congestion or nasal drainage  -Continue flonase  2 spray each nostril daily -Continue saline nasal spray 1 spray each nostril 1-2 times a day  -Continue Xyzal  (levocetirizine) 10 mg daily for allergies in the morning -Continue Singulair  (montelukast ) 10 mg At bedtime - check to make sure you are taking this    Prednisone  20 mg daily for 5 days. Take in AM with food.   Overnight oxygen  study on room air - if you haven't heard something in 2-3 weeks about scheduling this, call the office. If you haven't gotten your results 2-3 weeks after finishing the study, call our office   Referral to Ear, Nose and Throat    Follow up in 4 months with Dr. Geronimo. If symptoms do not improve or worsen, please contact office for sooner follow up or seek emergency care

## 2024-02-27 NOTE — Progress Notes (Signed)
 @Patient  ID: Noah Huffman, male    DOB: [birth year], 83 y.o.   MRN: [id number]  Chief Complaint  Patient presents with   COPD    F/U Pt states he is about the same. Pt states he has not been using his oxygen , but doesn't know why.    Referring provider: Onita Rush, MD  HPI: 83 year old male, former smoker (62.5-pack-year history) followed for COPD Gold 3, chronic cough, nocturnal hypoxia.  He is a patient of Dr. Reeves and also followed by Dr. Neysa for sleep.  Last seen in office on 12/27/2023.  Past medical history significant for history of asthma, hypertension, A-fib, GERD, OA, BPH, obesity, HLD.  TEST/EVENTS:  04/27/2013 and PSG: AHI 16.5/h, desaturation 87% 01/30/2020 HRCT chest: Atherosclerosis, CAD.  Mild diffuse bilateral bronchial wall thickening.  Mild band appearing scarring and volume loss of bases, left greater than right; similar in appearance when compared to previous. 09/03/2019 : On 2 L/min, showed sustained hypoxemia and bradycardia 12/24/2020 NPSG: AHI 9/h, desaturation 85% 02/18/2021 PFTs: FVC 3.46 (70), FEV1 2.4 (67, ratio 69, TLC 91%, DLCO uncorrected 85%.  Moderate obstructive airway disease with normal diffusion capacity.  No BD 11/01/2021 HRCT chest: atherosclerosis. Ascending aorta measures up to 4.2 cm. Enlarged pulmonic trunk. No ILD. Mild basilar scarring. Scattered mucoid impaction in the lower lobes. 3 mm nodule in the subpleural aspect of the superior segment RLL, unchanged. New 3 mm subpleural left upper lobe nodule, new. Mild air trapping.  04/05/2022: eos 800 12/27/2023 FeNO 22 ppb  06/30/2022: OV with Dr. Geronimo. Peripheral eosinophilia. No significant cough but sputum production significant that yellow in color. Last visit, gave him 3% saline nebulizer but has not gotten this yet. Wants to know if biologic therapy would help his hoarseness. Willing to start. Will complete paperwork for Fasenra. Referral to ENT for voice hoarseness.   12/27/2023: Noah Huffman  with Michaela Shankel NP  Noah Huffman is an 83 year old male with COPD who presents for overdue follow up.  He experiences variable breathing difficulties, with some days being worse than others. He was previously on Dupixent  but only did two injections as he states he did not receive follow-up after completing it. He is currently using Breztri  twice daily and has Airsupra  inhaler, although he has never used them. He does carry his rescue with him. He experiences wheezing often, and uses a nebulizer with either albuterol  when his congestion and phlegm become thick. No recent increase in chest congestion or sputum.  He has a persistent cough with phlegm that varies in color from clear to darker. No hemoptysis or fevers. He has not been using his nasal spray regimen, including Flonase , which previously helped with his symptoms. He has not been taking montelukast  (Singulair ) but is taking levocetirizine (Xyzal ). He describes a sensation of sinus congestion similar to when he was a child, with his head feeling 'all stopped up' all the time.    02/27/2024: Today - follow up Discussed the use of AI scribe software for clinical note transcription with the patient, who gave verbal consent to proceed.  History of Present Illness  Noah Huffman is an 83 year old male with chronic respiratory issues who presents for evaluation of persistent cough and mucus production.  He has been experiencing a slight increase in coughing and mucus production, describing it as 'coughing up a lot of mucus at times' and being hoarse periodically. His mucus is clear. No fevers, hemoptysis, chills, anorexia, weight loss. His breathing  has remained stable. Doesn't feel like he has many issues with shortness of breath. He does have some occasional wheezing.   He previously tried Dupixent  samples for a few months, which he found helpful, but insurance denied coverage. He is currently not on biologic and has not been on  steroids recently.  He uses a mouth moisturizer at night, which has helped reduce dry mouth in the morning. He is using his nasal sprays consistently. He denies using his rescue inhaler, Air Supra. He uses his Breztri  twice a day.  He has not seen an Ear, Nose, and Throat specialist in a few years.   He's not using his oxygen  at night. He's not sure why or exactly when he stopped it.     Allergies  Allergen Reactions   Macrobid [Nitrofurantoin]    Morphine And Codeine Rash    Immunization History  Administered Date(s) Administered   Fluad Quad(high Dose 65+) 12/02/2018, 12/24/2019   Influenza Split 04/13/2011, 03/20/2012, 04/18/2012   Influenza, Quadrivalent, Recombinant, Inj, Pf 12/24/2019, 04/07/2021   Influenza,inj,Quad PF,6+ Mos 01/14/2017   Influenza,inj,quad, With Preservative 12/11/2018   PFIZER(Purple Top)SARS-COV-2 Vaccination 05/15/2019, 06/10/2019   Pneumococcal Conjugate-13 02/28/2013, 04/21/2013, 05/06/2015   Pneumococcal Polysaccharide-23 01/23/2007, 05/09/2016, 06/06/2022   Td 03/21/2004, 08/25/2013, 08/25/2013   Td (Adult) 08/25/2013   Unspecified SARS-COV-2 Vaccination 05/21/2019    Past Medical History:  Diagnosis Date   Allergy    seasonal   Asthma    Atrial fibrillation (HCC)    BPH (benign prostatic hypertrophy) with urinary obstruction    Cataract    removed both eyes   COPD (chronic obstructive pulmonary disease) (HCC)    Diverticulosis    Dysrhythmia    GERD (gastroesophageal reflux disease)    Hyperglycemia    Hyperlipidemia    Hypertension    Inguinal hernia    Neuromuscular disorder (HCC)    neuropathy in feet    Obesity    OSA (obstructive sleep apnea) 06/06/2013   Osteoarthritis    Sleep apnea    no cpap    Tobacco History: Social History   Tobacco Use  Smoking Status Former   Current packs/day: 0.00   Average packs/day: 2.5 packs/day for 25.0 years (62.5 ttl pk-yrs)   Types: Cigarettes   Start date: 03/21/1963   Quit date:  03/20/1988   Years since quitting: 35.9  Smokeless Tobacco Never   Counseling given: Not Answered   Outpatient Medications Prior to Visit  Medication Sig Dispense Refill   Albuterol -Budesonide  (AIRSUPRA ) 90-80 MCG/ACT AERO Inhale 1-2 puffs into the lungs every 4 (four) hours as needed (shortness of breath, wheezing, chest tightness). 10.7 g 5   aspirin  EC 81 MG tablet Take 1 tablet (81 mg total) by mouth daily. Swallow whole. 90 tablet 3   azelastine  (ASTELIN ) 0.1 % nasal spray Place 2 sprays into both nostrils 2 (two) times daily. Use in each nostril as directed 30 mL 5   budesonide -glycopyrrolate-formoterol  (BREZTRI  AEROSPHERE) 160-9-4.8 MCG/ACT AERO inhaler INHALE 2 PUFFS INTO THE LUNGS TWICE DAILY 10.7 g 6   fluticasone  (FLONASE ) 50 MCG/ACT nasal spray Place 2 sprays into both nostrils daily. 18.2 mL 5   gabapentin (NEURONTIN) 600 MG tablet Take 1,800 mg by mouth 3 (three) times daily.     levocetirizine (XYZAL ) 5 MG tablet TAKE 1 TABLET(5 MG) BY MOUTH EVERY EVENING 90 tablet 1   losartan-hydrochlorothiazide (HYZAAR) 100-25 MG tablet Take 1 tablet by mouth daily.     montelukast  (SINGULAIR ) 10 MG tablet Take 1 tablet (  10 mg total) by mouth at bedtime. 30 tablet 11   Multiple Vitamins-Minerals (PRESERVISION AREDS 2) CAPS Take 1 capsule by mouth daily.      polyvinyl alcohol  (LIQUIFILM TEARS) 1.4 % ophthalmic solution Place 1 drop into both eyes 2 (two) times daily.     rosuvastatin (CRESTOR) 10 MG tablet Take 10 mg by mouth at bedtime.     Spacer/Aero-Holding Chambers (AEROCHAMBER MV) inhaler Use as instructed 1 each 0   tamsulosin (FLOMAX) 0.4 MG CAPS capsule Take 0.4 mg by mouth daily.     Dupilumab  (DUPIXENT ) 300 MG/2ML SOPN Inject 600mg  into the skin at Day 0 and 300mg  every 14 days thereafter (Patient not taking: Reported on 02/27/2024) 20 mL 0   guaiFENesin  (MUCINEX ) 600 MG 12 hr tablet Take 1 tablet (600 mg total) by mouth 2 (two) times daily. (Patient not taking: Reported on  02/27/2024) 60 tablet 3   sodium chloride  (OCEAN) 0.65 % SOLN nasal spray Place 1 spray into both nostrils daily. (Patient not taking: Reported on 02/27/2024)     sodium chloride  HYPERTONIC 3 % nebulizer solution Take by nebulization in the morning and at bedtime. Dx: J47.9 (Patient not taking: Reported on 02/27/2024) 750 mL 12   Facility-Administered Medications Prior to Visit  Medication Dose Route Frequency Provider Last Rate Last Admin   0.9 %  sodium chloride  infusion  500 mL Intravenous Once Abran Norleen SAILOR, MD       albuterol  (PROVENTIL ) (2.5 MG/3ML) 0.083% nebulizer solution 2.5 mg  2.5 mg Nebulization Q6H PRN Chae Shuster V, NP       methylPREDNISolone  acetate (DEPO-MEDROL ) injection 80 mg  80 mg Intramuscular Once Bernardette Waldron, Comer GAILS, NP         Review of Systems: as above    Physical Exam:  BP 124/60   Pulse (!) 54   Temp 98.5 F (36.9 C)   Ht 6' 4 (1.93 m) Comment: Per pt  Wt 279 lb 3.2 oz (126.6 kg)   SpO2 95% Comment: RA  BMI 33.99 kg/m   GEN: Pleasant, interactive, well-appearing; obese; in no acute distress. HEENT:  Normocephalic and atraumatic. PERRLA.Sclera white. Nasal turbinates boggy, moist and patent bilaterally.  No rhinorrhea present. Oropharynx pink and moist, without exudate or edema. No lesions, ulcerations.  NECK:  Supple w/ fair ROM. No JVD present.Thyroid  symmetrical with no goiter or nodules palpated. Cervical lymphadenopathy.   CV: RRR, no m/r/g, no peripheral edema. Pulses intact, +2 bilaterally. No cyanosis, pallor or clubbing. PULMONARY:  Unlabored, regular breathing. Diminished bilaterally A&P w/o wheezes/rales/rhonchi. No accessory muscle use. No dullness to percussion. GI: BS present and normoactive. Soft, non-tender to palpation. MSK: No erythema, warmth or tenderness. Cap refil <2 sec all extrem. Neuro: A/Ox3. No focal deficits noted.   Skin: Warm, no lesions or rashe Psych: Normal affect and behavior. Judgement and thought content  appropriate.     Lab Results:  CBC    Component Value Date/Time   WBC 9.2 06/15/2022 1500   RBC 4.37 06/15/2022 1500   HGB 14.0 06/15/2022 1500   HGB 14.1 12/31/2019 1004   HCT 40.7 06/15/2022 1500   HCT 41.9 12/31/2019 1004   PLT 221.0 06/15/2022 1500   PLT 204 12/31/2019 1004   MCV 93.3 06/15/2022 1500   MCV 91 12/31/2019 1004   MCH 31.4 03/23/2020 1140   MCHC 34.3 06/15/2022 1500   RDW 14.6 06/15/2022 1500   RDW 14.6 12/31/2019 1004   LYMPHSABS 2.2 06/15/2022 1500   LYMPHSABS 2.3 12/31/2019  1004   MONOABS 0.9 06/15/2022 1500   EOSABS 0.5 06/15/2022 1500   EOSABS 0.4 12/31/2019 1004   BASOSABS 0.1 06/15/2022 1500   BASOSABS 0.0 12/31/2019 1004    BMET    Component Value Date/Time   NA 140 05/19/2021 1130   NA 140 12/31/2019 1004   K 3.7 05/19/2021 1130   CL 101 05/19/2021 1130   CO2 32 05/19/2021 1130   GLUCOSE 127 (H) 05/19/2021 1130   BUN 18 05/19/2021 1130   BUN 21 12/31/2019 1004   CREATININE 0.90 05/19/2021 1130   CALCIUM 9.4 05/19/2021 1130   GFRNONAA >60 03/23/2020 1140   GFRAA 102 12/31/2019 1004    BNP No results found for: BNP   Imaging:  CUP PACEART REMOTE DEVICE CHECK Result Date: 02/07/2024 ILR summary report received. Battery status OK. Normal device function. No new symptom, tachy, brady, or pause episodes. No new AF episodes. Monthly summary reports and ROV/PRN ML, CVRS   Administration History     None          Latest Ref Rng & Units 02/18/2021   11:18 AM 03/15/2020   11:59 AM 01/21/2019    1:47 PM 10/08/2017   11:44 AM  PFT Results  FVC-Pre L 3.43  3.44  3.73  4.03   FVC-Predicted Pre % 69  69  74  79   FVC-Post L 3.46  3.37  3.69  3.68   FVC-Predicted Post % 70  67  73  72   Pre FEV1/FVC % % 69  73  64  71   Post FEV1/FCV % % 69  73  70  72   FEV1-Pre L 2.37  2.51  2.40  2.85   FEV1-Predicted Pre % 66  69  65  77   FEV1-Post L 2.40  2.45  2.58  2.63   DLCO uncorrected ml/min/mmHg 24.33  28.29  25.55  28.61   DLCO  [facility]% % 85  98  88  73   DLCO corrected ml/min/mmHg 24.33  28.29     DLCO COR %Predicted % 85  98     DLVA Predicted % 98  108  108  83   TLC L 7.35  7.20  7.91  6.82   TLC % Predicted % 91  89  98  84   RV % Predicted % 117  104  139  91     Lab Results  Component Value Date   NITRICOXIDE 22 08/24/2017        Assessment & Plan:   COPD with asthma (HCC) COPD/asthma with significant peripheral eosinophilia and allergic phenotype. Slight increase in cough without infectious symptoms; no bronchospasm on exam. High symptom burden at baseline. Will challenge him with prednisone  burst and reassess response. May resubmit for Dupixent  in future, pending steroid usage/response. Suspect component of his cough is related to postnasal drainage from allergy symptoms. Referral to ENT for chronic sinusitis and chronic voice hoarseness. Trigger prevention reviewed. Re-educated on role of rescue therapies. Action plan in place.   Patient Instructions  -Continue Aisupra 1-2 puffs every 4-6 hours or Albuterol  3 mL neb every 6 hours as needed for shortness of breath or wheezing. Notify if symptoms persist despite rescue inhaler/neb use. -Continue Breztri  2 puffs Twice daily. Brush tongue and rinse mouth well afterwards. Use with spacer.  -Continue on supplemental oxygen  2 lpm at night -Continue Mucinex  600 mg Twice daily as needed for chest congestion -Continue Astelin  nasal spray 2 sprays each nostril  Twice daily as needed for sinus congestion or nasal drainage  -Continue flonase  2 spray each nostril daily -Continue saline nasal spray 1 spray each nostril 1-2 times a day  -Continue Xyzal  (levocetirizine) 10 mg daily for allergies in the morning -Continue Singulair  (montelukast ) 10 mg At bedtime - check to make sure you are taking this    Prednisone  20 mg daily for 5 days. Take in AM with food.   Overnight oxygen  study on room air - if you haven't heard something in 2-3 weeks about scheduling this,  call the office. If you haven't gotten your results 2-3 weeks after finishing the study, call our office   Referral to Ear, Nose and Throat    Follow up in 4 months with Dr. Geronimo. If symptoms do not improve or worsen, please contact office for sooner follow up or seek emergency care   Nocturnal hypoxia Previously on 2 lpm supplemental O2. Will repeat ONO on room air and determine necessity of use. Goal >88-90%. No exertional O2 requirements in past.   Chronic rhinitis Continue aggressive maintenance regimen. Refer to ENT.      I spent 42 minutes of dedicated to the care of this patient on the date of this encounter to include pre-visit review of records, face-to-face time with the patient discussing conditions above, post visit ordering of testing, clinical documentation with the electronic health record, making appropriate referrals as documented, and communicating necessary findings to members of the patients care team.  Comer LULLA Rouleau, NP 02/27/2024  Pt aware and understands NP's role.

## 2024-02-27 NOTE — Assessment & Plan Note (Signed)
 Continue aggressive maintenance regimen. Refer to ENT.

## 2024-02-27 NOTE — Assessment & Plan Note (Signed)
 Previously on 2 lpm supplemental O2. Will repeat ONO on room air and determine necessity of use. Goal >88-90%. No exertional O2 requirements in past.

## 2024-03-05 ENCOUNTER — Encounter

## 2024-03-05 ENCOUNTER — Ambulatory Visit: Payer: Self-pay | Admitting: Nurse Practitioner

## 2024-03-05 NOTE — Progress Notes (Signed)
 ONO on room air showed he spent 2 hr and 55 min less than or equal to 88% with low 81% and average 89%. Needs to resume 2 lpm supplemental O2 at night. Thanks.

## 2024-03-08 ENCOUNTER — Ambulatory Visit

## 2024-03-08 DIAGNOSIS — I483 Typical atrial flutter: Secondary | ICD-10-CM | POA: Diagnosis not present

## 2024-03-10 LAB — CUP PACEART REMOTE DEVICE CHECK
Date Time Interrogation Session: 20251219231309
Implantable Pulse Generator Implant Date: 20211122

## 2024-03-10 NOTE — Progress Notes (Signed)
 Remote Loop Recorder Transmission

## 2024-03-11 ENCOUNTER — Telehealth: Payer: Self-pay

## 2024-03-11 NOTE — Telephone Encounter (Signed)
 Lm for patient.

## 2024-03-11 NOTE — Telephone Encounter (Signed)
 Will await flu/COVID test results. If negative, can consider abx. Thanks.

## 2024-03-11 NOTE — Telephone Encounter (Signed)
 Called and spoke to patient. He stated that he completed course of prednisone  with some improvement in sx.  Sx worsened around day 4 of completing prednisone . C/o sore throat, prod cough with tan sputum and body aches x4d. Denies f/c/s or additional sx.  SOB is baseline. He is using Breztri  BID. He has  not used albuterol .  No recent covid or flu test. He does has a test at home. He will test and call back with results.    Katie, please advise. Thanks

## 2024-03-11 NOTE — Telephone Encounter (Signed)
 Patient is calling back because he left message this morning that im not feeling well reporting to [employer] as she told me too. Spoke to triage nurse and was told to call back after he takes his covid flu test with results . Calling cal to see if anyone Is available no one is available . Please give patient a call concerning the results   [phone]

## 2024-03-11 NOTE — Telephone Encounter (Signed)
 Copied from CRM [id number]. Topic: Clinical - Medical Advice >> Mar 10, 2024  1:03 PM Devaughn RAMAN wrote: Reason for CRM: Pt calling in regarding Prednisone  20 mg medication. Pt stated he was advised to f/u after he finished the medication. Pt stated he still has a cough, phlegm, stopped up throat in the mornings.   Called and spoke with the pt and he has not tested yet due to test at home being old. Pt will call back with test results.

## 2024-03-12 ENCOUNTER — Other Ambulatory Visit (HOSPITAL_BASED_OUTPATIENT_CLINIC_OR_DEPARTMENT_OTHER): Payer: Self-pay

## 2024-03-12 ENCOUNTER — Telehealth: Payer: Self-pay

## 2024-03-12 MED ORDER — AMOXICILLIN-POT CLAVULANATE 875-125 MG PO TABS: 1.0000 | ORAL_TABLET | Freq: Two times a day (BID) | ORAL | 0 refills | Status: AC

## 2024-03-12 NOTE — Telephone Encounter (Signed)
 Pt is aware augmentin  sent to pharmacy. Nothing further needed.

## 2024-03-12 NOTE — Telephone Encounter (Signed)
 Copied from CRM [id number]. Topic: Clinical - Medical Advice >> Mar 11, 2024  4:08 PM Rozanna MATSU wrote: Pt stated his test results were all negative, advised him that someone will call him back >> Mar 11, 2024  4:01 PM Rozanna G wrote: Pt returning call to clinic, attempted several times no answer.    Please advise pts results were negative.  Per Izetta previous message, abx may be needed  Routing to DOD, Candis please advise.

## 2024-03-12 NOTE — Telephone Encounter (Signed)
 I sent a prescription in for an antibiotic to his listed Pharmacy- [facility] on [address]

## 2024-03-12 NOTE — Telephone Encounter (Signed)
 Copied from CRM #[id number]. Topic: General - Other >> Mar 11, 2024 11:49 AM Devaughn RAMAN wrote: Reason for CRM: Ky with [facility] called to speak with Warren regarding pt, she would like callback at [phone] Option 1.   ATC x1. Unable to leave vm due to Holiday Hours.

## 2024-03-16 ENCOUNTER — Ambulatory Visit: Payer: Self-pay | Admitting: Cardiology

## 2024-03-16 ENCOUNTER — Other Ambulatory Visit: Payer: Self-pay | Admitting: Nurse Practitioner

## 2024-03-16 DIAGNOSIS — J3089 Other allergic rhinitis: Secondary | ICD-10-CM

## 2024-03-16 DIAGNOSIS — J449 Chronic obstructive pulmonary disease, unspecified: Secondary | ICD-10-CM

## 2024-03-17 ENCOUNTER — Encounter

## 2024-04-02 ENCOUNTER — Ambulatory Visit (INDEPENDENT_AMBULATORY_CARE_PROVIDER_SITE_OTHER): Admitting: Otolaryngology

## 2024-04-02 ENCOUNTER — Encounter (INDEPENDENT_AMBULATORY_CARE_PROVIDER_SITE_OTHER): Payer: Self-pay | Admitting: Otolaryngology

## 2024-04-02 ENCOUNTER — Other Ambulatory Visit (INDEPENDENT_AMBULATORY_CARE_PROVIDER_SITE_OTHER): Payer: Self-pay | Admitting: Otolaryngology

## 2024-04-02 VITALS — BP 113/66 | HR 73 | Ht 76.0 in | Wt 270.0 lb

## 2024-04-02 DIAGNOSIS — R490 Dysphonia: Secondary | ICD-10-CM

## 2024-04-02 DIAGNOSIS — K219 Gastro-esophageal reflux disease without esophagitis: Secondary | ICD-10-CM

## 2024-04-02 MED ORDER — PANTOPRAZOLE SODIUM 40 MG PO TBEC: 40.0000 mg | DELAYED_RELEASE_TABLET | Freq: Two times a day (BID) | ORAL | 1 refills | Status: AC

## 2024-04-02 NOTE — Progress Notes (Signed)
 Reason for Consult: Hoarseness Referring Physician: Dr. Malachy Helayne JONELLE Noah Huffman is an 84 y.o. male.  HPI: Here for evaluation of hoarseness.  He has had hoarseness for more than a year and perhaps a couple of years.  It is a raspy voice.  He does not feel any pain.  No dysphagia or dyne aphasia.  No sore throat.  He does have fluctuation where sometimes his voice is almost back to normal.  He has a significant use of inhalers for his COPD which are required.  He has not been on any reflux medication.  He does not have heartburn but does have mucus, throat clearing, hoarseness as stated, and a cough.  Past Medical History:  Diagnosis Date   Allergy    seasonal   Asthma    Atrial fibrillation (HCC)    BPH (benign prostatic hypertrophy) with urinary obstruction    Cataract    removed both eyes   COPD (chronic obstructive pulmonary disease) (HCC)    Diverticulosis    Dysrhythmia    GERD (gastroesophageal reflux disease)    Hyperglycemia    Hyperlipidemia    Hypertension    Inguinal hernia    Neuromuscular disorder (HCC)    neuropathy in feet    Obesity    OSA (obstructive sleep apnea) 06/06/2013   Osteoarthritis    Sleep apnea    no cpap    Past Surgical History:  Procedure Laterality Date   A-FLUTTER ABLATION N/A 01/02/2020   Procedure: A-FLUTTER ABLATION;  Surgeon: Cindie Ole DASEN, MD;  Location: [facility];  Service: Cardiovascular;  Laterality: N/A;   APPENDECTOMY     arthroscopic knee surgery     CATARACT EXTRACTION  2012   bilateral   COLONOSCOPY     HAMMER TOE SURGERY     INGUINAL HERNIA REPAIR     right   POLYPECTOMY     REVERSE SHOULDER ARTHROPLASTY Right 03/26/2020   Procedure: REVERSE SHOULDER ARTHROPLASTY;  Surgeon: Kay Kemps, MD;  Location: [facility];  Service: Orthopedics;  Laterality: Right;  interscalane block   TONSILLECTOMY AND ADENOIDECTOMY     TOTAL HIP ARTHROPLASTY  2007   right    Family History  Problem Relation Age of Onset   Leukemia  Father    Transient ischemic attack Father    Pneumonia Father    Colon cancer Mother 41   Brain cancer Brother    Pulmonary fibrosis Brother    Colon polyps Neg Hx    Esophageal cancer Neg Hx    Rectal cancer Neg Hx    Stomach cancer Neg Hx     Social History:  reports that he quit smoking about 36 years ago. His smoking use included cigarettes. He started smoking about 61 years ago. He has a 62.5 pack-year smoking history. He has never used smokeless tobacco. He reports current alcohol  use of about 1.0 standard drink of alcohol  per week. He reports that he does not use drugs.  Allergies: Allergies[1]   No results found for this or any previous visit (from the past 48 hours).  No results found.  ROS Blood pressure (!) 165/68, pulse 73, height 6' 4 (1.93 m), weight 270 lb (122.5 kg), SpO2 95%. Physical Exam Constitutional:      Appearance: Normal appearance.  HENT:     Head: Normocephalic and atraumatic.     Right Ear: Tympanic membrane is without lesions and middle ear aerated, ear canal and external ear normal.     Left  Ear: Tympanic membrane is without lesions and middle ear aerated, ear canal and external ear normal.     Nose: Nose without deviation of septum.  Turbinates with mild hypertrophy, No significant swelling or masses.     Oral cavity/oropharynx: Mucous membranes are moist. No lesions or masses    Larynx: Raspy voice. Mirror attempted without success    Eyes:     Extraocular Movements: Extraocular movements intact.     Conjunctiva/sclera: Conjunctivae normal.     Pupils: Pupils are equal, round, and reactive to light.  Cardiovascular:     Rate and Rhythm: Normal rate.  Pulmonary:     Effort: Pulmonary effort is normal.  Musculoskeletal:     Cervical back: Normal range of motion and neck supple. No rigidity.  Lymphadenopathy:     Cervical: No cervical adenopathy or masses.salivary glands without lesions. .     Salivary glands- no mass or  swelling Neurological:     Mental Status: He is alert. CN 2-12 intact. No nystagmus      Assessment/Plan: Hoarseness-we talked about a fiberoptic exam today.  We also discussed reflux as a cause for this problem that adds to his inhalers.  Certainly the inhalers have a large percentage of the issue.  After discussion he would like to treat for reflux first see him back in 1 month and then decide about the fiberoptic exam.  We discussed the issues with lesions of the vocal cords that can be causing hoarseness and he does not feel like that is an issue but will come back for the exam.  Norleen Notice 04/02/2024, 10:19 AM        [1]  Allergies Allergen Reactions   Macrobid [Nitrofurantoin]    Morphine And Codeine Rash

## 2024-04-05 ENCOUNTER — Encounter

## 2024-04-08 ENCOUNTER — Ambulatory Visit

## 2024-04-08 DIAGNOSIS — I483 Typical atrial flutter: Secondary | ICD-10-CM

## 2024-04-09 LAB — CUP PACEART REMOTE DEVICE CHECK
Date Time Interrogation Session: 20260119230418
Implantable Pulse Generator Implant Date: 20211122

## 2024-04-11 NOTE — Progress Notes (Signed)
 Remote Loop Recorder Transmission

## 2024-04-14 ENCOUNTER — Ambulatory Visit: Payer: Self-pay | Admitting: Cardiology

## 2024-04-17 ENCOUNTER — Encounter

## 2024-04-30 ENCOUNTER — Ambulatory Visit (INDEPENDENT_AMBULATORY_CARE_PROVIDER_SITE_OTHER): Admitting: Otolaryngology

## 2024-05-09 ENCOUNTER — Ambulatory Visit

## 2024-05-19 ENCOUNTER — Encounter

## 2024-06-09 ENCOUNTER — Ambulatory Visit

## 2024-06-30 ENCOUNTER — Ambulatory Visit: Admitting: Internal Medicine

## 2024-07-10 ENCOUNTER — Ambulatory Visit

## 2024-08-10 ENCOUNTER — Ambulatory Visit

## 2024-09-10 ENCOUNTER — Ambulatory Visit

## 2024-10-11 ENCOUNTER — Ambulatory Visit

## 2024-11-11 ENCOUNTER — Ambulatory Visit

## 2024-12-12 ENCOUNTER — Ambulatory Visit

## 2025-01-12 ENCOUNTER — Ambulatory Visit

## 2025-02-12 ENCOUNTER — Ambulatory Visit

## 2025-03-15 ENCOUNTER — Ambulatory Visit
